# Patient Record
Sex: Male | Born: 1950 | Race: White | Hispanic: No | Marital: Married | Smoking: Former smoker
Health system: Southern US, Community
[De-identification: ages and names within clinical notes are randomized; demographics above are authoritative.]

## PROBLEM LIST (undated history)

## (undated) DIAGNOSIS — Z95820 Peripheral vascular angioplasty status with implants and grafts: Secondary | ICD-10-CM

## (undated) DIAGNOSIS — I739 Peripheral vascular disease, unspecified: Secondary | ICD-10-CM

## (undated) DIAGNOSIS — E785 Hyperlipidemia, unspecified: Secondary | ICD-10-CM

## (undated) DIAGNOSIS — I2510 Atherosclerotic heart disease of native coronary artery without angina pectoris: Secondary | ICD-10-CM

## (undated) DIAGNOSIS — I219 Acute myocardial infarction, unspecified: Secondary | ICD-10-CM

## (undated) DIAGNOSIS — I10 Essential (primary) hypertension: Secondary | ICD-10-CM

## (undated) HISTORY — DX: Atherosclerotic heart disease of native coronary artery without angina pectoris: I25.10

## (undated) HISTORY — DX: Peripheral vascular disease, unspecified: I73.9

## (undated) HISTORY — DX: Hyperlipidemia, unspecified: E78.5

---

## 2000-03-01 ENCOUNTER — Encounter: Payer: Self-pay | Admitting: Family Medicine

## 2000-03-01 ENCOUNTER — Encounter: Admission: RE | Admit: 2000-03-01 | Discharge: 2000-03-01 | Payer: Self-pay | Admitting: Family Medicine

## 2001-04-14 ENCOUNTER — Encounter: Payer: Self-pay | Admitting: Emergency Medicine

## 2001-04-14 ENCOUNTER — Emergency Department (HOSPITAL_COMMUNITY): Admission: EM | Admit: 2001-04-14 | Discharge: 2001-04-14 | Payer: Self-pay | Admitting: Emergency Medicine

## 2002-03-11 ENCOUNTER — Inpatient Hospital Stay (HOSPITAL_COMMUNITY): Admission: EM | Admit: 2002-03-11 | Discharge: 2002-03-14 | Payer: Self-pay | Admitting: Emergency Medicine

## 2002-03-11 ENCOUNTER — Encounter: Payer: Self-pay | Admitting: Cardiovascular Disease

## 2002-03-11 HISTORY — PX: CARDIAC CATHETERIZATION: SHX172

## 2002-09-19 ENCOUNTER — Emergency Department (HOSPITAL_COMMUNITY): Admission: EM | Admit: 2002-09-19 | Discharge: 2002-09-19 | Payer: Self-pay | Admitting: Emergency Medicine

## 2004-08-25 HISTORY — PX: OTHER SURGICAL HISTORY: SHX169

## 2006-10-24 ENCOUNTER — Emergency Department (HOSPITAL_COMMUNITY): Admission: EM | Admit: 2006-10-24 | Discharge: 2006-10-24 | Payer: Self-pay | Admitting: Emergency Medicine

## 2006-12-09 ENCOUNTER — Ambulatory Visit (HOSPITAL_COMMUNITY): Admission: RE | Admit: 2006-12-09 | Discharge: 2006-12-09 | Payer: Self-pay | Admitting: General Surgery

## 2006-12-09 ENCOUNTER — Encounter (INDEPENDENT_AMBULATORY_CARE_PROVIDER_SITE_OTHER): Payer: Self-pay | Admitting: Specialist

## 2007-06-13 ENCOUNTER — Emergency Department (HOSPITAL_COMMUNITY): Admission: EM | Admit: 2007-06-13 | Discharge: 2007-06-13 | Payer: Self-pay | Admitting: Emergency Medicine

## 2010-08-17 ENCOUNTER — Encounter: Payer: Self-pay | Admitting: Chiropractic Medicine

## 2010-12-09 NOTE — Op Note (Signed)
NAMESULLIVAN, JACUINDE              ACCOUNT NO.:  [id number]   MEDICAL RECORD NO.:  [id number]          PATIENT TYPE:  AMB   LOCATION:  DAY                          FACILITY:  [facility]   PHYSICIAN:  Timothy E. Earlene Plater, M.D. DATE OF BIRTH:  02-12-1951   DATE OF PROCEDURE:  12/09/2006  DATE OF DISCHARGE:                               OPERATIVE REPORT   PREOPERATIVE DIAGNOSIS:  Cholecystolithiasis.   POSTOPERATIVE DIAGNOSIS:  Cholecystolithiasis.   PROCEDURE:  Laparoscopic cholecystectomy and operative cholangiogram.   SURGEON:  Kendrick Ranch, M.D.   ASSISTANT:  Ovidio Kin, M.D.   ANESTHESIA:  General.   Ms. Grim is otherwise healthy 60 with symptomatic gallstones.  He  wishes to proceed with surgery after careful discussion.  He has had  stable cardiovascular disease.  The patient was seen, identified, and  the permit signed.   He is taken to the operating room, placed supine.  General endotracheal  anesthesia was administered.  The abdomen was clipped, prepped and  draped in the usual fashion.  Marcaine 0.25% with epinephrine was used  prior to each incision.  A vertical infraumbilical incision made.  The  fascia identified, opened the midline.  Peritoneum entered without  complications.  The Hasson catheter placed, tied in place with a #1  Vicryl across the fascia.  The abdomen was insufflated.  General  peritoneoscopy was unremarkable.  A second 10 mm trocar placed in the  midepigastrium, two 5 mm trocars in the right upper quadrant.  The  gallbladder was encased in omental fat and this was taken down bluntly  and carefully.  The gallbladder was grasped, placed on tension.  Its  anatomy could then be determined.  Dissection was carefully done around  the base of the gallbladder through this blanket of fatty tissue  revealing a otherwise normal-appearing cystic duct.  This was dissected  out with a clear window, clip was placed on the gallbladder side of  duct, the duct opened  and a percutaneously passed catheter was placed  into the duct and clipped.  A real-time cholangiogram was made showing  rapid emptying of dye into the duodenum and filling of the biliary tree  without complications.  The clip and catheter removed.  The stump of the  cystic duct triply clipped and divided.  Cystic artery identified,  dissected out, triply clipped and divided.  The gallbladder removed from  the gallbladder bed without complications.  The gallbladder placed in an  EndoCatch and removed through the infraumbilical incision which was tied  and closed under direct vision.  Copious irrigation was carried out, was  clear.  There was no evidence of bleeding or trouble.  All irrigant,  CO2, instruments and trocars removed under direct vision.  Each  wound checked cautery used when necessary and the skin wounds closed  with 3-0 Monocryl.  Steri-Strips applied.  Counts all correct.  The  patient tolerated it well and was removed to recovery room in good  condition.  He will be observed, discharged and followed as an  outpatient.      Timothy E. Earlene Plater, M.D.  Electronically Signed  TED/MEDQ  D:  12/09/2006  T:  12/09/2006  Job:  [id number]   cc:   Nanetta Batty, M.D.  Fax: [phone]   Dr. Theadora Rama

## 2010-12-12 NOTE — Cardiovascular Report (Signed)
NAME:  Jeffrey Norton, Jeffrey Norton NO.:  [id number]   MEDICAL RECORD NO.:  [id number]                   PATIENT TYPE:  INP   LOCATION:  1825                                 FACILITY:  [facility]   PHYSICIAN:  Runell Gess, M.D.             DATE OF BIRTH:  [birth year]   DATE OF PROCEDURE:  DATE OF DISCHARGE:                              CARDIAC CATHETERIZATION   PROCEDURE PERFORMED:  Cardiac catheterization/percutaneous coronary  intervention and stent procedure using AngioJet and temporary transvenous  pacing.   INDICATION:  The patient is a 60 year old married white male, father of four  children without prior cardiac history. He is on no medications. He had seen  Dr. Caprice Kluver approximately 10 years ago. He developed sudden onset of chest  pain one-hour prior to presentation in the [facility] Emergency Room when he  was shoveling mulch. Upon presentation, he had documentation of inferior ST  segment elevation. He was treated with aspirin, Plavix, IV heparin, and  nitroglycerin.  He remained hemodynamically stable but did drop his pressure  with the nitroglycerin. He was brought emergently to the cardiac  catheterization laboratory for angiography and intervention.   DESCRIPTION OF PROCEDURE:  The patient was brought to the second floor [facility] urgently  in the setting of an acute  myocardial infarction.  His right groin was prepped in the usual sterile  fashion.  Then 1% Xylocaine was used for local anesthesia.  A 7 French  sheath was inserted into the right femoral artery using standard Seldinger  technique. A 6 French sheath was inserted into the right femoral vein. A 5  French balloon-tipped temporary transvenous pacemaker was then placed in the  right ventricular outflow tract and capture was documented.  A 6 French  right and left  Judkins diagnostic catheter as well as a 6 French pigtail  catheter were used for  selective coronary angiography, left  ventriculography, and distal abdominal aortography.  Omnipaque dye was used  for the entirety of the case.  Retrograde, aortic, left ventricular, and  pullback pressures were recorded.   HEMODYNAMICS:  1. Aortic systolic pressure 110, diastolic pressure 67.  2. Left ventricular systolic pressure 119, diastolic pressure 19.   SELECTIVE CORONARY ANGIOGRAPHY:  1. Left main:  Normal.  2. Left anterior descending:  The LAD had a fairly focal 60% stenosis in the     midportion after the first diagonal branch.  3. Left circumflex:  This vessel gave off a high marginal branch that was     distally trifurcating in the ramus distribution. There was a 50% proximal     stenosis. It was nondominant.  4. Right coronary artery:  This is the infarcted related vessel and was     subtotally occluded with TIMI-1 to 1/2 flow. There was a long segmental     area of subtotal occlusion in the midportion.   LEFT  VENTRICULOGRAPHY:  RAO left ventriculogram was performed using 20 cc of  Omnipaque dye at 10 cc/sec.  There was moderate inferobasal and mild  anteroapical hypokinesia with compensatory anterior wall hyperkinesia and an  EF in the 50-55% range.   DISTAL ABDOMINAL AORTOGRAPHY:  Distal abdominal aortogram was performed  using  20 cc of Omnipaque dye at 20 cc/sec. x2.  There was a 40% proximal right  renal artery stenosis and a 40% proximal right common iliac artery stenosis.   IMPRESSION:  Acute posterior myocardial infarction with occluded dominant  rate and angiographically documented thrombosis.   PLAN:  We will proceed with PCI and stenting using adjunctive glycoprotein  IIb/IIIa inhibition and AngioJet.   PROCEDURE:  Using a 7 [place] JR4 with side holes guide catheter along with  an 0.014, 190 sport wire and a 2.75 x 15 Maverick balloon, pre-dilatation  was performed restoring antegrade flow with a reperfusion time of two hours.  There was obvious  thrombosis. The patient received a total of 6500 units of  heparin with an ending ACT of 280.  He received aspirin in the ER as well as  Plavix 300 p.o. and Integrilin in double bolus and infusion. The wire was  extended using a dock wire and AngioJet was performed with three passes. The  pacer kicked in.  The patient became hypotensive and bradycardic during the  AngioJet passes. This provided significant angiographic improvement with  thrombosis resolution. Following this, a 3.0 x 23 Cypher drug-eluting stent  was then deployed at 15 atmospheres.  There was some distal spasm relieved  with 200 mcg of intracoronary nitroglycerin. A 3.25 x 20 Quantum Maverick  balloon was then used for post-dilatation at 14 atmospheres resulting in  reduction in a subtotal occlusion to 0% residual with TIMI-3 flow. The  patient tolerated the procedure well.   OVERALL IMPRESSION:  Successful direct percutaneous coronary intervention  and stenting using glycoprotein IIb/IIIa inhibition and AngioJet with a  relief of perfusion time of two hours.   The guide wire and catheter were removed.  The sheaths were sewn securely in  place. The temporary transvenous pacemaker was removed.  Plans will be to  start beta blockers tomorrow. The sheaths will be removed once the ACT falls  below 150. Integrilin will be continued for 24 hours.  The patient will  ultimately be treated with ACE inhibitor, if blood pressure permitting, and  a statin.  He left the lab in stable condition with a stable blood pressure  and sinus rhythm.                                                       Runell Gess, M.D.    JJB/MEDQ  D:  03/11/2002  T:  03/14/2002  Job:  [id number]   cc:   Cardiac Catheterization Lab   Carolyne Fiscal, M.D.

## 2010-12-12 NOTE — H&P (Signed)
NAME:  Jeffrey Norton, Jeffrey Norton.:  [id number]   MEDICAL RECORD NO.:  [id number]                   PATIENT TYPE:   LOCATION:                                       FACILITY:   PHYSICIAN:  Abelino Derrick, P.A.                DATE OF BIRTH:   DATE OF ADMISSION:  03/11/2002  DATE OF DISCHARGE:                                HISTORY & PHYSICAL   CHIEF COMPLAINT:  Chest pain.   HISTORY OF PRESENT ILLNESS:  The patient is a 60 year old male with no prior  history of coronary artery disease or MI, who presents to the emergency room  with substernal chest tightness and an EKG consistent with an acute DMI.  He  had a remote exercise study by Dr. Clarene Duke 10 years ago.  The patient says  that he had symptoms at 11:30 a.m. while shoveling mulch. He was seen in the  emergency room at 12:30 p.m.  He was taken emergently to the catheterization  lab by Dr. [name].   PAST MEDICAL HISTORY:  Remarkable for borderline hypertension, he is on no  medications.  He has no history of diabetes and is unsure of his cholesterol  status.  He has had a remote tonsillectomy, but no other major surgeries or  medical problems.  He has had some diverticulitis and remote peptic ulcer  disease.  He currently takes no medications.   ALLERGIES:  No known drug allergies or shellfish allergy.   SOCIAL HISTORY:  He is an ex-smoker, he quit two years ago.  He is married  and has two children with no grandchildren.  He works in [employer] for [employer]  [employer].   FAMILY HISTORY:  Remarkable for coronary artery disease.  His father had an  MI, but died of cancer in his 66s.  His mother died also in her 58s of MI.  He has one older brother with no history of coronary artery disease.   REVIEW OF SYSTEMS:  Essentially unremarkable except as noted above, he has  not had any bleeding, kidney disease, or thyroid problems.   PHYSICAL EXAMINATION:  VITAL SIGNS:  Blood pressure 102/60, pulse 78,  respirations 16.  GENERAL:  He is a well-developed and well-nourished male in mild to moderate  discomfort.  He was examined in the emergency room.  HEENT:  Normocephalic.  Extraocular movements intact.  Sclerae are  nonicteric. Lids and conjunctivae are within normal limits.  NECK:  Without bruit.  No JVD.  CHEST:  Clear to auscultation and percussion.  HEART:  Regular rate and rhythm without murmurs, rubs, or gallops.  Normal  S1 and S2.  ABDOMEN:  Nontender with no hepatosplenomegaly appreciated.  It is not  distended.  EXTREMITIES: No edema.  Pulses were 2+/4 bilaterally.  NEUROLOGY:  Grossly intact.  He is awake, alert, oriented, and cooperative.  He moves all extremities without obvious deficit.  His EKG in the emergency  room revealed sinus rhythm with inferior ST elevation.   IMPRESSION:  1. Acute diaphragmatic myocardial infarction.  2. Past history of smoking.  3. History of borderline hypertension.  4. Family history of coronary artery disease.   PLAN:  The patient was taken emergently to the catheterization lab by Dr.  [name].  He is treated with aspirin, Plavix, heparin, and nitroglycerin.                                               Abelino Derrick, P.ALenard Lance  D:  03/13/2002  T:  03/15/2002  Job:  [id number]

## 2010-12-12 NOTE — Discharge Summary (Signed)
NAME:  Jeffrey, Norton                        ACCOUNT NO.:  [id number]   MEDICAL RECORD NO.:  [id number]                   PATIENT TYPE:  INP   LOCATION:  2922                                 FACILITY:  [facility]   PHYSICIAN:  Beverly A. Delanna Ahmadi, N.P.               DATE OF BIRTH:  [birth year]   DATE OF ADMISSION:  03/11/2002  DATE OF DISCHARGE:  03/14/2002                                 DISCHARGE SUMMARY   HISTORY OF PRESENT ILLNESS:  The patient is a 60 year old male patient  without a history of coronary artery disease. He came into the ER with  complaints of chest tightness starting while he was shoveling some mulch.  His EKG in the ER was consistent with acute DMI.  He was taken urgently to  the catheterization lab.  His other prior medical history includes  borderline hypertension. He quit smoking two years ago. He is married with  two children.  His initial blood pressure was 102/60, pulse was 80,  respirations were 16.   HOSPITAL COURSE:  He underwent cardiac catheterization by Dr. [name] on  March 11, 2002, emergently. He had an EF of 50 to 55%.  He had an RCA that  was 99% with thrombus and he had a 60% LAD mi and 50% circumflex.  He also  had 40% right renal artery stenosis and 40% iliac stenosis.  He underwent  AngioJet, PCI, stenting with a Cypher stent and Integrilin infusion.  He  became hypertensive, bradycardic, and required IV Atropine and dopamine  during the procedure.  Postprocedure, he did fairly well.  He was kept on  Integrilin for 24 hours.  Aspirin, Plavix, and beta blocker was started and  Altace ACE inhibitor was also started.  He did continue to have some  problems with intermittent sinus bradycardia and also low blood pressure.  His right groin was stable without any problems.  He was seen by cardiac  rehab and walked in the halls without any problems.  He did have one episode  of some triplet PVCs on March 12, 2002, and then none after that.  At the  time  of discharge, I reviewed his medications that he had received.  He  initially was put on Lopressor 25 mg b.i.d.  Apparently, he cannot tell that  he received any of this.  He was then changed to Toprol 50 mg a day.  He  received one dose and his heart rate went down to 49, thus, the morning of  his discharge, the nurse had held his Toprol. Thus we will change him to  Toprol XL 25 mg a day, also his Altace 2.5 mg was apparently started at once  a day and then increased to twice a day, however, he never received it twice  a day because of his blood pressure.   LABORATORY DATA:  Total cholesterol was 152, HDL 27, LDL 96, total  cholesterol  HDL ratio was 5.6, triglycerides 143.  These were performed on  March 12, 2002.  TSH was normal.  On March 13, 2002, hemoglobin 13.6,  hematocrit 39.9, WBC 11, platelets 212.  Sodium 144, potassium 4.5, BUN 11,  creatinine 1.1, and glucose 93.  CK-MB #1 was 1800/113; #2 was 1208/80.6; #3  was 787/31.8.  Troponin #1 6.3, #2 6.7, and #3 4.0.  Chest x-ray is not in  the computer at the time of this dictation.   DISCHARGE MEDICATIONS:  1. Aspirin 81 mg once a day.  2. Plavix 75 mg once a day x3 to 6 months.  3. Protonix 40 mg once per day.  4. Altace 2.5 mg once per day.  5. Zocor 10 mg once per day.  6. Nitroglycerin 0.4 mg if needed.  7. Toprol XL 25 mg once a day.   ACTIVITY:  He is to do no strenuous activity, no work until seen by Dr.  [name].  He should do no driving for a week.  He should be on a low  cholesterol diet.  If he has any problems with his groin, he will give our  office a call.  He will see Dr. [name] on September 4, at 3:30.  He will see  Abelino Derrick, P.A. on March 20, 2002, to see if he is stable to go on a  trip to [place].  Dr. [name] has stated that it was okay for him to go in  one weeks' time as long as he is stable when seen in the office.  His medications and discharge instructions were reviewed with him and his  wife.  Many questions were answered.  He will also be referred to the cardiac  rehab program.   DISCHARGE DIAGNOSES:  1. Acute inferior myocardial infarction, status post emergent     catheterization with AngioJet and stenting to his right coronary artery     with a Cypher stent, positive residual disease in his left anterior     descending 60% mid, and circumflex.  2. Dyslipidemia with low HDL.  He will need to be put on Niaspan as an     outpatient. Dietary measures to try to increase his HDL were discussed at     the time of discharge.  3. Borderline hypertension, now therapeutic on current medications.  4. Sinus bradycardia and hypotension after diaphragmatic myocardial     infarction.  5. History of peptic ulcer disease. The patient has not had a problem for     approximately five years.  He states when he does get discomfort, he     takes Carafate which he states is the only medication that works.  We     will keep him on Protonix as long as he is on the Plavix.                                               Beverly A. Delanna Ahmadi, N.P.    BAB/MEDQ  D:  03/14/2002  T:  03/14/2002  Job:  [id number]   cc:   Carolyne Fiscal, M.D.

## 2011-05-05 LAB — COMPREHENSIVE METABOLIC PANEL
ALT: 20
CO2: 24
Calcium: 9
Creatinine, Ser: 0.88
GFR calc non Af Amer: >60
Glucose, Bld: 96
Total Bilirubin: 1

## 2011-05-05 LAB — URINALYSIS, ROUTINE W REFLEX MICROSCOPIC
Nitrite: NEGATIVE
Protein, ur: NEGATIVE
Urobilinogen, UA: 0.2

## 2011-05-05 LAB — URINE MICROSCOPIC-ADD ON

## 2011-05-05 LAB — CBC
Hemoglobin: 16.6
MCHC: 35.4
MCV: 91.3
RBC: 5.14

## 2011-05-05 LAB — DIFFERENTIAL
Basophils Absolute: 0.1
Eosinophils Absolute: 0 — ABNORMAL LOW
Lymphs Abs: 0.8
Neutrophils Relative %: 89 — ABNORMAL HIGH

## 2011-05-05 LAB — CULTURE, BLOOD (ROUTINE X 2)

## 2011-05-05 LAB — INFLUENZA A+B VIRUS AG-DIRECT(RAPID)

## 2011-09-15 ENCOUNTER — Other Ambulatory Visit: Payer: Self-pay | Admitting: Chiropractic Medicine

## 2011-09-15 DIAGNOSIS — M545 Low back pain: Secondary | ICD-10-CM

## 2011-10-06 ENCOUNTER — Other Ambulatory Visit: Payer: Self-pay

## 2011-10-12 ENCOUNTER — Ambulatory Visit
Admission: RE | Admit: 2011-10-12 | Discharge: 2011-10-12 | Disposition: A | Discharge status: Home / Self Care | Payer: BC Managed Care – PPO | Source: Ambulatory Visit | Attending: Chiropractic Medicine | Admitting: Chiropractic Medicine

## 2011-10-12 DIAGNOSIS — M545 Low back pain: Secondary | ICD-10-CM

## 2011-10-22 ENCOUNTER — Other Ambulatory Visit: Payer: Self-pay | Admitting: Dermatology

## 2012-05-09 ENCOUNTER — Other Ambulatory Visit: Payer: Self-pay | Admitting: Physical Medicine and Rehabilitation

## 2012-05-09 DIAGNOSIS — M545 Low back pain: Secondary | ICD-10-CM

## 2012-06-03 ENCOUNTER — Ambulatory Visit
Admission: RE | Admit: 2012-06-03 | Discharge: 2012-06-03 | Disposition: A | Discharge status: Home / Self Care | Payer: Medicare HMO | Source: Ambulatory Visit | Attending: Physical Medicine and Rehabilitation | Admitting: Physical Medicine and Rehabilitation

## 2012-06-03 DIAGNOSIS — M545 Low back pain: Secondary | ICD-10-CM

## 2012-06-30 ENCOUNTER — Other Ambulatory Visit (HOSPITAL_COMMUNITY): Payer: Self-pay | Admitting: Cardiovascular Disease

## 2012-06-30 DIAGNOSIS — I2510 Atherosclerotic heart disease of native coronary artery without angina pectoris: Secondary | ICD-10-CM

## 2012-06-30 DIAGNOSIS — I739 Peripheral vascular disease, unspecified: Secondary | ICD-10-CM

## 2012-07-04 ENCOUNTER — Other Ambulatory Visit: Payer: Self-pay | Admitting: Cardiovascular Disease

## 2012-07-18 ENCOUNTER — Encounter (HOSPITAL_COMMUNITY): Payer: Self-pay | Admitting: Respiratory Therapy

## 2012-07-19 ENCOUNTER — Ambulatory Visit (HOSPITAL_COMMUNITY)
Admission: RE | Admit: 2012-07-19 | Discharge: 2012-07-19 | Disposition: A | Discharge status: Home / Self Care | Payer: Medicare HMO | Source: Ambulatory Visit | Attending: Cardiovascular Disease | Admitting: Cardiovascular Disease

## 2012-07-19 ENCOUNTER — Encounter (HOSPITAL_COMMUNITY): Payer: Medicare HMO

## 2012-07-19 DIAGNOSIS — R0609 Other forms of dyspnea: Secondary | ICD-10-CM | POA: Insufficient documentation

## 2012-07-19 DIAGNOSIS — I10 Essential (primary) hypertension: Secondary | ICD-10-CM | POA: Insufficient documentation

## 2012-07-19 DIAGNOSIS — Z8249 Family history of ischemic heart disease and other diseases of the circulatory system: Secondary | ICD-10-CM | POA: Insufficient documentation

## 2012-07-19 DIAGNOSIS — I2510 Atherosclerotic heart disease of native coronary artery without angina pectoris: Secondary | ICD-10-CM | POA: Insufficient documentation

## 2012-07-19 DIAGNOSIS — E785 Hyperlipidemia, unspecified: Secondary | ICD-10-CM | POA: Insufficient documentation

## 2012-07-19 DIAGNOSIS — R0989 Other specified symptoms and signs involving the circulatory and respiratory systems: Secondary | ICD-10-CM | POA: Insufficient documentation

## 2012-07-19 DIAGNOSIS — I252 Old myocardial infarction: Secondary | ICD-10-CM | POA: Insufficient documentation

## 2012-07-19 DIAGNOSIS — I739 Peripheral vascular disease, unspecified: Secondary | ICD-10-CM | POA: Insufficient documentation

## 2012-07-19 DIAGNOSIS — Z87891 Personal history of nicotine dependence: Secondary | ICD-10-CM | POA: Insufficient documentation

## 2012-07-19 HISTORY — PX: OTHER SURGICAL HISTORY: SHX169

## 2012-07-19 MED ORDER — AMINOPHYLLINE 25 MG/ML IV SOLN
75.0000 mg | Freq: Once | INTRAVENOUS | Status: AC
  Administered 2012-07-19: 75 mg via INTRAVENOUS

## 2012-07-19 MED ORDER — TECHNETIUM TC 99M SESTAMIBI GENERIC - CARDIOLITE
11.0000 | Freq: Once | INTRAVENOUS | Status: AC | PRN
  Administered 2012-07-19: 11 via INTRAVENOUS

## 2012-07-19 MED ORDER — TECHNETIUM TC 99M SESTAMIBI GENERIC - CARDIOLITE
32.0000 | Freq: Once | INTRAVENOUS | Status: AC | PRN
  Administered 2012-07-19: 32 via INTRAVENOUS

## 2012-07-19 MED ORDER — REGADENOSON 0.4 MG/5ML IV SOLN
0.4000 mg | Freq: Once | INTRAVENOUS | Status: AC
  Administered 2012-07-19: 0.4 mg via INTRAVENOUS

## 2012-07-19 NOTE — Procedures (Addendum)
[facility] [address] [place] [address] [phone]  Cardiology Nuclear Med Study  Jeffrey Norton is a 61 y.o. male     MRN : [id number]     DOB: 10/05/1950  Procedure Date: 07/19/2012  Nuclear Med Background Indication for Stress Test:  Stent Patency History:  CAD, PVD, MI 02/2002 Cardiac Risk Factors: Family History - CAD, History of Smoking, Hypertension and Lipids  Symptoms:  DOE   Nuclear Pre-Procedure Caffeine/Decaff Intake:  7:00pm NPO After: 5:00am   IV Site: R Antecubital  IV 0.9% NS with Angio Cath:  22g  Chest Size (in):  46 IV Started by: Koren Shiver, CNMT  Height: 5\' 9"  (1.753 m)  Cup Size: n/a  BMI:  Body mass index is 29.09 kg/(m^2). Weight:  197 lb (89.359 kg)   Tech Comments:  n/a    Nuclear Med Study 1 or 2 day study: 1 day  Stress Test Type:  Lexiscan  Order Authorizing Provider:  Nanetta Batty, MD   Resting Radionuclide: Technetium 38m Sestamibi  Resting Radionuclide Dose: 11.0 mCi   Stress Radionuclide:  Technetium 38m Sestamibi  Stress Radionuclide Dose: 32.0 mCi           Stress Protocol Rest HR: 65 Stress HR: 106  Rest BP: 211/86 Stress BP: 172/91  Exercise Time (min): n/a METS: n/a   Predicted Max HR: 159 bpm % Max HR: 66.67 bpm Rate Pressure Product: 46962   Dose of Adenosine (mg):  n/a Dose of Lexiscan: 0.4 mg  Dose of Atropine (mg): n/a Dose of Dobutamine: n/a mcg/kg/min (at max HR)  Stress Test Technologist: Esperanza Sheets, CCT Nuclear Technologist: Gonzella Lex, CNMT   Rest Procedure:  Myocardial perfusion imaging was performed at rest 45 minutes following the intravenous administration of Technetium 62m Sestamibi. Stress Procedure:  The patient received IV Lexiscan 0.4 mg over 15-seconds.  Technetium 20m Sestamibi injected at 30-seconds.  There were no significant changes with Lexiscan.  Quantitative spect images were obtained after a 45 minute delay.  Transient  Ischemic Dilatation (Normal <1.22):  1.04 Lung/Heart Ratio (Normal <0.45):  0.41 QGS EDV:  102 ml QGS ESV:  46 ml LV Ejection Fraction: 55%  Signed by      Rest ECG: NSR with non-specific ST-T wave changes  Stress ECG: No significant change from baseline ECG  QPS Raw Data Images:  Normal; no motion artifact; normal heart/lung ratio. Stress Images:  Moderate perfusion defect in the inferior wall and inferior septum Rest Images:  Comparison with the stress images reveals no significant change. Subtraction (SDS):  These findings are consistent with a small previous infarction in the territory of the right coronary artery. No evidence of reversible ischemia.  Impression Exercise Capacity:  Lexiscan with no exercise. BP Response:  Normal blood pressure response. Clinical Symptoms:  No significant symptoms noted. ECG Impression:  No significant ECG changes with Lexiscan. Comparison with Prior Nuclear Study: Direct image comparison shows no change.  Overall Impression:  Low risk stress nuclear study.  LV Wall Motion:  Mild inferobasal hypokinesis. Normal overall left ventricular systolic function.   Thurmon Fair, MD  07/19/2012 12:37 PM

## 2012-08-08 ENCOUNTER — Ambulatory Visit (HOSPITAL_COMMUNITY)
Admission: RE | Admit: 2012-08-08 | Discharge: 2012-08-08 | Disposition: A | Discharge status: Home / Self Care | Payer: Medicare HMO | Source: Ambulatory Visit | Attending: Cardiovascular Disease | Admitting: Cardiovascular Disease

## 2012-08-08 DIAGNOSIS — I739 Peripheral vascular disease, unspecified: Secondary | ICD-10-CM | POA: Insufficient documentation

## 2012-08-08 NOTE — Progress Notes (Signed)
BLE Arterial duplex doppler completed. Jeffrey Norton

## 2012-08-12 ENCOUNTER — Ambulatory Visit
Admission: RE | Admit: 2012-08-12 | Discharge: 2012-08-12 | Disposition: A | Discharge status: Home / Self Care | Payer: 59 | Source: Ambulatory Visit | Attending: Cardiovascular Disease | Admitting: Cardiovascular Disease

## 2012-08-12 ENCOUNTER — Other Ambulatory Visit: Payer: Self-pay | Admitting: Cardiovascular Disease

## 2012-08-12 DIAGNOSIS — Z01811 Encounter for preprocedural respiratory examination: Secondary | ICD-10-CM

## 2012-08-15 ENCOUNTER — Ambulatory Visit (HOSPITAL_COMMUNITY)
Admission: RE | Admit: 2012-08-15 | Discharge: 2012-08-16 | Disposition: A | Discharge status: Home / Self Care | Payer: Managed Care, Other (non HMO) | Source: Ambulatory Visit | Attending: Cardiovascular Disease | Admitting: Cardiovascular Disease

## 2012-08-15 ENCOUNTER — Other Ambulatory Visit (HOSPITAL_COMMUNITY): Payer: Self-pay | Admitting: Cardiology

## 2012-08-15 ENCOUNTER — Encounter (HOSPITAL_COMMUNITY): Payer: Self-pay | Admitting: Cardiology

## 2012-08-15 ENCOUNTER — Encounter (HOSPITAL_COMMUNITY)
Admission: RE | Disposition: A | Discharge status: Home / Self Care | Payer: Self-pay | Source: Ambulatory Visit | Attending: Cardiovascular Disease

## 2012-08-15 DIAGNOSIS — I771 Stricture of artery: Secondary | ICD-10-CM

## 2012-08-15 DIAGNOSIS — I708 Atherosclerosis of other arteries: Secondary | ICD-10-CM | POA: Insufficient documentation

## 2012-08-15 DIAGNOSIS — I2510 Atherosclerotic heart disease of native coronary artery without angina pectoris: Secondary | ICD-10-CM | POA: Diagnosis present

## 2012-08-15 DIAGNOSIS — I252 Old myocardial infarction: Secondary | ICD-10-CM | POA: Insufficient documentation

## 2012-08-15 DIAGNOSIS — I739 Peripheral vascular disease, unspecified: Secondary | ICD-10-CM | POA: Diagnosis present

## 2012-08-15 DIAGNOSIS — I70219 Atherosclerosis of native arteries of extremities with intermittent claudication, unspecified extremity: Secondary | ICD-10-CM | POA: Insufficient documentation

## 2012-08-15 DIAGNOSIS — Z95820 Peripheral vascular angioplasty status with implants and grafts: Secondary | ICD-10-CM

## 2012-08-15 DIAGNOSIS — I10 Essential (primary) hypertension: Secondary | ICD-10-CM | POA: Diagnosis present

## 2012-08-15 DIAGNOSIS — I701 Atherosclerosis of renal artery: Secondary | ICD-10-CM | POA: Diagnosis present

## 2012-08-15 HISTORY — DX: Essential (primary) hypertension: I10

## 2012-08-15 HISTORY — DX: Peripheral vascular angioplasty status with implants and grafts: Z95.820

## 2012-08-15 HISTORY — PX: OTHER SURGICAL HISTORY: SHX169

## 2012-08-15 HISTORY — DX: Acute myocardial infarction, unspecified: I21.9

## 2012-08-15 LAB — POCT ACTIVATED CLOTTING TIME
Activated Clotting Time: 268 seconds
Activated Clotting Time: 160 seconds

## 2012-08-15 SURGERY — ABDOMINAL ANGIOGRAM
Laterality: Bilateral

## 2012-08-15 MED ORDER — ASPIRIN 81 MG PO TABS: 81.0000 mg | ORAL_TABLET | Freq: Every day | ORAL | Status: DC

## 2012-08-15 MED ORDER — SODIUM CHLORIDE 0.9 % IV SOLN
INTRAVENOUS | Status: DC
  Administered 2012-08-15: 11:00:00 via INTRAVENOUS

## 2012-08-15 MED ORDER — SODIUM CHLORIDE 0.9 % IJ SOLN: 3.0000 mL | INTRAMUSCULAR | Status: DC | PRN

## 2012-08-15 MED ORDER — FENTANYL CITRATE 0.05 MG/ML IJ SOLN
INTRAMUSCULAR | Status: AC
  Filled 2012-08-15: qty 2

## 2012-08-15 MED ORDER — HEPARIN SODIUM (PORCINE) 1000 UNIT/ML IJ SOLN
INTRAMUSCULAR | Status: AC
  Filled 2012-08-15: qty 1

## 2012-08-15 MED ORDER — MIDAZOLAM HCL 2 MG/2ML IJ SOLN
INTRAMUSCULAR | Status: AC
  Filled 2012-08-15: qty 2

## 2012-08-15 MED ORDER — ALPRAZOLAM 0.25 MG PO TABS: 0.2500 mg | ORAL_TABLET | Freq: Three times a day (TID) | ORAL | Status: DC | PRN

## 2012-08-15 MED ORDER — LIDOCAINE HCL (PF) 1 % IJ SOLN
INTRAMUSCULAR | Status: AC
  Filled 2012-08-15: qty 30

## 2012-08-15 MED ORDER — SODIUM CHLORIDE 0.9 % IV SOLN
INTRAVENOUS | Status: AC
  Administered 2012-08-15: 15:00:00 via INTRAVENOUS

## 2012-08-15 MED ORDER — ONDANSETRON HCL 4 MG/2ML IJ SOLN: 4.0000 mg | Freq: Four times a day (QID) | INTRAMUSCULAR | Status: DC | PRN

## 2012-08-15 MED ORDER — ACETAMINOPHEN 325 MG PO TABS: 650.0000 mg | ORAL_TABLET | ORAL | Status: DC | PRN

## 2012-08-15 MED ORDER — DIAZEPAM 5 MG PO TABS
5.0000 mg | ORAL_TABLET | ORAL | Status: AC
  Administered 2012-08-15: 5 mg via ORAL

## 2012-08-15 MED ORDER — HYDRALAZINE HCL 20 MG/ML IJ SOLN
10.0000 mg | Freq: Four times a day (QID) | INTRAMUSCULAR | Status: DC | PRN
  Administered 2012-08-15: 16:00:00 10 mg via INTRAVENOUS
  Filled 2012-08-15: qty 0.5

## 2012-08-15 MED ORDER — HEPARIN (PORCINE) IN NACL 2-0.9 UNIT/ML-% IJ SOLN
INTRAMUSCULAR | Status: AC
  Filled 2012-08-15: qty 1000

## 2012-08-15 MED ORDER — DIAZEPAM 5 MG PO TABS
ORAL_TABLET | ORAL | Status: AC
  Filled 2012-08-15: qty 1

## 2012-08-15 MED ORDER — TRAMADOL HCL 50 MG PO TABS: 50.0000 mg | ORAL_TABLET | Freq: Four times a day (QID) | ORAL | Status: DC | PRN

## 2012-08-15 MED ORDER — ASPIRIN EC 325 MG PO TBEC
325.0000 mg | DELAYED_RELEASE_TABLET | Freq: Every day | ORAL | Status: DC
  Administered 2012-08-16: 10:00:00 325 mg via ORAL
  Filled 2012-08-15: qty 1

## 2012-08-15 MED ORDER — CLOPIDOGREL BISULFATE 75 MG PO TABS
75.0000 mg | ORAL_TABLET | Freq: Every day | ORAL | Status: DC
  Administered 2012-08-16: 75 mg via ORAL
  Filled 2012-08-15: qty 1

## 2012-08-15 MED ORDER — ZOLPIDEM TARTRATE 5 MG PO TABS: 5.0000 mg | ORAL_TABLET | Freq: Every evening | ORAL | Status: DC | PRN

## 2012-08-15 MED ORDER — MORPHINE SULFATE 2 MG/ML IJ SOLN
2.0000 mg | INTRAMUSCULAR | Status: DC | PRN
  Filled 2012-08-15: qty 1

## 2012-08-15 NOTE — CV Procedure (Signed)
Jeffrey Norton is a 62 y.o. male    [id number] LOCATION:  FACILITY: [facility]  PHYSICIAN: Nanetta Batty, M.D. [birth year]   DATE OF PROCEDURE:  08/15/2012  DATE OF DISCHARGE:  [facility]  PV Intervention    History obtained from chart review. Jeffrey Norton is a 62 year old married Caucasian male father of 2 who I just saw the office 06/29/2012. He was referred to me by Dr. Sheran Luz , a Physiatrist , for peripheral evaluation because of claudication and abnormal.Dopplers. His factors include remote tobacco abuse having quit 14 years ago, hypertension and hypokalemia. 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 I documented a 40% right renal artery stenosis at that time and a 40% right common iliac artery stenosis so he denied claudication. 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 He had a Myoview stress test was a nonischemic Dopplers the office that showed a right ABI 0.28 the left of 0.41 with monophasic waveforms. He presents now for abdominal aortography with bifemoral runoff potential endovascular therapy for lifestyle limiting claudication.   PROCEDURE DESCRIPTION:    The patient was brought to the second floor  [place] [facility] cath lab in the postabsorptive state. He was  premedicated with Valium 5 mg by mouth, IV Versed and fentanyl.Marland Kitchen His left groinwas prepped and shaved in usual sterile fashion. Xylocaine 1% was used for local anesthesia. A 5 French sheath was inserted into the left common femoral artery using standard Seldinger technique. The patient received  5000 units  of upper and  intravenously.  A 5 French pigtail catheter was used for abdominal aortography with bifemoral runoff using bolus chase digital subtraction step table technique. Visipaque dye was used  for the entirety of the case. Retrograde aortic, left ventricular and pullback pressures were recorded.    HEMODYNAMICS:    AO SYSTOLIC/AO DIASTOLIC: 179/71   ANGIOGRAPHIC RESULTS:   1: Abdominal aortogram-the renal arteries are widely patent. The infra-renal abdominal aorta is free of significant atherosclerotic changes.  2: Right lower extremity-the iliac artery (common, external ), and common femoral artery appeared occluded. There was faint reconstitution of the SFA which was patent down to the knee. The infrapopliteal vessels are not fully appreciated because of poor flow  3: Left lower extremity-there was a 95% stenosis in the proximal portion of the left external iliac artery. There was a 75% stenosis in the proximal left common femoral artery with a 100 mm gradient. The left SFA was occluded at its origin reconstituted in the adductor canal. There appeared  to be three-vessel runoff with a small peroneal.  IMPRESSION:Mr. Juarbe has an occluded right common and external iliac artery and high-grade left external iliac and common femoral stenoses. We will proceed with PTA and stenting of his left extrailiac and come in femoral artery and potential fem-fem crossover graft in is suitable landing zone is ascertained on the right side.  Procedure description: The 5 French sheath was exchanged over an 035 VersiCore wire for a 6 French Bright tip sheath. A total of 194 cc of contrast was administered to the patient. The ACT was measured at 268. The left external iliac artery was predilated with a 4 x 2 balloon. The scalp angiography was performed and resulted in a subintimal dissection of the left external iliac artery. Following this stenting  was performed with a 10 mm x 16 mm long Abbott Nitinol Absolute  Pro self-expanding stent. Overlap stenting was performed with a 8 mm x 80 mm long Abbott nitinol Absolute Pro self-expanding stent  extenting  down to the proximal left common femoral artery.  The entire second stent was dilated with a 6 mm x 60 mm long balloon to nominal pressures. There was no pressure gradient at the end of the case and the dissection plane was sealed. The patient tolerated the procedure well without hemodynamic or electrocardiographic sequelae.  Final impression: Successful PTA and stenting of the left external iliac and common femoral arteries per left relevant claudication. He right hip she was exchanged over a wire for a short 6 [place] sheath. The patient left the Cath Lab in stable condition. He was given 3 mg of by mouth Plavix. The sheath will be removed once he is to followup at 170 and pressure will be held. The patient will be hydrated overnight, discharged home in the morning on aspirin and Plavix. He'll get followup Doppler's office and will see me back.  Runell Gess MD, [facility] 08/15/2012 2:23 PM

## 2012-08-15 NOTE — Progress Notes (Signed)
Site area: left groin  Site Prior to Removal:  Level 0  Pressure Applied For 20  MINUTES    Minutes Beginning at 1700  Manual:   yes  Patient Status During Pull:  STABLE  Post Pull Groin Site:  Level 0  Post Pull Instructions Given:  yes  Post Pull Pulses Present:  yes  Dressing Applied:  yes  Comments:

## 2012-08-15 NOTE — H&P (Signed)
Jeffrey Norton is an 62 y.o. male.   Chief Complaint:  Claudication HPI:   The patient is a 62 yo male with a history of remote tobacco abuse, HTN, HLD, CAD with inferior wall MI in 2003. LAst NST was 2008 and nonischemic.  2D echo at that time revealed normal systolic function.  He also has a history of renal artery stenosis,  With 40% on the right and 40% iliac artery stenosis.  Recent ABIs were right-0.28 and left - 0.41.  He presents today for lower extremity angiogram with possible intervention.  He reports claudication particularly in the right leg.  He had a cold recent but it is resolving.  He denies N,V, fever, CP, SOB, ABD pain, LEE, orthopnea, dysuria, hematuria.  No past medical history on file.  No past surgical history on file.  No family history on file. Social History:  does not have a smoking history on file. He does not have any smokeless tobacco history on file. His alcohol and drug histories not on file.  Allergies: No Known Allergies  Medications Prior to Admission  Medication Sig Dispense Refill  . aspirin 81 MG tablet Take 81 mg by mouth daily.      . B Complex-C (B-COMPLEX WITH VITAMIN C) tablet Take 1 tablet by mouth daily.      . cholecalciferol (VITAMIN D) 1000 UNITS tablet Take 1,000 Units by mouth daily.      . penicillin v potassium (VEETID) 500 MG tablet Take 500 mg by mouth 4 (four) times daily. Take for 10 days.      Marland Kitchen VITAMIN E PO Take 1 capsule by mouth daily.        No results found for this or any previous visit (from the past 48 hour(s)). No results found.  Review of Systems  Constitutional: Negative for fever and diaphoresis.  HENT: Negative for congestion and sore throat.   Respiratory: Negative for cough and shortness of breath.   Cardiovascular: Positive for claudication. Negative for chest pain, palpitations, orthopnea, leg swelling and PND.  Gastrointestinal: Negative for nausea, vomiting, abdominal pain and blood in stool.  Genitourinary:  Negative for dysuria and hematuria.  Neurological: Negative for dizziness.    There were no vitals taken for this visit. Physical Exam  Constitutional: He is oriented to person, place, and time. He appears well-developed and well-nourished. No distress.  HENT:  Head: Normocephalic and atraumatic.  Eyes: EOM are normal. Pupils are equal, round, and reactive to light. No scleral icterus.  Neck: Normal range of motion. Neck supple.  Cardiovascular: Normal rate, regular rhythm, S1 normal and S2 normal.   No murmur heard. Pulses:      Radial pulses are 2+ on the right side, and 2+ on the left side.       Popliteal pulses are 1+ on the right side, and 0 on the left side.       Dorsalis pedis pulses are 1+ on the right side, and 1+ on the left side.       Posterior tibial pulses are 1+ on the right side, and 1+ on the left side.       No carotid bruits  Respiratory: Effort normal and breath sounds normal. He has no wheezes. He has no rales.  GI: Soft. Bowel sounds are normal. He exhibits no distension. There is no tenderness.  Musculoskeletal: He exhibits no edema.  Lymphadenopathy:    He has no cervical adenopathy.  Neurological: He is alert and oriented to  person, place, and time. He exhibits normal muscle tone.  Skin: Skin is warm and dry.  Psychiatric: He has a normal mood and affect.     Assessment/Plan Patient Active Hospital Problem List: Claudication of lower extremity (08/15/2012) HTN (hypertension) (08/15/2012) CAD (coronary artery disease): Inferior wall MI 08/2001. (08/15/2012) Renal artery stenosis:  40% right renal artery (08/15/2012) PVD (peripheral vascular disease): known 40% right common iliac (08/15/2012)  Plan: Lower extremity angiogram with possible intervention for life-style limiting claudication.   ,  08/15/2012, 11:23 AM

## 2012-08-16 ENCOUNTER — Other Ambulatory Visit (HOSPITAL_COMMUNITY): Payer: Self-pay | Admitting: Cardiology

## 2012-08-16 ENCOUNTER — Encounter (HOSPITAL_COMMUNITY): Payer: Self-pay | Admitting: Cardiovascular Disease

## 2012-08-16 DIAGNOSIS — I739 Peripheral vascular disease, unspecified: Secondary | ICD-10-CM

## 2012-08-16 LAB — BASIC METABOLIC PANEL
Chloride: 104 mEq/L (ref 96–112)
Creatinine, Ser: 0.88 mg/dL (ref 0.50–1.35)
GFR calc Af Amer: >90 mL/min (ref >90)

## 2012-08-16 LAB — CBC
HCT: 49.7 % (ref 39.0–52.0)
Platelets: 189 10*3/uL (ref 150–400)
RDW: 14 % (ref 11.5–15.5)
WBC: 12.8 10*3/uL — ABNORMAL HIGH (ref 4.0–10.5)

## 2012-08-16 MED ORDER — CLOPIDOGREL BISULFATE 75 MG PO TABS: 75.0000 mg | ORAL_TABLET | Freq: Every day | ORAL | Status: DC

## 2012-08-16 MED ORDER — SIMVASTATIN 20 MG PO TABS: 20.0000 mg | ORAL_TABLET | Freq: Every evening | ORAL | Status: DC

## 2012-08-16 MED ORDER — ACETAMINOPHEN 325 MG PO TABS: 650.0000 mg | ORAL_TABLET | ORAL | Status: DC | PRN

## 2012-08-16 NOTE — Progress Notes (Signed)
Subjective:  No complaints  Objective:  Vital Signs in the last 24 hours: Temp:  [96.7 F (35.9 C)-98.1 F (36.7 C)] 98 F (36.7 C) (01/21 1610) Pulse Rate:  [41-72] 72  (01/20 2101) Resp:  [4-20] 14  (01/21 0540) BP: (130-155)/(55-81) 135/55 mmHg (01/21 0540) SpO2:  [91 %-94 %] 92 % (01/21 0822) Weight:  [90.5 kg (199 lb 8.3 oz)] 90.5 kg (199 lb 8.3 oz) (01/21 0030)  Intake/Output from previous day:  Intake/Output Summary (Last 24 hours) at 08/16/12 9604 Last data filed at 08/15/12 2300  Gross per 24 hour  Intake    840 ml  Output      0 ml  Net    840 ml    Physical Exam: General appearance: alert, cooperative and no distress Lungs: rhonchi Rt base Heart: regular rate and rhythm Lt groin withour hematoma   Rate: 72  Rhythm: normal sinus rhythm  Lab Results:  Basename 08/16/12 0435  WBC 12.8*  HGB 16.9  PLT 189    Basename 08/16/12 0435  NA 139  K 4.3  CL 104  CO2 26  GLUCOSE 83  BUN 13  CREATININE 0.88   No results found for this basename: TROPONINI:2,CK,MB:2 in the last 72 hours Hepatic Function Panel No results found for this basename: PROT,ALBUMIN,AST,ALT,ALKPHOS,BILITOT,BILIDIR,IBILI in the last 72 hours No results found for this basename: CHOL in the last 72 hours No results found for this basename: INR in the last 72 hours  Imaging: Imaging results have been reviewed  Cardiac Studies:  Assessment/Plan:   Principal Problem:  *Claudication of lower extremity Active Problems:  PVD, LCIA, LSFA PTA 08/15/12, residual Rt disease  CAD (coronary artery disease): Inferior wall MI 08/2001, RCA PTA  HTN (hypertension)  Renal artery stenosis:  40% right renal artery  Plan- Discharge, add statin, follow up dopplers and OV with Dr [name].    Corine Shelter PA-C 08/16/2012, 8:23 AM    Agree with note written by Corine Shelter PAC  S/P PTA/Stent LCIA , LEIA and LCFA. Groin looks good. Exam benign. Labs OK. Plan D/C home today on ASA/Plavix. LEA and CTA  of pelvis focusing on RCFA for potential fem-fem x-over graft.   Runell Gess 08/16/2012 8:34 AM

## 2012-08-16 NOTE — Discharge Summary (Signed)
Patient ID: Jeffrey Norton,  MRN: [id number], DOB/AGE: 62-Jul-1952 62 y.o.  Admit date: 08/15/2012 Discharge date: 08/16/2012  Primary Care Provider:  Primary Cardiologist:  Dr [name]  Discharge Diagnoses  Principal Problem:  *Claudication of lower extremity  Active Problems:  PVD, LCIA, LSFA PTA 08/15/12, residual Rt disease  CAD (coronary artery disease): Inferior wall MI 08/2001, RCA PTA  HTN (hypertension)  Renal artery stenosis:  40% right renal artery    Procedures: PV angiogram and LCIA/LSFA stenting 08/15/12   Hospital Course:  62 y/o male with a history of CAD, S/P DMI Rx'd with RCA stenting in 2003. Recent OP Myoview was non ischemic.  He has had NL LVF in the past. He has been lost to follow since. He recently was seen by Dr [name] as an OP for claudication. He apparently had been having chronic back and hip pain. 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 ABIs as an OP were severely abnormal with an ABI of 0.28 on the RT and 0.41 on the LT. He was admitted 08/15/12 for PVA. This revealed an occluded right common and external iliac artery and high-grade left external iliac and common femoral artery. He underwent successful PTA and stenting of the left external iliac and common femoral arteries. He will need follow up CTA of the pelvis with focus on RCFA to see if there is a landing point for a fem-fem bypass graft. We did get him to agree to try a statin at discharge, he had been on this in the past. He will follow up with Dr [name] after the above tests are completed.    Discharge Vitals:  Blood pressure 135/55, pulse 72, temperature 98 F (36.7 C), temperature source Oral, resp. rate 14, height 5\' 9"  (1.753 m), weight 90.5 kg (199 lb 8.3 oz), SpO2 92.00%.    Labs: Results for orders placed during the hospital encounter of 08/15/12 (from the past 48 hour(s))  POCT ACTIVATED CLOTTING TIME     Status: Normal    Collection Time   08/15/12  1:36 PM      Component Value Range Comment   Activated Clotting Time 268     POCT ACTIVATED CLOTTING TIME     Status: Normal   Collection Time   08/15/12  3:36 PM      Component Value Range Comment   Activated Clotting Time 160     BASIC METABOLIC PANEL     Status: Normal   Collection Time   08/16/12  4:35 AM      Component Value Range Comment   Sodium 139  135 - 145 mEq/L    Potassium 4.3  3.5 - 5.1 mEq/L    Chloride 104  96 - 112 mEq/L    CO2 26  19 - 32 mEq/L    Glucose, Bld 83  70 - 99 mg/dL    BUN 13  6 - 23 mg/dL    Creatinine, Ser 4.69  0.50 - 1.35 mg/dL    Calcium 8.8  8.4 - 62.9 mg/dL    GFR calc non Af Amer >90  >90 mL/min    GFR calc Af Amer >90  >90 mL/min   CBC     Status: Abnormal   Collection Time   08/16/12  4:35 AM      Component Value Range Comment   WBC 12.8 (*) 4.0 - 10.5 K/uL    RBC  5.16  4.22 - 5.81 MIL/uL    Hemoglobin 16.9  13.0 - 17.0 g/dL    HCT 78.2  95.6 - 21.3 %    MCV 96.3  78.0 - 100.0 fL    MCH 32.8  26.0 - 34.0 pg    MCHC 34.0  30.0 - 36.0 g/dL    RDW 08.6  57.8 - 46.9 %    Platelets 189  150 - 400 K/uL     Disposition: OP CTA pelvis, OP LEA dopplers   Discharge Medications:    Medication List     As of 08/16/2012  9:30 AM    TAKE these medications         acetaminophen 325 MG tablet   Commonly known as: TYLENOL   Take 2 tablets (650 mg total) by mouth every 4 (four) hours as needed.      aspirin 81 MG tablet   Take 81 mg by mouth daily.      B-complex with vitamin C tablet   Take 1 tablet by mouth daily.      cholecalciferol 1000 UNITS tablet   Commonly known as: VITAMIN D   Take 1,000 Units by mouth daily.      clopidogrel 75 MG tablet   Commonly known as: PLAVIX   Take 1 tablet (75 mg total) by mouth daily with breakfast.      penicillin v potassium 500 MG tablet   Commonly known as: VEETID   Take 500 mg by mouth 4 (four) times daily. Take for 10 days.      simvastatin 20 MG tablet    Commonly known as: ZOCOR   Take 1 tablet (20 mg total) by mouth every evening.      VITAMIN E PO   Take 1 capsule by mouth daily.         Duration of Discharge Encounter: Greater than 30 minutes including physician time.  Jolene Provost PA-C 08/16/2012 9:30 AM

## 2012-08-16 NOTE — Progress Notes (Signed)
Utilization Review Completed  J. , RN, BSN, NCM [phone]  

## 2012-08-17 ENCOUNTER — Other Ambulatory Visit (HOSPITAL_COMMUNITY): Payer: Self-pay | Admitting: Cardiovascular Disease

## 2012-08-17 DIAGNOSIS — I739 Peripheral vascular disease, unspecified: Secondary | ICD-10-CM

## 2012-08-17 NOTE — Progress Notes (Signed)
Discharge concurrent Utilization Review Completed  J. , RN, BSN, NCM [phone]  

## 2012-08-22 ENCOUNTER — Ambulatory Visit (HOSPITAL_COMMUNITY): Payer: 59

## 2012-08-24 ENCOUNTER — Other Ambulatory Visit (HOSPITAL_COMMUNITY): Payer: Self-pay | Admitting: Cardiovascular Disease

## 2012-08-24 DIAGNOSIS — I739 Peripheral vascular disease, unspecified: Secondary | ICD-10-CM

## 2012-08-29 ENCOUNTER — Ambulatory Visit (HOSPITAL_COMMUNITY)
Admission: RE | Admit: 2012-08-29 | Discharge: 2012-08-29 | Disposition: A | Discharge status: Home / Self Care | Payer: 59 | Source: Ambulatory Visit | Attending: Cardiovascular Disease | Admitting: Cardiovascular Disease

## 2012-08-29 ENCOUNTER — Encounter (HOSPITAL_COMMUNITY): Payer: Self-pay

## 2012-08-29 ENCOUNTER — Ambulatory Visit (HOSPITAL_COMMUNITY)
Admission: RE | Admit: 2012-08-29 | Discharge: 2012-08-29 | Disposition: A | Discharge status: Home / Self Care | Payer: Managed Care, Other (non HMO) | Source: Ambulatory Visit | Attending: Cardiovascular Disease | Admitting: Cardiovascular Disease

## 2012-08-29 ENCOUNTER — Other Ambulatory Visit (HOSPITAL_COMMUNITY): Payer: Self-pay | Admitting: Cardiovascular Disease

## 2012-08-29 ENCOUNTER — Ambulatory Visit (HOSPITAL_COMMUNITY): Admission: RE | Admit: 2012-08-29 | Payer: Managed Care, Other (non HMO) | Source: Ambulatory Visit

## 2012-08-29 DIAGNOSIS — I745 Embolism and thrombosis of iliac artery: Secondary | ICD-10-CM | POA: Insufficient documentation

## 2012-08-29 DIAGNOSIS — I739 Peripheral vascular disease, unspecified: Secondary | ICD-10-CM

## 2012-08-29 DIAGNOSIS — I70299 Other atherosclerosis of native arteries of extremities, unspecified extremity: Secondary | ICD-10-CM | POA: Insufficient documentation

## 2012-08-29 HISTORY — PX: OTHER SURGICAL HISTORY: SHX169

## 2012-08-29 MED ORDER — IOHEXOL 350 MG/ML SOLN
150.0000 mL | Freq: Once | INTRAVENOUS | Status: AC | PRN
  Administered 2012-08-29: 150 mL via INTRAVENOUS

## 2012-08-29 NOTE — Progress Notes (Signed)
Left Lower extremity Duplex completed post stent.  Chauncy Lean

## 2012-11-21 ENCOUNTER — Encounter: Payer: Self-pay | Admitting: Cardiovascular Disease

## 2013-02-20 ENCOUNTER — Telehealth (HOSPITAL_COMMUNITY): Payer: Self-pay | Admitting: Cardiovascular Disease

## 2013-03-02 ENCOUNTER — Other Ambulatory Visit (HOSPITAL_COMMUNITY): Payer: Self-pay | Admitting: Cardiovascular Disease

## 2013-03-02 DIAGNOSIS — I270 Primary pulmonary hypertension: Secondary | ICD-10-CM

## 2013-03-24 ENCOUNTER — Ambulatory Visit (HOSPITAL_COMMUNITY)
Admission: RE | Admit: 2013-03-24 | Discharge: 2013-03-24 | Disposition: A | Discharge status: Home / Self Care | Payer: 59 | Source: Ambulatory Visit | Attending: Cardiovascular Disease | Admitting: Cardiovascular Disease

## 2013-03-24 DIAGNOSIS — I70219 Atherosclerosis of native arteries of extremities with intermittent claudication, unspecified extremity: Secondary | ICD-10-CM

## 2013-03-24 DIAGNOSIS — I270 Primary pulmonary hypertension: Secondary | ICD-10-CM

## 2013-03-24 DIAGNOSIS — I739 Peripheral vascular disease, unspecified: Secondary | ICD-10-CM

## 2013-03-24 NOTE — Progress Notes (Signed)
Arterial Duplex Lower Ext. Completed.  , BS, RDMS, RVT  

## 2013-04-03 ENCOUNTER — Encounter: Payer: Self-pay | Admitting: *Deleted

## 2013-04-03 ENCOUNTER — Telehealth: Payer: Self-pay | Admitting: *Deleted

## 2013-04-03 DIAGNOSIS — I739 Peripheral vascular disease, unspecified: Secondary | ICD-10-CM

## 2013-04-03 NOTE — Telephone Encounter (Signed)
Order placed for repeat lower ext dopplers in 6  months 

## 2013-04-03 NOTE — Telephone Encounter (Signed)
Message copied by Marella Bile on Mon Apr 03, 2013 10:45 PM ------      Message from: Runell Gess      Created: Fri Mar 31, 2013  2:45 PM       No change from prior study. Repeat in 6 months ------

## 2013-04-12 IMAGING — CT CT ANGIO AOBIFEM WO/W CM
1 of 12 series · 3 of 16 positions shown, 4 images · IV contrast (CONTRAST)
Comparison: Noninvasive examination from 06/03/2012

CLINICAL DATA: Right greater than left claudication with history of
left-sided iliac stent placement

CT ANGIOGRAPHY OF ABDOMINAL AORTA WITH ILIOFEMORAL RUNOFF
TECHNIQUE: Multidetector CT imaging of the abdomen, pelvis and
lower extremities was performed using the standard protocol during
bolus administration of intravenous contrast.  Multiplanar CT image
reconstructions including MIPs were obtained to evaluate the
vascular anatomy.
Contrast: 150mL OMNIPAQUE IOHEXOL 350 MG/ML SOLN

[Series 6: soft tissue · axial · 0.76mm/px · z∈[-1045,-347]mm · 3 of 699 slices shown, 4 images]
[im 175/699  soft-tissue]
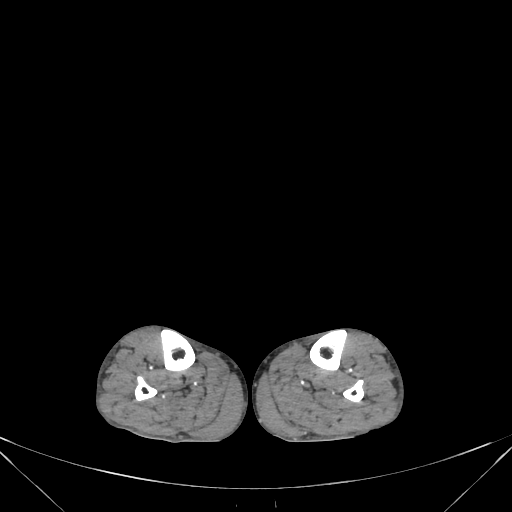
[im 175/699  bone]
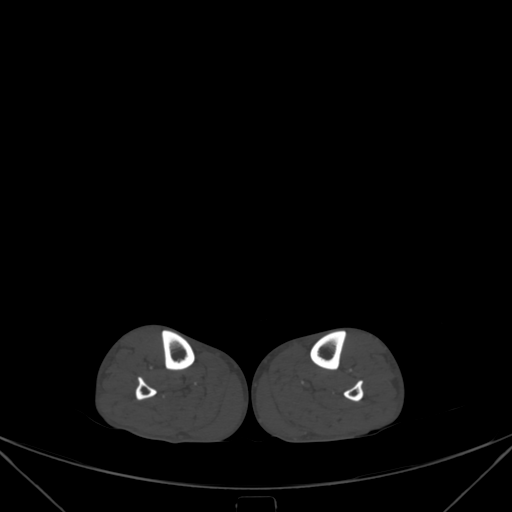
[im 350/699  soft-tissue]
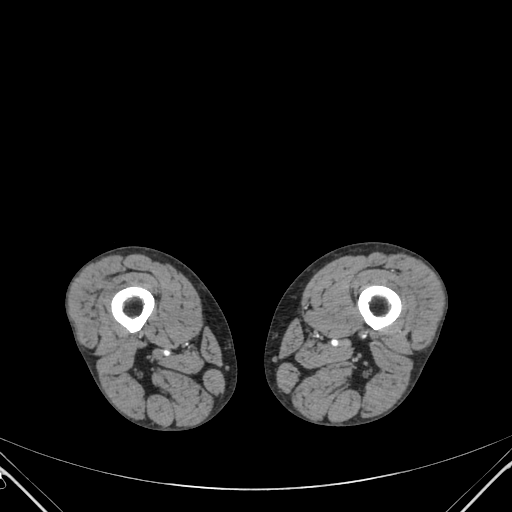
[im 524/699  soft-tissue]
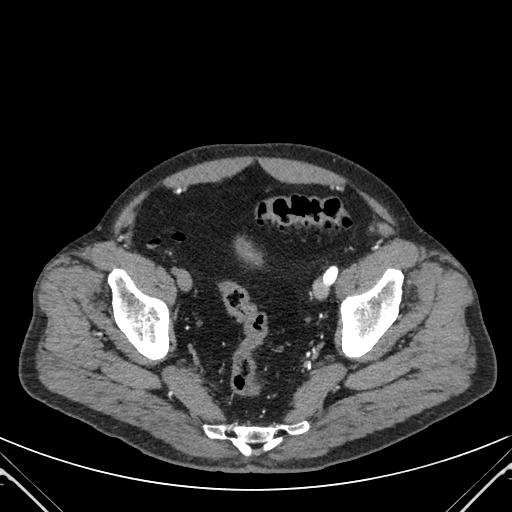

[3 of 16 positions shown; findings below may reference images not displayed]

FINDINGS: Aorta:  The abdominal aorta is well visualized and demonstrates
diffuse calcified and noncalcified atherosclerotic plaque.  No
aneurysmal dilatation is seen.  The celiac axis, superior
mesenteric artery and inferior mesenteric artery are patent. Dual
renal arteries are identified bilaterally.

Right Lower Extremity:  On the right, there is occlusion of the
right common iliac artery at its origin which extends to the level
of the common femoral artery on the right. The common femoral
artery demonstrates some calcific plaque without focal
hemodynamically significant stenosis.  The common femoral
bifurcation is patent.  The superficial femoral artery on the right
as well as the popliteal artery and popliteal trifurcation are
widely patent.  Three-vessel runoff to the right foot is noted.

Left Lower Extremity:  On the left, the left common iliac artery
demonstrates evidence of a stent which extends into the external
iliac artery.  Mild intrastent stenosis is seen although no focal
significant stenosis is noted.  The common femoral artery
demonstrates calcific plaque without focal stenosis.  A high-grade
stenosis of the left superficial femoral artery is noted just
beyond its origin.  The more distal left superficial femoral artery
is within normal limits.  Popliteal artery and popliteal
trifurcation are also widely patent with evidence of three-vessel
runoff to the level of the left foot.

 Review of the MIP images confirms the above findings.

The lung bases are free of acute infiltrate or sizable effusion.
The liver is diffusely fatty infiltrated.  Gallbladder appears to
have been surgically removed.  The spleen, left adrenal gland and
pancreas are normal on the CT appearance.  There is a 1 cm
hypodensity within the crux of the right adrenal gland this likely
representing a small adrenal adenoma.  The kidneys demonstrate
normal enhancement bilaterally.  Mild diverticular change of the
colon is seen. The appendix is within normal limits.  Bladder is
partially distended.  No pelvic mass lesion is noted.
IMPRESSION: Diffuse aortic atherosclerotic change without aneurysmal
dilatation.

Occlusion of the right common iliac artery which extends inferiorly
to the level of the proximal common femoral artery.  Reconstitution
via of the right inferior epigastric and deep circumflex iliac
arteries is noted.

Normal three-vessel runoff is noted on the right.

Patent left iliac stent as described.  A high-grade stenosis of the
left superficial femoral artery is noted proximally with normal
flow distally in the left lower extremity.

Likely right adrenal adenoma.

## 2013-04-17 ENCOUNTER — Ambulatory Visit: Payer: 59 | Admitting: Cardiology

## 2013-04-24 ENCOUNTER — Telehealth: Payer: Self-pay | Admitting: Cardiovascular Disease

## 2013-04-24 DIAGNOSIS — E782 Mixed hyperlipidemia: Secondary | ICD-10-CM

## 2013-04-24 DIAGNOSIS — Z79899 Other long term (current) drug therapy: Secondary | ICD-10-CM

## 2013-04-24 LAB — COMPREHENSIVE METABOLIC PANEL
Albumin: 4.2 g/dL (ref 3.5–5.2)
Alkaline Phosphatase: 87 U/L (ref 39–117)
BUN: 15 mg/dL (ref 6–23)
Creat: 1 mg/dL (ref 0.50–1.35)
Glucose, Bld: 90 mg/dL (ref 70–99)
Potassium: 4.8 mEq/L (ref 3.5–5.3)
Total Bilirubin: 0.6 mg/dL (ref 0.3–1.2)

## 2013-04-24 LAB — LIPID PANEL
HDL: 32 mg/dL — ABNORMAL LOW (ref >39)
LDL Cholesterol: 118 mg/dL — ABNORMAL HIGH (ref 0–99)
Total CHOL/HDL Ratio: 5.5 Ratio
Triglycerides: 133 mg/dL (ref <150)

## 2013-04-24 NOTE — Telephone Encounter (Signed)
Pt in lab w/o orders.  No orders available and pt not recently seen.  RN reviewed pt's las OV note and pt to see PA in 6 mo from that visit (tomorrow).  CMP and LP ordered.

## 2013-04-25 ENCOUNTER — Ambulatory Visit (INDEPENDENT_AMBULATORY_CARE_PROVIDER_SITE_OTHER): Payer: 59 | Admitting: Cardiology

## 2013-04-25 ENCOUNTER — Encounter: Payer: Self-pay | Admitting: Cardiology

## 2013-04-25 VITALS — BP 168/82 | HR 56 | Ht 69.0 in | Wt 193.1 lb

## 2013-04-25 DIAGNOSIS — I739 Peripheral vascular disease, unspecified: Secondary | ICD-10-CM

## 2013-04-25 DIAGNOSIS — I2510 Atherosclerotic heart disease of native coronary artery without angina pectoris: Secondary | ICD-10-CM

## 2013-04-25 DIAGNOSIS — E785 Hyperlipidemia, unspecified: Secondary | ICD-10-CM

## 2013-04-25 DIAGNOSIS — I10 Essential (primary) hypertension: Secondary | ICD-10-CM

## 2013-04-25 NOTE — Assessment & Plan Note (Signed)
ABIs slightly improved Aug 2014- 0.71 on Lt, 0.37 on Rt

## 2013-04-25 NOTE — Assessment & Plan Note (Signed)
LDL 118 

## 2013-04-25 NOTE — Progress Notes (Signed)
04/25/2013 Milagros Evener   09-Apr-1951  [id number]  Primary Physicia [name], MD Primary Cardiologist: Dr [name]  HPI:  62 y/o followed by Dr [name] with CAD and PVD. He had an RCA intervention in 2003. In Dec 2005 he had ISR and had a DES placed at [facility], he had been off Plavix when he was admitted. He has done well since. His last Myoview was 12/13 and was low risk. He has been reluctant to take medications although after his ISR in 2005 he does see the need for lifelong Plavix. He is here for a 6 month check. Recent lipid panel showed an LDL of 118. I had a 15 minute discussion with he and his wife about the potential benefit from statin therapy but at this time he declines. He may consider red yeast rice. We'll check lipids again in 6 months. He denies any chest pain and says he has no significant claudication.   Current Outpatient Prescriptions  Medication Sig Dispense Refill  . acetaminophen (TYLENOL) 325 MG tablet Take 2 tablets (650 mg total) by mouth every 4 (four) hours as needed.      Marland Kitchen aspirin 81 MG tablet Take 81 mg by mouth daily.      . B Complex-C (B-COMPLEX WITH VITAMIN C) tablet Take 1 tablet by mouth daily.      . cholecalciferol (VITAMIN D) 1000 UNITS tablet Take 1,000 Units by mouth daily.      . clopidogrel (PLAVIX) 75 MG tablet Take 1 tablet (75 mg total) by mouth daily with breakfast.  30 tablet  11  . VITAMIN E PO Take 1 capsule by mouth daily.       No current facility-administered medications for this visit.    No Known Allergies  History   Social History  . Marital Status: Married    Spouse Name: N/A    Number of Children: N/A  . Years of Education: N/A   Occupational History  . Not on file.   Social History Main Topics  . Smoking status: Former Smoker    Types: Cigarettes    Quit date: 08/16/1998  . Smokeless tobacco: Never Used  . Alcohol Use: No  . Drug Use: No  . Sexual Activity: Not on file   Other Topics Concern  . Not on file    Social History Narrative  . No narrative on file     Review of Systems: General: negative for chills, fever, night sweats or weight changes.  Cardiovascular: negative for chest pain, dyspnea on exertion, edema, orthopnea, palpitations, paroxysmal nocturnal dyspnea or shortness of breath Dermatological: negative for rash Respiratory: negative for cough or wheezing Urologic: negative for hematuria Abdominal: negative for nausea, vomiting, diarrhea, bright red blood per rectum, melena, or hematemesis Neurologic: negative for visual changes, syncope, or dizziness All other systems reviewed and are otherwise negative except as noted above.    Blood pressure 168/82, pulse 56, height 5\' 9"  (1.753 m), weight 193 lb 1.6 oz (87.59 kg).  General appearance: alert, cooperative and no distress Neck: no carotid bruit and no JVD Lungs: clear to auscultation bilaterally Heart: regular rate and rhythm Extremities: no edema, decreased distal pulses  EKG NSR/SB  ASSESSMENT AND PLAN:   CAD Inferior wall MI 08/2001, RCA PTA/DES with ISR - DES 12/05 at [facility] low risk 12/13 No angina  PVD, LCIA, LSFA PTA 08/15/12, residual Rt disease but minimal symptoms ABIs slightly improved Aug 2014- 0.71 on Lt, 0.37 on Rt  HTN (hypertension) .  Dyslipidemia LDL 118    PLAN  Lipids in 6 months, Dr [name] in 6 months, dopplers as scheduled.  , KPA-C 04/25/2013 9:25 AM

## 2013-04-25 NOTE — Assessment & Plan Note (Signed)
No angina 

## 2013-04-25 NOTE — Patient Instructions (Addendum)
Your physician recommends that you return for lab work in: 6 months. You will need to be fasting for this blood work.  Your physician wants you to follow-up in: 6 months with Dr. [name]. You will receive a reminder letter in the mail two months in advance. If you don't receive a letter, please call our office to schedule the follow-up appointment.

## 2013-09-14 ENCOUNTER — Ambulatory Visit (HOSPITAL_COMMUNITY)
Admission: RE | Admit: 2013-09-14 | Discharge: 2013-09-14 | Disposition: A | Discharge status: Home / Self Care | Payer: 59 | Source: Ambulatory Visit | Attending: Cardiovascular Disease | Admitting: Cardiovascular Disease

## 2013-09-14 DIAGNOSIS — I739 Peripheral vascular disease, unspecified: Secondary | ICD-10-CM

## 2013-09-14 DIAGNOSIS — I70229 Atherosclerosis of native arteries of extremities with rest pain, unspecified extremity: Secondary | ICD-10-CM | POA: Insufficient documentation

## 2013-09-14 NOTE — Progress Notes (Signed)
Lower Extremity Arterial Duplex Completed. °Brianna L Mazza,RVT °

## 2014-01-15 ENCOUNTER — Other Ambulatory Visit: Payer: Self-pay | Admitting: *Deleted

## 2014-01-15 MED ORDER — CLOPIDOGREL BISULFATE 75 MG PO TABS: 75.0000 mg | ORAL_TABLET | Freq: Every day | ORAL | Status: DC

## 2014-01-15 NOTE — Telephone Encounter (Signed)
Rx refill sent to patient pharmacy   

## 2014-02-23 ENCOUNTER — Telehealth (HOSPITAL_COMMUNITY): Payer: Self-pay | Admitting: *Deleted

## 2014-04-18 ENCOUNTER — Telehealth: Payer: Self-pay | Admitting: *Deleted

## 2014-04-18 NOTE — Telephone Encounter (Signed)
Dr [name] reviewed the chart and authorized patient to hold ASA and Plavix. Form filled out and faxed back.

## 2016-04-08 ENCOUNTER — Other Ambulatory Visit: Payer: 59

## 2016-04-08 ENCOUNTER — Other Ambulatory Visit: Payer: Self-pay | Admitting: Nurse Practitioner

## 2016-04-08 DIAGNOSIS — N50811 Right testicular pain: Secondary | ICD-10-CM

## 2016-04-09 ENCOUNTER — Ambulatory Visit
Admission: RE | Admit: 2016-04-09 | Discharge: 2016-04-09 | Disposition: A | Discharge status: Home / Self Care | Payer: Managed Care, Other (non HMO) | Source: Ambulatory Visit | Attending: Nurse Practitioner | Admitting: Nurse Practitioner

## 2016-04-09 DIAGNOSIS — N50811 Right testicular pain: Secondary | ICD-10-CM

## 2020-09-03 ENCOUNTER — Ambulatory Visit (INDEPENDENT_AMBULATORY_CARE_PROVIDER_SITE_OTHER): Payer: BC Managed Care – PPO | Admitting: Dermatology

## 2020-09-03 ENCOUNTER — Encounter: Payer: Self-pay | Admitting: Dermatology

## 2020-09-03 ENCOUNTER — Other Ambulatory Visit: Payer: Self-pay

## 2020-09-03 DIAGNOSIS — L720 Epidermal cyst: Secondary | ICD-10-CM

## 2020-09-03 DIAGNOSIS — L918 Other hypertrophic disorders of the skin: Secondary | ICD-10-CM

## 2020-09-03 DIAGNOSIS — L821 Other seborrheic keratosis: Secondary | ICD-10-CM

## 2020-09-13 ENCOUNTER — Encounter: Payer: Self-pay | Admitting: Dermatology

## 2020-09-13 NOTE — Progress Notes (Signed)
   Follow-Up Visit   Subjective  Jeffrey Norton is a 70 y.o. male who presents for the following: Cyst (Patient here today for cyst on right sideburn x 5 years.  Per patient no drainage, no pain).  General skin check Location: Cyst on right temple area Duration:  Quality:  Associated Signs/Symptoms: Modifying Factors:  Severity:  Timing: Context:   Objective  Well appearing patient in no apparent distress; mood and affect are within normal limits. Objective  Right Zygomatic Area: 2.5 cm deep dermal to subcutaneous solitary nonfluctuant nodule  Objective  Mid Back: 5 mm brown flattopped keratotic papule  Objective  Anterior Mid Neck: 2 mm pedunculated pink papule    All skin waist up examined.   Assessment & Plan    Epidermal cyst Right Zygomatic Area  Patient may choose to schedule excision.  Reviewed risks of scar including keloid, recurrence, and noncoverage by insurance.  Seborrheic keratosis Mid Back  Leave if stable.  Skin tag Anterior Mid Neck  Patient chooses to leave.      I, Janalyn Harder, MD, have reviewed all documentation for this visit.  The documentation on 09/13/20 for the exam, diagnosis, procedures, and orders are all accurate and complete.

## 2020-11-07 ENCOUNTER — Encounter: Payer: BC Managed Care – PPO | Admitting: Dermatology

## 2020-11-11 DIAGNOSIS — Z131 Encounter for screening for diabetes mellitus: Secondary | ICD-10-CM | POA: Diagnosis not present

## 2020-11-11 DIAGNOSIS — Z13220 Encounter for screening for lipoid disorders: Secondary | ICD-10-CM | POA: Diagnosis not present

## 2020-11-11 DIAGNOSIS — Z0000 Encounter for general adult medical examination without abnormal findings: Secondary | ICD-10-CM | POA: Diagnosis not present

## 2020-12-26 ENCOUNTER — Encounter: Payer: BC Managed Care – PPO | Admitting: Dermatology

## 2021-01-23 ENCOUNTER — Encounter: Payer: BC Managed Care – PPO | Admitting: Dermatology

## 2021-01-28 DIAGNOSIS — H2513 Age-related nuclear cataract, bilateral: Secondary | ICD-10-CM | POA: Diagnosis not present

## 2021-01-28 DIAGNOSIS — H4312 Vitreous hemorrhage, left eye: Secondary | ICD-10-CM | POA: Diagnosis not present

## 2021-01-28 DIAGNOSIS — H43812 Vitreous degeneration, left eye: Secondary | ICD-10-CM | POA: Diagnosis not present

## 2021-01-28 DIAGNOSIS — H43392 Other vitreous opacities, left eye: Secondary | ICD-10-CM | POA: Diagnosis not present

## 2021-01-28 DIAGNOSIS — H3562 Retinal hemorrhage, left eye: Secondary | ICD-10-CM | POA: Diagnosis not present

## 2021-01-30 DIAGNOSIS — H35052 Retinal neovascularization, unspecified, left eye: Secondary | ICD-10-CM | POA: Diagnosis not present

## 2021-01-30 DIAGNOSIS — H33312 Horseshoe tear of retina without detachment, left eye: Secondary | ICD-10-CM | POA: Diagnosis not present

## 2021-01-30 DIAGNOSIS — H4312 Vitreous hemorrhage, left eye: Secondary | ICD-10-CM | POA: Diagnosis not present

## 2021-01-31 DIAGNOSIS — H4312 Vitreous hemorrhage, left eye: Secondary | ICD-10-CM | POA: Diagnosis not present

## 2021-02-07 DIAGNOSIS — H4312 Vitreous hemorrhage, left eye: Secondary | ICD-10-CM | POA: Diagnosis not present

## 2021-02-07 DIAGNOSIS — H43391 Other vitreous opacities, right eye: Secondary | ICD-10-CM | POA: Diagnosis not present

## 2021-02-28 DIAGNOSIS — H31092 Other chorioretinal scars, left eye: Secondary | ICD-10-CM | POA: Diagnosis not present

## 2021-07-03 ENCOUNTER — Other Ambulatory Visit: Payer: Self-pay

## 2021-07-03 ENCOUNTER — Encounter: Payer: Self-pay | Admitting: Dermatology

## 2021-07-03 ENCOUNTER — Ambulatory Visit (INDEPENDENT_AMBULATORY_CARE_PROVIDER_SITE_OTHER): Payer: BC Managed Care – PPO | Admitting: Dermatology

## 2021-07-03 DIAGNOSIS — L720 Epidermal cyst: Secondary | ICD-10-CM | POA: Diagnosis not present

## 2021-07-03 DIAGNOSIS — L729 Follicular cyst of the skin and subcutaneous tissue, unspecified: Secondary | ICD-10-CM | POA: Diagnosis not present

## 2021-07-03 NOTE — Patient Instructions (Signed)

## 2021-07-09 ENCOUNTER — Ambulatory Visit (INDEPENDENT_AMBULATORY_CARE_PROVIDER_SITE_OTHER): Payer: BC Managed Care – PPO | Admitting: *Deleted

## 2021-07-09 ENCOUNTER — Other Ambulatory Visit: Payer: Self-pay

## 2021-07-09 DIAGNOSIS — Z4802 Encounter for removal of sutures: Secondary | ICD-10-CM

## 2021-07-09 NOTE — Progress Notes (Signed)
Here for NTS suture removal. No signs or symptoms of infection. Path to patient. 

## 2021-07-10 ENCOUNTER — Ambulatory Visit: Payer: BC Managed Care – PPO

## 2021-07-22 ENCOUNTER — Encounter: Payer: Self-pay | Admitting: Dermatology

## 2021-07-22 NOTE — Progress Notes (Signed)
° °  Follow-Up Visit   Subjective  Jeffrey Norton is a 70 y.o. male who presents for the following: Procedure (2 cm cyst on back).  Large cyst which has gotten larger in front of right ear Location:  Duration:  Quality:  Associated Signs/Symptoms: Modifying Factors:  Severity:  Timing: Context:   Objective  Well appearing patient in no apparent distress; mood and affect are within normal limits. Right Zygomatic Area 3+ centimeter noninflamed cyst right back cheek, preauricular.  Patient requests removal; and he understands risks of unsightly scar or recurrence of the cyst.    A focused examination was performed including head and neck. Relevant physical exam findings are noted in the Assessment and Plan.   Assessment & Plan    Cyst of skin Right Zygomatic Area  This was technically somewhat challenging, fair amount of fibrosis with adhesion of the cyst wall particularly inferiorly.  Cyst dissected out in pieces with visibly clear margins.  Layered closure.  Skin excision - Right Zygomatic Area  Lesion length (cm):  3.5 Lesion width (cm):  1 Margin per side (cm):  0 Total excision diameter (cm):  3.5 Informed consent: discussed and consent obtained   Timeout: patient name, date of birth, surgical site, and procedure verified   Instrument used: #15 blade   Hemostasis achieved with: suture and electrodesiccation   Outcome: patient tolerated procedure well with no complications   Post-procedure details: sterile dressing applied and wound care instructions given   Dressing type: petrolatum and pressure dressing   Additional details:  4-0 vic x 3 4-0 eth x 4  Skin repair - Right Zygomatic Area Complexity:  Intermediate Final length (cm):  3.5 Informed consent: discussed and consent obtained   Timeout: patient name, date of birth, surgical site, and procedure verified   Reason for type of repair: reduce tension to allow closure, reduce the risk of dehiscence,  infection, and necrosis, reduce subcutaneous dead space and avoid a hematoma and allow closure of the large defect   Undermining: edges could be approximated without difficulty and edges undermined   Subcutaneous layers (deep stitches):  Suture size:  5-0 Suture type: Vicryl (polyglactin 910)   Fine/surface layer approximation (top stitches):  Suture size:  5-0 Suture type: nylon    Specimen 1 - Surgical pathology Differential Diagnosis: cyst  Check Margins: No      I, Janalyn Harder, MD, have reviewed all documentation for this visit.  The documentation on 07/22/21 for the exam, diagnosis, procedures, and orders are all accurate and complete.

## 2021-07-29 ENCOUNTER — Telehealth: Payer: Self-pay | Admitting: Dermatology

## 2021-07-29 NOTE — Telephone Encounter (Signed)
Patient is calling to say that the site where Janalyn Harder, M.D. removed a cyst is draining and causing him some pain and wants to know what he should do.

## 2021-07-30 ENCOUNTER — Ambulatory Visit (INDEPENDENT_AMBULATORY_CARE_PROVIDER_SITE_OTHER): Payer: BC Managed Care – PPO | Admitting: Physician Assistant

## 2021-07-30 ENCOUNTER — Other Ambulatory Visit: Payer: Self-pay

## 2021-07-30 DIAGNOSIS — L089 Local infection of the skin and subcutaneous tissue, unspecified: Secondary | ICD-10-CM | POA: Diagnosis not present

## 2021-07-30 DIAGNOSIS — L080 Pyoderma: Secondary | ICD-10-CM | POA: Diagnosis not present

## 2021-07-30 DIAGNOSIS — L729 Follicular cyst of the skin and subcutaneous tissue, unspecified: Secondary | ICD-10-CM | POA: Diagnosis not present

## 2021-07-30 MED ORDER — DOXYCYCLINE HYCLATE 100 MG PO TABS: 100.0000 mg | ORAL_TABLET | Freq: Two times a day (BID) | ORAL | 0 refills | Status: DC

## 2021-07-30 NOTE — Telephone Encounter (Signed)
Patient called and worked in for a office visit today

## 2021-08-04 ENCOUNTER — Encounter: Payer: Self-pay | Admitting: Physician Assistant

## 2021-08-04 NOTE — Progress Notes (Signed)
° °  Follow-Up Visit   Subjective  ATHONY Norton is a 71 y.o. male who presents for the following: Wound Check (Patient here today for wound check on cyst that was removed by Dr. Jorja Loa x 3 weeks ago. Per patient the wound is draining and very tender. ).   The following portions of the chart were reviewed this encounter and updated as appropriate:  Tobacco   Allergies   Meds   Problems   Med Hx   Surg Hx   Fam Hx       Objective  Well appearing patient in no apparent distress; mood and affect are within normal limits.  A focused examination was performed including fart. Relevant physical exam findings are noted in the Assessment and Plan.  Right Zygomatic Area Yellow exudate drained from the linear wound.    Assessment & Plan  Infection of skin and subcutaneous tissue  Related Procedures Anaerobic and Aerobic Culture  Related Medications doxycycline (VIBRA-TABS) 100 MG tablet Take 1 tablet (100 mg total) by mouth 2 (two) times daily.  Pyoderma (skin infection) Right Zygomatic Area  doxycycline (VIBRA-TABS) 100 MG tablet - Right Zygomatic Area Take 1 tablet (100 mg total) by mouth 2 (two) times daily.  Anaerobic and Aerobic Culture - Right Zygomatic Area    I, ,, PA-C, have reviewed all documentation's for this visit.  The documentation on 08/04/21 for the exam, diagnosis, procedures and orders are all accurate and complete.

## 2021-08-05 ENCOUNTER — Telehealth: Payer: Self-pay | Admitting: *Deleted

## 2021-08-05 LAB — ANAEROBIC AND AEROBIC CULTURE
MICRO NUMBER:: 12826323
MICRO NUMBER:: 12826324
SPECIMEN QUALITY:: ADEQUATE
SPECIMEN QUALITY:: ADEQUATE

## 2021-08-05 NOTE — Telephone Encounter (Signed)
Bacteria results to patient- patient states lesion is healing okay now. Told to call us if needed.

## 2021-08-06 ENCOUNTER — Ambulatory Visit: Payer: BC Managed Care – PPO | Admitting: Physician Assistant

## 2022-03-26 DIAGNOSIS — Z0000 Encounter for general adult medical examination without abnormal findings: Secondary | ICD-10-CM | POA: Diagnosis not present

## 2022-03-26 DIAGNOSIS — R7303 Prediabetes: Secondary | ICD-10-CM | POA: Diagnosis not present

## 2022-03-26 DIAGNOSIS — R5383 Other fatigue: Secondary | ICD-10-CM | POA: Diagnosis not present

## 2022-03-26 DIAGNOSIS — E559 Vitamin D deficiency, unspecified: Secondary | ICD-10-CM | POA: Diagnosis not present

## 2022-07-07 ENCOUNTER — Inpatient Hospital Stay (HOSPITAL_COMMUNITY)
Admission: EM | Disposition: A | Discharge status: Home / Self Care | Payer: Self-pay | Source: Home / Self Care | Attending: Student

## 2022-07-07 ENCOUNTER — Emergency Department (HOSPITAL_COMMUNITY): Payer: BC Managed Care – PPO

## 2022-07-07 ENCOUNTER — Observation Stay (HOSPITAL_COMMUNITY)
Admission: EM | Admit: 2022-07-07 | Discharge: 2022-07-08 | Disposition: A | Discharge status: Home / Self Care | Payer: BC Managed Care – PPO | Attending: Cardiology | Admitting: Cardiology

## 2022-07-07 ENCOUNTER — Inpatient Hospital Stay (HOSPITAL_COMMUNITY): Payer: BC Managed Care – PPO

## 2022-07-07 ENCOUNTER — Encounter (HOSPITAL_COMMUNITY): Payer: Self-pay

## 2022-07-07 ENCOUNTER — Other Ambulatory Visit: Payer: Self-pay

## 2022-07-07 DIAGNOSIS — Z955 Presence of coronary angioplasty implant and graft: Secondary | ICD-10-CM | POA: Insufficient documentation

## 2022-07-07 DIAGNOSIS — I4892 Unspecified atrial flutter: Principal | ICD-10-CM | POA: Insufficient documentation

## 2022-07-07 DIAGNOSIS — J810 Acute pulmonary edema: Secondary | ICD-10-CM | POA: Diagnosis not present

## 2022-07-07 DIAGNOSIS — I214 Non-ST elevation (NSTEMI) myocardial infarction: Secondary | ICD-10-CM

## 2022-07-07 DIAGNOSIS — R079 Chest pain, unspecified: Principal | ICD-10-CM

## 2022-07-07 DIAGNOSIS — I2510 Atherosclerotic heart disease of native coronary artery without angina pectoris: Secondary | ICD-10-CM | POA: Diagnosis not present

## 2022-07-07 DIAGNOSIS — Z7982 Long term (current) use of aspirin: Secondary | ICD-10-CM | POA: Diagnosis not present

## 2022-07-07 DIAGNOSIS — I5020 Unspecified systolic (congestive) heart failure: Secondary | ICD-10-CM | POA: Insufficient documentation

## 2022-07-07 DIAGNOSIS — Z79899 Other long term (current) drug therapy: Secondary | ICD-10-CM | POA: Insufficient documentation

## 2022-07-07 DIAGNOSIS — I110 Hypertensive heart disease with heart failure: Secondary | ICD-10-CM | POA: Insufficient documentation

## 2022-07-07 DIAGNOSIS — E785 Hyperlipidemia, unspecified: Secondary | ICD-10-CM | POA: Diagnosis present

## 2022-07-07 DIAGNOSIS — Z87891 Personal history of nicotine dependence: Secondary | ICD-10-CM | POA: Insufficient documentation

## 2022-07-07 DIAGNOSIS — I739 Peripheral vascular disease, unspecified: Secondary | ICD-10-CM | POA: Diagnosis not present

## 2022-07-07 DIAGNOSIS — R0789 Other chest pain: Secondary | ICD-10-CM | POA: Diagnosis not present

## 2022-07-07 DIAGNOSIS — J90 Pleural effusion, not elsewhere classified: Secondary | ICD-10-CM | POA: Diagnosis not present

## 2022-07-07 DIAGNOSIS — I4891 Unspecified atrial fibrillation: Secondary | ICD-10-CM | POA: Insufficient documentation

## 2022-07-07 DIAGNOSIS — I10 Essential (primary) hypertension: Secondary | ICD-10-CM | POA: Diagnosis not present

## 2022-07-07 HISTORY — PX: LEFT HEART CATH AND CORONARY ANGIOGRAPHY: CATH118249

## 2022-07-07 LAB — POCT I-STAT, CHEM 8
BUN: 16 mg/dL (ref 8–23)
Calcium, Ion: 1.1 mmol/L — ABNORMAL LOW (ref 1.15–1.40)
Chloride: 105 mmol/L (ref 98–111)
Creatinine, Ser: 1 mg/dL (ref 0.61–1.24)
Glucose, Bld: 142 mg/dL — ABNORMAL HIGH (ref 70–99)
HCT: 54 % — ABNORMAL HIGH (ref 39.0–52.0)
Hemoglobin: 18.4 g/dL — ABNORMAL HIGH (ref 13.0–17.0)
Potassium: 3.9 mmol/L (ref 3.5–5.1)
Sodium: 142 mmol/L (ref 135–145)
TCO2: 22 mmol/L (ref 22–32)

## 2022-07-07 LAB — BASIC METABOLIC PANEL
Anion gap: 11 (ref 5–15)
BUN: 15 mg/dL (ref 8–23)
CO2: 26 mmol/L (ref 22–32)
Calcium: 9 mg/dL (ref 8.9–10.3)
Chloride: 102 mmol/L (ref 98–111)
Creatinine, Ser: 1.3 mg/dL — ABNORMAL HIGH (ref 0.61–1.24)
GFR, Estimated: 59 mL/min — ABNORMAL LOW (ref >60)
Glucose, Bld: 130 mg/dL — ABNORMAL HIGH (ref 70–99)
Potassium: 4.4 mmol/L (ref 3.5–5.1)
Sodium: 139 mmol/L (ref 135–145)

## 2022-07-07 LAB — HEPATIC FUNCTION PANEL
ALT: 14 U/L (ref 0–44)
AST: 18 U/L (ref 15–41)
Albumin: 2.8 g/dL — ABNORMAL LOW (ref 3.5–5.0)
Alkaline Phosphatase: 85 U/L (ref 38–126)
Bilirubin, Direct: 0.1 mg/dL (ref 0.0–0.2)
Indirect Bilirubin: 0.5 mg/dL (ref 0.3–0.9)
Total Bilirubin: 0.6 mg/dL (ref 0.3–1.2)
Total Protein: 7.4 g/dL (ref 6.5–8.1)

## 2022-07-07 LAB — CBC
HCT: 58.6 % — ABNORMAL HIGH (ref 39.0–52.0)
Hemoglobin: 19.5 g/dL — ABNORMAL HIGH (ref 13.0–17.0)
MCH: 31.3 pg (ref 26.0–34.0)
MCHC: 33.3 g/dL (ref 30.0–36.0)
MCV: 93.9 fL (ref 80.0–100.0)
Platelets: 224 10*3/uL (ref 150–400)
RBC: 6.24 MIL/uL — ABNORMAL HIGH (ref 4.22–5.81)
RDW: 14.3 % (ref 11.5–15.5)
WBC: 9.4 10*3/uL (ref 4.0–10.5)
nRBC: 0 % (ref 0.0–0.2)

## 2022-07-07 LAB — TROPONIN I (HIGH SENSITIVITY)
Troponin I (High Sensitivity): 238 ng/L (ref <18)
Troponin I (High Sensitivity): 204 ng/L (ref <18)

## 2022-07-07 SURGERY — LEFT HEART CATH AND CORONARY ANGIOGRAPHY
Anesthesia: LOCAL

## 2022-07-07 MED ORDER — LABETALOL HCL 5 MG/ML IV SOLN
INTRAVENOUS | Status: AC
  Filled 2022-07-07: qty 4

## 2022-07-07 MED ORDER — SODIUM CHLORIDE 0.9% FLUSH
3.0000 mL | Freq: Two times a day (BID) | INTRAVENOUS | Status: DC
  Administered 2022-07-08: 3 mL via INTRAVENOUS

## 2022-07-07 MED ORDER — FENTANYL CITRATE (PF) 100 MCG/2ML IJ SOLN
INTRAMUSCULAR | Status: AC
  Filled 2022-07-07: qty 2

## 2022-07-07 MED ORDER — AMLODIPINE BESYLATE 5 MG PO TABS
5.0000 mg | ORAL_TABLET | Freq: Every day | ORAL | Status: DC
  Administered 2022-07-07 – 2022-07-08 (×2): 5 mg via ORAL
  Filled 2022-07-07 (×2): qty 1

## 2022-07-07 MED ORDER — HYDRALAZINE HCL 20 MG/ML IJ SOLN
10.0000 mg | INTRAMUSCULAR | Status: AC | PRN
  Administered 2022-07-07 (×2): 10 mg via INTRAVENOUS

## 2022-07-07 MED ORDER — LIDOCAINE 5 % EX PTCH
1.0000 | MEDICATED_PATCH | CUTANEOUS | Status: DC
  Administered 2022-07-07: 1 via TRANSDERMAL
  Filled 2022-07-07: qty 1

## 2022-07-07 MED ORDER — NITROGLYCERIN 0.4 MG SL SUBL
0.4000 mg | SUBLINGUAL_TABLET | SUBLINGUAL | Status: DC | PRN
  Administered 2022-07-07: 0.4 mg via SUBLINGUAL
  Filled 2022-07-07 (×2): qty 1

## 2022-07-07 MED ORDER — HEPARIN (PORCINE) IN NACL 1000-0.9 UT/500ML-% IV SOLN
INTRAVENOUS | Status: AC
  Filled 2022-07-07: qty 500

## 2022-07-07 MED ORDER — VERAPAMIL HCL 2.5 MG/ML IV SOLN
INTRAVENOUS | Status: AC
  Filled 2022-07-07: qty 2

## 2022-07-07 MED ORDER — ROSUVASTATIN CALCIUM 20 MG PO TABS
40.0000 mg | ORAL_TABLET | Freq: Every day | ORAL | Status: DC
  Administered 2022-07-08: 40 mg via ORAL
  Filled 2022-07-07: qty 2

## 2022-07-07 MED ORDER — ASPIRIN 81 MG PO CHEW
CHEWABLE_TABLET | ORAL | Status: AC
  Filled 2022-07-07: qty 4

## 2022-07-07 MED ORDER — ACETAMINOPHEN 325 MG PO TABS: 650.0000 mg | ORAL_TABLET | ORAL | Status: DC | PRN

## 2022-07-07 MED ORDER — IOHEXOL 350 MG/ML SOLN
INTRAVENOUS | Status: DC | PRN
  Administered 2022-07-07: 135 mL

## 2022-07-07 MED ORDER — FENTANYL CITRATE (PF) 100 MCG/2ML IJ SOLN
INTRAMUSCULAR | Status: DC | PRN
  Administered 2022-07-07: 50 ug via INTRAVENOUS

## 2022-07-07 MED ORDER — HEPARIN SODIUM (PORCINE) 1000 UNIT/ML IJ SOLN
INTRAMUSCULAR | Status: AC
  Filled 2022-07-07: qty 10

## 2022-07-07 MED ORDER — HEPARIN SODIUM (PORCINE) 5000 UNIT/ML IJ SOLN: 4000.0000 [IU] | Freq: Once | INTRAMUSCULAR | Status: DC

## 2022-07-07 MED ORDER — HEPARIN SODIUM (PORCINE) 5000 UNIT/ML IJ SOLN
INTRAMUSCULAR | Status: AC
  Filled 2022-07-07: qty 1

## 2022-07-07 MED ORDER — ASPIRIN 81 MG PO CHEW
162.0000 mg | CHEWABLE_TABLET | Freq: Once | ORAL | Status: AC
  Administered 2022-07-07: 162 mg via ORAL
  Filled 2022-07-07: qty 2

## 2022-07-07 MED ORDER — HEPARIN (PORCINE) IN NACL 1000-0.9 UT/500ML-% IV SOLN
INTRAVENOUS | Status: DC | PRN
  Administered 2022-07-07 (×2): 500 mL

## 2022-07-07 MED ORDER — LIDOCAINE HCL (PF) 1 % IJ SOLN
INTRAMUSCULAR | Status: DC | PRN
  Administered 2022-07-07: 2 mL

## 2022-07-07 MED ORDER — ASPIRIN 81 MG PO CHEW
81.0000 mg | CHEWABLE_TABLET | Freq: Every day | ORAL | Status: DC
  Administered 2022-07-08: 81 mg via ORAL
  Filled 2022-07-07: qty 1

## 2022-07-07 MED ORDER — SODIUM CHLORIDE 0.9% FLUSH: 3.0000 mL | INTRAVENOUS | Status: DC | PRN

## 2022-07-07 MED ORDER — HEPARIN SODIUM (PORCINE) 5000 UNIT/ML IJ SOLN
INTRAMUSCULAR | Status: AC
  Administered 2022-07-07: 5000 [IU] via INTRAVENOUS
  Filled 2022-07-07: qty 1

## 2022-07-07 MED ORDER — HEPARIN (PORCINE) 25000 UT/250ML-% IV SOLN
1050.0000 [IU]/h | INTRAVENOUS | Status: DC
  Administered 2022-07-07: 1050 [IU]/h via INTRAVENOUS
  Filled 2022-07-07: qty 250

## 2022-07-07 MED ORDER — METOPROLOL TARTRATE 50 MG PO TABS
50.0000 mg | ORAL_TABLET | Freq: Two times a day (BID) | ORAL | Status: DC
  Administered 2022-07-07 – 2022-07-08 (×3): 50 mg via ORAL
  Filled 2022-07-07 (×3): qty 1

## 2022-07-07 MED ORDER — LIDOCAINE HCL (PF) 1 % IJ SOLN
INTRAMUSCULAR | Status: AC
  Filled 2022-07-07: qty 30

## 2022-07-07 MED ORDER — MORPHINE SULFATE (PF) 2 MG/ML IV SOLN
1.0000 mg | Freq: Four times a day (QID) | INTRAVENOUS | Status: DC | PRN
  Administered 2022-07-07: 1 mg via INTRAVENOUS
  Filled 2022-07-07: qty 1

## 2022-07-07 MED ORDER — NITROGLYCERIN 1 MG/10 ML FOR IR/CATH LAB
INTRA_ARTERIAL | Status: AC
  Filled 2022-07-07: qty 10

## 2022-07-07 MED ORDER — VERAPAMIL HCL 2.5 MG/ML IV SOLN
INTRAVENOUS | Status: DC | PRN
  Administered 2022-07-07: 10 mL via INTRA_ARTERIAL

## 2022-07-07 MED ORDER — ONDANSETRON HCL 4 MG/2ML IJ SOLN: 4.0000 mg | Freq: Four times a day (QID) | INTRAMUSCULAR | Status: DC | PRN

## 2022-07-07 MED ORDER — LABETALOL HCL 5 MG/ML IV SOLN
10.0000 mg | INTRAVENOUS | Status: AC | PRN
  Administered 2022-07-07 (×3): 10 mg via INTRAVENOUS

## 2022-07-07 MED ORDER — LABETALOL HCL 5 MG/ML IV SOLN
INTRAVENOUS | Status: DC | PRN
  Administered 2022-07-07: 20 mg via INTRAVENOUS

## 2022-07-07 MED ORDER — SODIUM CHLORIDE 0.9 % IV SOLN: 250.0000 mL | INTRAVENOUS | Status: DC | PRN

## 2022-07-07 MED ORDER — SODIUM CHLORIDE 0.9 % WEIGHT BASED INFUSION
1.0000 mL/kg/h | INTRAVENOUS | Status: AC
  Administered 2022-07-07: 1 mL/kg/h via INTRAVENOUS

## 2022-07-07 MED ORDER — HYDRALAZINE HCL 20 MG/ML IJ SOLN
INTRAMUSCULAR | Status: AC
  Filled 2022-07-07: qty 1

## 2022-07-07 SURGICAL SUPPLY — 16 items
CATH 5FR JL3.5 JR4 ANG PIG MP (CATHETERS) ×1 IMPLANT
CATH INFINITI 5 FR AR1 MOD (CATHETERS) ×1 IMPLANT
CATH LAUNCHER 6FR AL1 (CATHETERS) IMPLANT
CATHETER LAUNCHER 6FR AL1 (CATHETERS) ×1
DEVICE RAD COMP TR BAND LRG (VASCULAR PRODUCTS) ×1 IMPLANT
GLIDESHEATH SLEND SS 6F .021 (SHEATH) ×1 IMPLANT
GUIDEWIRE INQWIRE 1.5J.035X260 (WIRE) IMPLANT
INQWIRE 1.5J .035X260CM (WIRE) ×1
KIT ENCORE 26 ADVANTAGE (KITS) ×1 IMPLANT
KIT HEART LEFT (KITS) ×2 IMPLANT
PACK CARDIAC CATHETERIZATION (CUSTOM PROCEDURE TRAY) ×2 IMPLANT
SHEATH PROBE COVER 6X72 (BAG) IMPLANT
SYR MEDRAD MARK 7 150ML (SYRINGE) ×2 IMPLANT
TRANSDUCER W/STOPCOCK (MISCELLANEOUS) ×2 IMPLANT
TUBING CIL FLEX 10 FLL-RA (TUBING) ×2 IMPLANT
TUBING CONTRAST HIGH PRESS 20 (MISCELLANEOUS) ×1 IMPLANT

## 2022-07-07 NOTE — Progress Notes (Signed)
ANTICOAGULATION CONSULT NOTE - Initial Consult  Pharmacy Consult for Heparin Indication: atrial fibrillation  No Known Allergies  Patient Measurements: Height: 5' 9.02" (175.3 cm) Weight: 75.3 kg (166 lb 0.1 oz) IBW/kg (Calculated) : 70.74 Heparin Dosing Weight: 75 kg  Vital Signs: Temp: 98.4 F (36.9 C) (12/12 1927) Temp Source: Oral (12/12 1927) BP: 142/78 (12/12 2100) Pulse Rate: 60 (12/12 2100)  Labs: Recent Labs    07/07/22 1335 07/07/22 1421 07/07/22 1923  HGB 19.5* 18.4*  --   HCT 58.6* 54.0*  --   PLT 224  --   --   CREATININE 1.30* 1.00  --   TROPONINIHS 238*  --  204*    Estimated Creatinine Clearance: 67.8 mL/min (by C-G formula based on SCr of 1 mg/dL).   Medical History: Past Medical History:  Diagnosis Date   Coronary artery disease    Hyperlipidemia    Hypertension    Myocardial infarction [facility])    PVD (peripheral vascular disease) (HCC)    S/P angioplasty with stent to Lt. iliac 08/15/12 08/15/2012    Medications:  Medications Prior to Admission  Medication Sig Dispense Refill Last Dose   APPLE CIDER VINEGAR PO Take 1 capsule by mouth daily.   07/06/2022   ASCORBIC ACID PO Take 1 tablet by mouth daily. Vitamin C, unknown strength.   07/06/2022   aspirin 81 MG tablet Take 162 mg by mouth once as needed (chest pain).   07/07/2022   Cholecalciferol (VITAMIN D-3 PO) Take 1 capsule by mouth daily.   07/06/2022   Coenzyme Q10 (CO Q 10 PO) Take 1 capsule by mouth daily.   07/06/2022   OVER THE COUNTER MEDICATION Take 1 capsule by mouth daily. Black cumin seed oil, supplement.   07/06/2022   VITAMIN K PO Take 1 tablet by mouth daily.   07/06/2022   clopidogrel (PLAVIX) 75 MG tablet Take 1 tablet (75 mg total) by mouth daily with breakfast. Needs appointment for further refills (Patient not taking: Reported on 07/07/2022) 30 tablet 3 Not Taking    Assessment: 71 y.o. M presented with. Given 4000 units heparin in ED then taken to cath lab which showed  nonobstuctive CAD. Pt with rapid afib/flutter so to begin heparin infusion 2 hr post TR band removal. TR band removed at 2130. No AC PTA. CBC ok on admission. Noted likely home on DOAC.  Goal of Therapy:  Heparin level 0.3-0.7 units/ml Monitor platelets by anticoagulation protocol: Yes   Plan:  At 2330, start Heparin gtt at 1050 units/hr. No bolus Will f/u heparin level in 8 hours Daily heparin level and CBC F/u ability to transition to DOAC  Christoper Fabian, PharmD, BCPS Please see amion for complete clinical pharmacist phone list 07/07/2022,9:49 PM

## 2022-07-07 NOTE — H&P (Addendum)
Cardiology Admission History and Physical   Patient ID: Jeffrey Norton MRN: [id number]; DOB: May 22, 1951   Admission date: 07/07/2022  PCP:  Stephens Shire, MD   [facility] Cardiologist:  Previously seen by Dr. Gwenlyn Found but has not been seen since 2014.  Chief Complaint:  chest pain  Patient Profile:   Jeffrey Norton is a 71 y.o. male with a history of CAD with inferior STEMI in 02/2002 s/p DES with in-stent restenosis in 2005 s/p repeat PCI with DES, PAD s/p stenting of left external iliac artery in 07/2012, hypertension, hyperlipidemia, and remote tobacco use who presented to the ED on 07/07/2022 for further evaluation of chest pain and EKG was concerning for possible STEMI in anterior leads.  History of Present Illness:   Jeffrey Norton is a 71 year old male with known CAD and PAD as described above. He has not been seen by Cardiology since 2014 and has not seen his PCP in "a while." He was in his usual state of health until yesterday around lunch when he developed substernal chest pain. Pain persisted yesterday but was much more severe this morning when he woke up. He ranks it as 10/10 on the pain scale and describes it as an elephant sitting on his chest. It has been constant today. No radiation to back, neck, or arm. He has some associated shortness of breath with this but no associated nausea or diaphoresis. No other chest pain prior to yesterday. No orthopnea, PND, edema, palpitations, significant dizziness, or syncope. No recent fevers or illnesses. He states he has had a chronic cough since having COVID 2 years ago. No abnormal bleeding in urine or stools.  Upon arrival to the ED, he was hypertensive and tachycardic. EKG shows atrial flutter with 2:1 AV block and rate 136 bpm with ST elevation in anterior leads (possible LVH but could not rule out true STEMI). He was still having severe 10/10 chest pain. Therefore, decision was made to take him directly to the  cath lab for emergent cardiac catheterization. He took 2 baby Aspirin at home and then was given another 2 in the ED. He also received 5,000 unit of Heparin in the ED.  Of note, has a remote smoking history but quit 20 years ago around the time of his first MI. He does not take any routine medications home.  Past Medical History:  Diagnosis Date   Coronary artery disease    Hyperlipidemia    Hypertension    Myocardial infarction [facility])    PVD (peripheral vascular disease) ([place])    S/P angioplasty with stent to Lt. iliac 08/15/12 08/15/2012    Past Surgical History:  Procedure Laterality Date   2D Echocardiogram  08/25/2004   EF >55%, LA-moderately dilated.   CARDIAC CATHETERIZATION  03/11/2002   RCA subtotally occluded with TIMI-1 to1/2 flow stented with a 3x23 Cypher DES resulting in reduction of occlusion to 0% with TIMI-3 flow   LEA Doppler  08/29/2012   L SFA Proximal: 70-99% diameter reduction. Velocities suggest upper end of range-new finding when compared to previous study. L EIA stent: opne and patent without evidence of restenosis   Lexiscan Myoview  07/19/2012   No significant ECG changes, mild inferobasal hypokinesis with normal LV function, low risk stress nuclear study   PV Intervention  08/15/2012   Occluded L External Iliac-stented with a 10x3mm Abbott Nitinol Absolute Pro self-expanding stent. 75% stenosis in L Common Femoral artery stented witha 8x24mm lon Absolute Nitinol  Absolute Po self-expanding stent.     Medications Prior to Admission: Prior to Admission medications   Medication Sig Start Date End Date Taking? Authorizing Provider  acetaminophen (TYLENOL) 325 MG tablet Take 2 tablets (650 mg total) by mouth every 4 (four) hours as needed. 08/16/12   Erlene Quan, PA-C  aspirin 81 MG tablet Take 81 mg by mouth daily.    [provider]  B Complex-C (B-COMPLEX WITH VITAMIN C) tablet Take 1 tablet by mouth daily.    [provider]  cholecalciferol  (VITAMIN D) 1000 UNITS tablet Take 1,000 Units by mouth daily.    [provider]  clopidogrel (PLAVIX) 75 MG tablet Take 1 tablet (75 mg total) by mouth daily with breakfast. Needs appointment for further refills 01/15/14   Lorretta Harp, MD  doxycycline (VIBRA-TABS) 100 MG tablet Take 1 tablet (100 mg total) by mouth 2 (two) times daily. 07/30/21   [place], Ronalee Red, PA-C  VITAMIN E PO Take 1 capsule by mouth daily.    [provider]     Allergies:   No Known Allergies  Social History:   Social History   Socioeconomic History   Marital status: Married    Spouse name: Not on file   Number of children: Not on file   Years of education: Not on file   Highest education level: Not on file  Occupational History   Not on file  Tobacco Use   Smoking status: Former    Types: Cigarettes    Quit date: 08/16/1998    Years since quitting: 23.9   Smokeless tobacco: Never  Vaping Use   Vaping Use: Never used  Substance and Sexual Activity   Alcohol use: No   Drug use: No   Sexual activity: Not on file  Other Topics Concern   Not on file  Social History Narrative   Not on file   Social Determinants of Health   Financial Resource Strain: Not on file  Food Insecurity: Not on file  Transportation Needs: Not on file  Physical Activity: Not on file  Stress: Not on file  Social Connections: Not on file  Intimate Partner Violence: Not on file    Family History:   The patient's family history includes Cancer (age of onset: 81) in his father; Diabetes in his mother; Heart disease (age of onset: 35) in his father; Heart disease (age of onset: 60) in his mother; Hyperlipidemia in his mother; Kidney disease in his mother; Lung disease in his paternal grandfather; Stroke in his maternal grandfather.    ROS:  Please see the history of present illness.  Review of Systems  Constitutional:  Negative for fever.  HENT:  Negative for congestion.   Respiratory:  Positive for  cough and shortness of breath. Negative for sputum production.   Cardiovascular:  Positive for chest pain. Negative for palpitations, orthopnea, leg swelling and PND.  Gastrointestinal:  Negative for blood in stool, melena, nausea and vomiting.  Genitourinary:  Negative for hematuria.  Musculoskeletal:  Negative for myalgias.  Neurological:  Negative for loss of consciousness.  Endo/Heme/Allergies:  Does not bruise/bleed easily.  Psychiatric/Behavioral:  Negative for substance abuse (remote smoking history).    Physical Exam/Data:   Vitals:   07/07/22 1331 07/07/22 1332 07/07/22 1415  BP:  (!) 150/139   Pulse:  (!) 135   Resp:  20   Temp:  98.1 F (36.7 C)   TempSrc:  Oral   SpO2:  95% 94%  Weight: 79.4 kg    Height: 5\' 9"  (1.753 m)     No intake or output data in the 24 hours ending 07/07/22 1441    07/07/2022    1:31 PM 04/25/2013    8:15 AM 08/16/2012   12:30 AM  Last 3 Weights  Weight (lbs) 175 lb 193 lb 1.6 oz 199 lb 8.3 oz  Weight (kg) 79.379 kg 87.59 kg 90.5 kg     Body mass index is 25.84 kg/m.  General: 71 y.o. Caucasian male resting comfortably in no acute distress. HEENT: Normocephalic and atraumatic. Sclera clear.  Neck: Supple. No JVD. Heart: Tachycardic with regular rhythm.  No murmurs, gallops, or rubs. Radial pulses 2+ and equal bilaterally. Lungs: No increased work of breathing. Clear to ausculation bilaterally. No wheezes, rhonchi, or rales.  Abdomen: Soft, non-distended, and non-tender to palpation.  Extremities: No lower extremity edema.    Skin: Warm and dry. Neuro: Alert and oriented x3. No focal deficits. Psych: Normal affect. Responds appropriately.  EKG:  The ECG that was done was personally reviewed and demonstrates atrial flutter with 2:1 AV conduction, rate 136 bpm, with ST elevation in lead V2-V3.   Relevant CV Studies:   Laboratory Data:  High Sensitivity Troponin:   Recent Labs  Lab 07/07/22 1335  TROPONINIHS 238*       Chemistry Recent Labs  Lab 07/07/22 1335  NA 139  K 4.4  CL 102  CO2 26  GLUCOSE 130*  BUN 15  CREATININE 1.30*  CALCIUM 9.0  GFRNONAA 59*  ANIONGAP 11    No results for input(s): "PROT", "ALBUMIN", "AST", "ALT", "ALKPHOS", "BILITOT" in the last 168 hours. Lipids No results for input(s): "CHOL", "TRIG", "HDL", "LABVLDL", "LDLCALC", "CHOLHDL" in the last 168 hours. Hematology Recent Labs  Lab 07/07/22 1335  WBC 9.4  RBC 6.24*  HGB 19.5*  HCT 58.6*  MCV 93.9  MCH 31.3  MCHC 33.3  RDW 14.3  PLT 224   Thyroid No results for input(s): "TSH", "FREET4" in the last 168 hours. BNPNo results for input(s): "BNP", "PROBNP" in the last 168 hours.  DDimer No results for input(s): "DDIMER" in the last 168 hours.   Radiology/Studies:  No results found.   Assessment and Plan:   Possible STEMI Patient has a history of an acute inferior STEMI in 02/2002 s/p DES to RCA. He had in-stent restenosis of this in 2005 and required repeat PCI with DES to RCA. He has not been seen since 03/2013. He now presents with severe chest pain that he described as an elephant sitting on his chest. EKG shows atrial flutter with 2:1 AV conduction and ST elevation in V2-V3. ST elevation felt to possibly be due to LVH but given persistent 10/10 chest pain, decision was made to take patient directly to the cath lab for emergent cardiac catheterization. Initial high-sensitivity troponin came back at 238. - Will order fasting lipid panel, lipoprotein A, and hemoglobin A1c. - Will check Echo. - Further recommendations pending.  New Onset Paroxysmal Atrial Flutter Noted to be in atrial flutter with 2:1 AV conduction. Rates in the 130s.  - Will likely start beta-blocker and anticoagulation after cardiac catheterization.  Hypertension Initial BP 150/139. He has a history of hypertension but has not been taking any medications at home. - Will start antihypertensives after cardiac  catheterization.  Hyperlipidemia History of hyperlipidemia but has not been taking any medications.  - Will start Crestor 40mg  daily. - Will check fasting lipid panel.   PAD S/p  stenting of left external iliac artery in 07/2012.  - Decision regarding antiplatelet therapy to be made after cath. - Continue statin as above. - Previously followed by Dr. [name] but has not been seen since 03/2013. Recommend repeat lower extremity ultrasound and ABIs as an outpatient.   Risk Assessment/Risk Scores:   TIMI Risk Score for ST  Elevation MI:   The patient's TIMI risk score is 9, which indicates a 35.9% risk of all cause mortality at 30 days.{  CHA2DS2-VASc Score = 3  This indicates a 3.2% annual risk of stroke. The patient's score is based upon: CHF History: 0 HTN History: 1 Diabetes History: 0 Stroke History: 0 Vascular Disease History: 1 Age Score: 1 Gender Score: 0    Severity of Illness: The appropriate patient status for this patient is INPATIENT. Inpatient status is judged to be reasonable and necessary in order to provide the required intensity of service to ensure the patient's safety. The patient's presenting symptoms, physical exam findings, and initial radiographic and laboratory data in the context of their chronic comorbidities is felt to place them at high risk for further clinical deterioration. Furthermore, it is not anticipated that the patient will be medically stable for discharge from the hospital within 2 midnights of admission.   * I certify that at the point of admission it is my clinical judgment that the patient will require inpatient hospital care spanning beyond 2 midnights from the point of admission due to high intensity of service, high risk for further deterioration and high frequency of surveillance required.*   For questions or updates, please contact [facility] Please consult [email or web address] for contact info under     Signed, Corrin Parker,  PA-C  07/07/2022 2:41 PM

## 2022-07-07 NOTE — ED Provider Triage Note (Signed)
Emergency Medicine Provider Triage Evaluation Note  COPPER BASNETT , a 71 y.o. male  was evaluated in triage.  Pt complains of left-sided chest pain which began yesterday described as an elephant sitting on his chest.  Prior history of stents.  Does have established cardiology Dr. Gery Pray. Took baby aspiring 30 minutes prior to arrival still having symptoms. Also endorsing shortness of breath.  Review of Systems  Positive: SOB, Chest pain Negative: fever  Physical Exam  There were no vitals taken for this visit. Gen:   Awake, no distress   Resp:  Normal effort  MSK:   Moves extremities without difficulty  Other:  Lungs are clear, patient appears pretty uncomfortable.  Medical Decision Making  Medically screening exam initiated at 1:32 PM.  Appropriate orders placed.  Milagros Evener was informed that the remainder of the evaluation will be completed by another provider, this initial triage assessment does not replace that evaluation, and the importance of remaining in the ED until their evaluation is complete.  EKG concerning for STEMI.  Code STEMI activated by Dr. Rodena Medin.   Claude Manges, PA-C 07/07/22 1336

## 2022-07-07 NOTE — ED Triage Notes (Signed)
Pt reports central chest pain since yesterday with associated SOB, hx of stents, sees Dr [name]. Took 2 baby ASA today.

## 2022-07-07 NOTE — ED Provider Notes (Signed)
[facility] EMERGENCY DEPARTMENT Provider Note  CSN: [id number] Arrival date & time: 07/07/22 1326  Chief Complaint(s) Chest Pain  HPI Jeffrey Norton is a 71 y.o. male with PMH CAD status post MI with stent placement, HTN, HLD, peripheral arterial disease status post stent placement who presents emergency department for evaluation of chest pain and shortness of breath.  Patient states that pain began last night but acutely worsened this morning.  He describes the pain as a "elephant sitting on my chest" and is experiencing associated shortness of breath.  He took 2 baby aspirin's prior to arrival.  He states that today's pain feels very similar to his last STEMI.  Patient arrives tachycardic in the 130s and is uncomfortable appearing.  Denies abdominal pain, nausea, vomiting, headache, fever or other systemic symptoms.   Past Medical History Past Medical History:  Diagnosis Date   Coronary artery disease    Hyperlipidemia    Hypertension    Myocardial infarction [facility])    PVD (peripheral vascular disease) ([place])    S/P angioplasty with stent to Lt. 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 Authorizing Provider  acetaminophen (TYLENOL) 325 MG tablet Take 2 tablets (650 mg total) by mouth every 4 (four) hours as needed. 08/16/12   Erlene Quan, PA-C  aspirin 81 MG tablet Take 81 mg by mouth daily.    [provider]  B Complex-C (B-COMPLEX WITH VITAMIN C) tablet Take 1  tablet by mouth daily.    [provider]  cholecalciferol (VITAMIN D) 1000 UNITS tablet Take 1,000 Units by mouth daily.    [provider]  clopidogrel (PLAVIX) 75 MG tablet Take 1 tablet (75 mg total) by mouth daily with breakfast. Needs appointment for further refills 01/15/14   Lorretta Harp, MD  doxycycline (VIBRA-TABS) 100 MG tablet Take 1 tablet (100 mg total) by mouth 2 (two) times daily. 07/30/21   Sheffield, Ronalee Red, PA-C  VITAMIN E PO Take 1 capsule by mouth daily.    [provider]                                                                                                                                    Past Surgical History Past Surgical History:  Procedure Laterality Date   2D Echocardiogram  08/25/2004   EF >55%, LA-moderately dilated.   CARDIAC CATHETERIZATION  03/11/2002   RCA subtotally  occluded with TIMI-1 to1/2 flow stented with a 3x23 Cypher DES resulting in reduction of occlusion to 0% with TIMI-3 flow   LEA Doppler  08/29/2012   L SFA Proximal: 70-99% diameter reduction. Velocities suggest upper end of range-new finding when compared to previous study. L EIA stent: opne and patent without evidence of restenosis   Lexiscan Myoview  07/19/2012   No significant ECG changes, mild inferobasal hypokinesis with normal LV function, low risk stress nuclear study   PV Intervention  08/15/2012   Occluded L External Iliac-stented with a 10x44mm Abbott Nitinol Absolute Pro self-expanding stent. 75% stenosis in L Common Femoral artery stented witha 8x4mm lon Absolute Nitinol Absolute Po self-expanding stent.   Family History Family History  Problem Relation Age of Onset   Diabetes Mother    Heart disease Mother 65   Hyperlipidemia Mother    Kidney disease Mother    Heart disease Father 27   Cancer Father 68   Stroke Maternal Grandfather        Mid 80s   Lung disease Paternal Grandfather        Black Lung Disease    Social History Social  History   Tobacco Use   Smoking status: Former    Types: Cigarettes    Quit date: 08/16/1998    Years since quitting: 23.9   Smokeless tobacco: Never  Vaping Use   Vaping Use: Never used  Substance Use Topics   Alcohol use: No   Drug use: No   Allergies Patient has no known allergies.  Review of Systems Review of Systems  Respiratory:  Positive for shortness of breath.   Cardiovascular:  Positive for chest pain.    Physical Exam Vital Signs  I have reviewed the triage vital signs BP (!) 150/139   Pulse (!) 135   Temp 98.1 F (36.7 C) (Oral)   Resp 20   Ht 5\' 9"  (1.753 m)   Wt 79.4 kg   SpO2 95%   BMI 25.84 kg/m   Physical Exam Constitutional:      General: He is in acute distress.     Appearance: Normal appearance. He is ill-appearing.  HENT:     Head: Normocephalic and atraumatic.     Nose: No congestion or rhinorrhea.  Eyes:     General:        Right eye: No discharge.        Left eye: No discharge.     Extraocular Movements: Extraocular movements intact.     Pupils: Pupils are equal, round, and reactive to light.  Cardiovascular:     Rate and Rhythm: Tachycardia present. Rhythm irregular.     Heart sounds: No murmur heard. Pulmonary:     Effort: No respiratory distress.     Breath sounds: No wheezing or rales.  Abdominal:     General: There is no distension.     Tenderness: There is no abdominal tenderness.  Musculoskeletal:        General: Normal range of motion.     Cervical back: Normal range of motion.  Skin:    General: Skin is warm and dry.  Neurological:     General: No focal deficit present.     Mental Status: He is alert.     ED Results and Treatments Labs (all labs ordered are listed, but only abnormal results are displayed) Labs Reviewed  BASIC METABOLIC PANEL  CBC  TROPONIN I (HIGH SENSITIVITY)  Radiology No results found.  Pertinent labs & imaging results that were available during my care of the patient were reviewed by me and considered in my medical decision making (see MDM for details).  Medications Ordered in ED Medications  nitroGLYCERIN (NITROSTAT) SL tablet 0.4 mg (has no administration in time range)  aspirin chewable tablet 162 mg (has no administration in time range)  heparin 5000 UNIT/ML injection (has no administration in time range)                                                                                                                                     Procedures .Critical Care  Performed by: Glendora Score, MD Authorized by: Glendora Score, MD   Critical care provider statement:    Critical care time (minutes):  30   Critical care was necessary to treat or prevent imminent or life-threatening deterioration of the following conditions:  Cardiac failure   Critical care was time spent personally by me on the following activities:  Development of treatment plan with patient or surrogate, discussions with consultants, evaluation of patient's response to treatment, examination of patient, ordering and review of laboratory studies, ordering and review of radiographic studies, ordering and performing treatments and interventions, pulse oximetry, re-evaluation of patient's condition and review of old charts   (including critical care time)  Medical Decision Making / ED Course   This patient presents to the ED for concern of chest pain, shortness of breath, this involves an extensive number of treatment options, and is a complaint that carries with it a high risk of complications and morbidity.  The differential diagnosis includes ACS, a flutter, demand ischemia, CHF, PE  MDM: Patient seen emerged part for evaluation of chest pain shortness of breath.  Physical exam reveals an ill-appearing tachycardic patient but is otherwise unremarkable.  Laboratory  evaluation with a hemoglobin of 19.5, creatinine 1.3, initial high-sensitivity troponin elevated to 238.  STEMI alert was called from the lobby and I did speak with Dr. [name] who agrees that the ECG is not an obvious STEMI but patient's risk factors and current presentation are highly concerning and patient would likely benefit from urgent catheterization.  Patient received aspirin, heparin and nitroglycerin and went to the Cath Lab for further evaluation.   Additional history obtained: -Additional history obtained from wife -External records from outside source obtained and reviewed including: Chart review including previous notes, labs, imaging, consultation notes   Lab Tests: -I ordered, reviewed, and interpreted labs.   The pertinent results include:   Labs Reviewed  BASIC METABOLIC PANEL  CBC  TROPONIN I (HIGH SENSITIVITY)      EKG  Sinus tachycardia with PVCs versus a flutter, possible inferolateral STEMI     Medicines ordered and prescription drug management: Meds ordered this encounter  Medications   nitroGLYCERIN (NITROSTAT) SL tablet 0.4 mg   aspirin chewable tablet 162 mg   heparin 5000 UNIT/ML  injection    Luisa Hart G: cabinet override    -I have reviewed the patients home medicines and have made adjustments as needed  Critical interventions Aspirin, heparin, stat cardiology consultation and Cath Lab activation  Consultations Obtained: I requested consultation with the cardiologist Dr. Martinique,  and discussed lab and imaging findings as well as pertinent plan - they recommend: Cath Lab   Cardiac Monitoring: The patient was maintained on a cardiac monitor.  I personally viewed and interpreted the cardiac monitored which showed an underlying rhythm of: Sinus tachycardia  Social Determinants of Health:  Factors impacting patients care include: none   Reevaluation: After the interventions noted above, I reevaluated the patient and found that they have  :stayed the same  Co morbidities that complicate the patient evaluation  Past Medical History:  Diagnosis Date   Coronary artery disease    Hyperlipidemia    Hypertension    Myocardial infarction [facility])    PVD (peripheral vascular disease) ([place])    S/P angioplasty with stent to Lt. iliac 08/15/12 08/15/2012      Dispostion: I considered admission for this patient, and patient was admitted to the cardiology service and went to the Cath Lab     Final Clinical Impression(s) / ED Diagnoses Final diagnoses:  None     @PCDICTATION @    Teressa Lower, MD 07/07/22 [phone]

## 2022-07-08 ENCOUNTER — Inpatient Hospital Stay (HOSPITAL_BASED_OUTPATIENT_CLINIC_OR_DEPARTMENT_OTHER): Payer: BC Managed Care – PPO

## 2022-07-08 ENCOUNTER — Inpatient Hospital Stay (HOSPITAL_COMMUNITY): Payer: BC Managed Care – PPO

## 2022-07-08 ENCOUNTER — Other Ambulatory Visit (HOSPITAL_COMMUNITY): Payer: Self-pay

## 2022-07-08 DIAGNOSIS — I4892 Unspecified atrial flutter: Secondary | ICD-10-CM | POA: Diagnosis present

## 2022-07-08 DIAGNOSIS — J810 Acute pulmonary edema: Secondary | ICD-10-CM | POA: Diagnosis not present

## 2022-07-08 DIAGNOSIS — I25118 Atherosclerotic heart disease of native coronary artery with other forms of angina pectoris: Secondary | ICD-10-CM | POA: Diagnosis not present

## 2022-07-08 DIAGNOSIS — R079 Chest pain, unspecified: Secondary | ICD-10-CM | POA: Diagnosis not present

## 2022-07-08 DIAGNOSIS — I483 Typical atrial flutter: Secondary | ICD-10-CM | POA: Diagnosis not present

## 2022-07-08 DIAGNOSIS — I5020 Unspecified systolic (congestive) heart failure: Secondary | ICD-10-CM | POA: Insufficient documentation

## 2022-07-08 LAB — ECHOCARDIOGRAM COMPLETE
AR max vel: 2.44 cm2
AV Area VTI: 2.49 cm2
AV Area mean vel: 2.52 cm2
AV Mean grad: 4 mmHg
AV Peak grad: 8 mmHg
Ao pk vel: 1.41 m/s
Area-P 1/2: 3.54 cm2
Height: 69.016 in
S' Lateral: 4.9 cm
Weight: 2651.2 oz

## 2022-07-08 LAB — LIPID PANEL
Cholesterol: 107 mg/dL (ref 0–200)
HDL: 23 mg/dL — ABNORMAL LOW (ref >40)
LDL Cholesterol: 73 mg/dL (ref 0–99)
Total CHOL/HDL Ratio: 4.7 RATIO
Triglycerides: 56 mg/dL (ref <150)
VLDL: 11 mg/dL (ref 0–40)

## 2022-07-08 LAB — CBC
HCT: 50.4 % (ref 39.0–52.0)
Hemoglobin: 16.9 g/dL (ref 13.0–17.0)
MCH: 31.1 pg (ref 26.0–34.0)
MCHC: 33.5 g/dL (ref 30.0–36.0)
MCV: 92.6 fL (ref 80.0–100.0)
Platelets: 224 10*3/uL (ref 150–400)
RBC: 5.44 MIL/uL (ref 4.22–5.81)
RDW: 14.3 % (ref 11.5–15.5)
WBC: 12.1 10*3/uL — ABNORMAL HIGH (ref 4.0–10.5)
nRBC: 0 % (ref 0.0–0.2)

## 2022-07-08 LAB — HEPARIN LEVEL (UNFRACTIONATED): Heparin Unfractionated: <0.1 IU/mL — ABNORMAL LOW (ref 0.30–0.70)

## 2022-07-08 LAB — BASIC METABOLIC PANEL
Anion gap: 9 (ref 5–15)
BUN: 17 mg/dL (ref 8–23)
CO2: 21 mmol/L — ABNORMAL LOW (ref 22–32)
Calcium: 8.4 mg/dL — ABNORMAL LOW (ref 8.9–10.3)
Chloride: 106 mmol/L (ref 98–111)
Creatinine, Ser: 1.35 mg/dL — ABNORMAL HIGH (ref 0.61–1.24)
GFR, Estimated: 56 mL/min — ABNORMAL LOW (ref >60)
Glucose, Bld: 118 mg/dL — ABNORMAL HIGH (ref 70–99)
Potassium: 5 mmol/L (ref 3.5–5.1)
Sodium: 136 mmol/L (ref 135–145)

## 2022-07-08 MED ORDER — FUROSEMIDE 40 MG PO TABS
40.0000 mg | ORAL_TABLET | Freq: Every day | ORAL | 1 refills | Status: DC
  Filled 2022-07-08: qty 30, 30d supply, fill #0

## 2022-07-08 MED ORDER — ASPIRIN 81 MG PO TABS: 81.0000 mg | ORAL_TABLET | Freq: Every day | ORAL | Status: DC

## 2022-07-08 MED ORDER — FUROSEMIDE 10 MG/ML IJ SOLN
40.0000 mg | Freq: Once | INTRAMUSCULAR | Status: AC
  Administered 2022-07-08: 40 mg via INTRAVENOUS
  Filled 2022-07-08: qty 4

## 2022-07-08 MED ORDER — APIXABAN 5 MG PO TABS
5.0000 mg | ORAL_TABLET | Freq: Two times a day (BID) | ORAL | 1 refills | Status: DC
  Filled 2022-07-08: qty 60, 30d supply, fill #0

## 2022-07-08 MED ORDER — APIXABAN 5 MG PO TABS
5.0000 mg | ORAL_TABLET | Freq: Two times a day (BID) | ORAL | Status: DC
  Administered 2022-07-08: 5 mg via ORAL
  Filled 2022-07-08: qty 1

## 2022-07-08 MED ORDER — ROSUVASTATIN CALCIUM 40 MG PO TABS
40.0000 mg | ORAL_TABLET | Freq: Every day | ORAL | 1 refills | Status: DC
  Filled 2022-07-08: qty 30, 30d supply, fill #0

## 2022-07-08 MED ORDER — ORAL CARE MOUTH RINSE: 15.0000 mL | OROMUCOSAL | Status: DC | PRN

## 2022-07-08 MED ORDER — AMLODIPINE BESYLATE 5 MG PO TABS
5.0000 mg | ORAL_TABLET | Freq: Every day | ORAL | 0 refills | Status: DC
  Filled 2022-07-08: qty 30, 30d supply, fill #0

## 2022-07-08 MED ORDER — NITROGLYCERIN 0.4 MG SL SUBL
0.4000 mg | SUBLINGUAL_TABLET | SUBLINGUAL | 1 refills | Status: DC | PRN
  Filled 2022-07-08: qty 25, 7d supply, fill #0

## 2022-07-08 MED ORDER — METOPROLOL TARTRATE 50 MG PO TABS
50.0000 mg | ORAL_TABLET | Freq: Two times a day (BID) | ORAL | 1 refills | Status: DC
  Filled 2022-07-08: qty 60, 30d supply, fill #0

## 2022-07-08 MED FILL — Nitroglycerin IV Soln 100 MCG/ML in D5W: INTRA_ARTERIAL | Qty: 10 | Status: AC

## 2022-07-08 NOTE — TOC Benefit Eligibility Note (Signed)
Patient Product/process development scientist completed.    The patient is currently admitted and upon discharge could be taking Eliquis 5 mg.  The current 30 day co-pay is $0.00.   The patient is currently admitted and upon discharge could be taking Xarelto 20 mg.  The current 30 day co-pay is $0.00.   The patient is insured through [facility]   Roland Earl, CPHT Pharmacy Patient Advocate Specialist [facility] Patient Advocate Team Direct Number: [phone]  Fax: [phone]

## 2022-07-08 NOTE — Progress Notes (Signed)
2D echo attempted, but patient is with therapy. Will try later

## 2022-07-08 NOTE — Progress Notes (Signed)
ANTICOAGULATION CONSULT NOTE  Pharmacy Consult for Heparin Indication: atrial fibrillation  No Known Allergies  Patient Measurements: Height: 5' 9.02" (175.3 cm) Weight: 75.2 kg (165 lb 11.2 oz) IBW/kg (Calculated) : 70.74 Heparin Dosing Weight: 75 kg  Vital Signs: Temp: 98 F (36.7 C) (12/13 0600) Temp Source: Oral (12/13 0600) BP: 112/67 (12/13 0605) Pulse Rate: 53 (12/13 0605)  Labs: Recent Labs    07/07/22 1335 07/07/22 1421 07/07/22 1923 07/08/22 0054 07/08/22 0752  HGB 19.5* 18.4*  --  16.9  --   HCT 58.6* 54.0*  --  50.4  --   PLT 224  --   --  224  --   HEPARINUNFRC  --   --   --   --  <0.10*  CREATININE 1.30* 1.00  --  1.35*  --   TROPONINIHS 238*  --  204*  --   --      Estimated Creatinine Clearance: 50.2 mL/min (A) (by C-G formula based on SCr of 1.35 mg/dL (H)).   Medical History: Past Medical History:  Diagnosis Date   Coronary artery disease    Hyperlipidemia    Hypertension    Myocardial infarction [facility])    PVD (peripheral vascular disease) ([facility])    S/P angioplasty with stent to Lt. iliac 08/15/12 08/15/2012    Medications:  Medications Prior to Admission  Medication Sig Dispense Refill Last Dose   APPLE CIDER VINEGAR PO Take 1 capsule by mouth daily.   07/06/2022   ASCORBIC ACID PO Take 1 tablet by mouth daily. Vitamin C, unknown strength.   07/06/2022   aspirin 81 MG tablet Take 162 mg by mouth once as needed (chest pain).   07/07/2022   Cholecalciferol (VITAMIN D-3 PO) Take 1 capsule by mouth daily.   07/06/2022   Coenzyme Q10 (CO Q 10 PO) Take 1 capsule by mouth daily.   07/06/2022   OVER THE COUNTER MEDICATION Take 1 capsule by mouth daily. Black cumin seed oil, supplement.   07/06/2022   VITAMIN K PO Take 1 tablet by mouth daily.   07/06/2022   clopidogrel (PLAVIX) 75 MG tablet Take 1 tablet (75 mg total) by mouth daily with breakfast. Needs appointment for further refills (Patient not taking: Reported on 07/07/2022) 30 tablet 3 Not  Taking    Assessment: 70 y.o. M presented with. Given 4000 units heparin in ED then taken to cath lab which showed nonobstuctive CAD. Pt with rapid afib/flutter so to begin heparin infusion 2 hr post TR band removal. TR band removed at 2130. No AC PTA.   Heparin level this morning came back undetectable on heparin 1050 units/hr. CBC stable. No s/sx of bleeding or infusion issues.   Discussed with MD and plans to change to DOAC for Afib - gioven age<80, Scr<1.5, and wt>60 kg qualifies for full dose apixaban.   Goal of Therapy:  Heparin level 0.3-0.7 units/ml Monitor platelets by anticoagulation protocol: Yes   Plan:  Stop heparin infusion Start apixaban 5 mg BID  Monitor CBC, s/sx of bleeding  Sherron Monday, PharmD, BCCCP Clinical Pharmacist  Phone: [phone] 07/08/2022 10:04 AM  Please check AMION for all [facility] Pharmacy phone numbers After 10:00 PM, call Main Pharmacy [phone]

## 2022-07-08 NOTE — Progress Notes (Signed)
Rounding Note    Patient Name: Jeffrey Norton Date of Encounter: 07/08/2022  [place] Cardiologist: Martinique  Subjective   No complaints. Chest pain resolved.   Inpatient Medications    Scheduled Meds:  amLODipine  5 mg Oral Daily   aspirin  81 mg Oral Daily   lidocaine  1 patch Transdermal Q24H   metoprolol tartrate  50 mg Oral BID   rosuvastatin  40 mg Oral Daily   sodium chloride flush  3 mL Intravenous Q12H   Continuous Infusions:  sodium chloride     heparin 1,050 Units/hr (07/07/22 2331)   PRN Meds: sodium chloride, acetaminophen, morphine injection, nitroGLYCERIN, ondansetron (ZOFRAN) IV, mouth rinse, sodium chloride flush   Vital Signs    Vitals:   07/08/22 0600 07/08/22 0605 07/08/22 0606 07/08/22 0900  BP: (!) 196/149 112/67  (!) 140/72  Pulse:  (!) 53  (!) 57  Resp:  (!) 22  20  Temp: 98 F (36.7 C)   97.8 F (36.6 C)  TempSrc: Oral   Oral  SpO2:  (!) 86% 93% 94%  Weight:      Height:        Intake/Output Summary (Last 24 hours) at 07/08/2022 0934 Last data filed at 07/08/2022 0900 Gross per 24 hour  Intake 1097.7 ml  Output 600 ml  Net 497.7 ml      07/08/2022    5:00 AM 07/08/2022    1:20 AM 07/07/2022    7:00 PM  Last 3 Weights  Weight (lbs) 165 lb 11.2 oz 166 lb 0.1 oz 166 lb 0.1 oz  Weight (kg) 75.161 kg 75.3 kg 75.3 kg      Telemetry    Sinus brady - Personally Reviewed  ECG    No am ekg - Personally Reviewed  Physical Exam   GEN: No acute distress.   Neck: No JVD Cardiac: Regular, brady. no murmurs, rubs, or gallops.  Respiratory: left basilar crackles.  GI: Soft, nontender, non-distended  MS: No edema; No deformity. Neuro:  Nonfocal  Psych: Normal affect   Labs    High Sensitivity Troponin:   Recent Labs  Lab 07/07/22 1335 07/07/22 1923  TROPONINIHS 238* 204*     Chemistry Recent Labs  Lab 07/07/22 1335 07/07/22 1421 07/07/22 1923 07/08/22 0054  NA 139 142  --  136  K 4.4 3.9  --   5.0  CL 102 105  --  106  CO2 26  --   --  21*  GLUCOSE 130* 142*  --  118*  BUN 15 16  --  17  CREATININE 1.30* 1.00  --  1.35*  CALCIUM 9.0  --   --  8.4*  PROT  --   --  7.4  --   ALBUMIN  --   --  2.8*  --   AST  --   --  18  --   ALT  --   --  14  --   ALKPHOS  --   --  85  --   BILITOT  --   --  0.6  --   GFRNONAA 59*  --   --  56*  ANIONGAP 11  --   --  9    Lipids  Recent Labs  Lab 07/08/22 0054  CHOL 107  TRIG 56  HDL 23*  LDLCALC 73  CHOLHDL 4.7    Hematology Recent Labs  Lab 07/07/22 1335 07/07/22 1421 07/08/22 0054  WBC 9.4  --  12.1*  RBC 6.24*  --  5.44  HGB 19.5* 18.4* 16.9  HCT 58.6* 54.0* 50.4  MCV 93.9  --  92.6  MCH 31.3  --  31.1  MCHC 33.3  --  33.5  RDW 14.3  --  14.3  PLT 224  --  224   Thyroid No results for input(s): "TSH", "FREET4" in the last 168 hours.  BNPNo results for input(s): "BNP", "PROBNP" in the last 168 hours.  DDimer No results for input(s): "DDIMER" in the last 168 hours.   Radiology    DG Chest Port 1 View  Result Date: 07/07/2022 CLINICAL DATA:  Chest pain EXAM: PORTABLE CHEST 1 VIEW COMPARISON:  08/12/2012 FINDINGS: Cardiac shadow is prominent but accentuated by the portable technique. Mild vascular congestion and interstitial edema is seen. Small left pleural effusion is noted. No other focal abnormality is seen. IMPRESSION: Vascular congestion and edema. Electronically Signed   By: Alcide Clever M.D.   On: 07/07/2022 19:54   CARDIAC CATHETERIZATION  Result Date: 07/07/2022   Prox LAD to Mid LAD lesion is 45% stenosed.   Ost Cx to Prox Cx lesion is 40% stenosed.   1st Mrg lesion is 40% stenosed.   Prox RCA to Mid RCA lesion is 50% stenosed.   The left ventricular systolic function is normal.   LV end diastolic pressure is normal.   The left ventricular ejection fraction is 55-65% by visual estimate.   There is trivial (1+) aortic regurgitation. Nonobstructive CAD. No culprit lesion seen to explain his severe chest pain.  There is diffuse in stent disease in the RCA but this appears to be 50%. Normal LV function Normal LVEDP Mildly dilated aorta without dissection. Mild AI Plan: I suspect this represents more demand ischemia in setting of rapid Afib/flutter and severely elevated BP. Recommend medical management with optimal BP control. Will resume IV heparin for now post cath. Anticipate transition to oral DOAC.    Cardiac Studies   See above  Patient Profile     71 y.o. male with a history of CAD with inferior STEMI in 02/2002 s/p DES with in-stent restenosis in 2005 s/p repeat PCI with DES, PAD s/p stenting of left external iliac artery in 07/2012, hypertension, hyperlipidemia, and remote tobacco use who presented to the ED on 07/07/2022 for further evaluation of chest pain and EKG was concerning for possible STEMI in anterior leads. Cardiac cath with stable mild to moderate non-obstructive disease. He was found to have atrial flutter on admission and was hypertensive.   Assessment & Plan    CAD with chest pain: Cardiac cath 07/07/22 with stable CAD. Will plan to continue ASA, statin and beta blocker. Echo pending today Atrial flutter: New this admission. He will need long term anti-coagulation. Will start Eliquis 5 mg po BID today. Continue beta blocker.  HTN: BP better controlled this am. Continue Norvasc and Toprol Pulmonary edema: crackles on exam in left lung base. Chest x-ray with evidence of left pleural effusion. O2 sats in the low 90s on supplemental O2. I will give one dose of IV Lasix. He is demanding to go home later today. If he decides to leave today, will need to go home with Lasix 40 mg po daily with close office follow up.  Possible discharge later today pending echo results.    For questions or updates, please contact [facility] Please consult [email or web address] for contact info under        Signed, Verne Carrow, MD  07/08/2022,  9:34 AM

## 2022-07-08 NOTE — Progress Notes (Signed)
CARDIAC REHAB PHASE I   PRE:  Rate/Rhythm: SB, HR 53    BP: sitting 131/74    SaO2: 93%, RA  MODE:  Ambulation: 175 ft   POST:  Rate/Rhythm: SR, HR 61    BP: sitting 133/70     SaO2: 88% while ambulating  Pt sitting in bed agreed to ambulate with RN. Pt denies CP, SOB. After ambulation and oxygen checked at 84%. Pt encouraged to do pursed lip breathing and elevated bed and oxygen recovered to 89%. Spoke with primary RN who will administer lasix and recheck oxygen while ambulating. Pt encouraged to find a PCP upon discharge and to keep and attend his cardiology appointments. Pt and his wife were educated about heart attack, post wound education and restrictions, diet, exercise, nitro, outpatient cardiac rehab, and medication compliance. Pt agreeable and referral will be sent to [facility].     Service time is from 0958 to [address], RN, BSN 07/08/2022 10:35 AM

## 2022-07-08 NOTE — Care Management CC44 (Signed)
Condition Code 44 Documentation Completed  Patient Details  Name: Jeffrey Norton MRN: [id number] Date of Birth: 1950/11/04   Condition Code 44 given:  Yes Patient signature on Condition Code 44 notice:  Yes Documentation of 2 MD's agreement:  Yes Code 44 added to claim:  Yes    Leone Haven, RN 07/08/2022, 2:24 PM

## 2022-07-08 NOTE — Care Management Obs Status (Signed)
MEDICARE OBSERVATION STATUS NOTIFICATION   Patient Details  Name: Jeffrey Norton MRN: [id number] Date of Birth: 1950/08/05   Medicare Observation Status Notification Given:  Yes    Leone Haven, RN 07/08/2022, 2:23 PM

## 2022-07-08 NOTE — Discharge Summary (Addendum)
Discharge Summary    Patient ID: Jeffrey Norton MRN: [id number]; DOB: [birth year]  Admit date: 07/07/2022 Discharge date: 07/08/2022  PCP:  Stephens Shire, MD   [place] Providers Cardiologist:  Quay Burow, MD     Discharge Diagnoses    Principal Problem:   Atrial flutter [facility]) Active Problems:   HTN (hypertension)   Hyperlipidemia   Atrial fibrillation ([place])   Non-ST elevation (NSTEMI) myocardial infarction [facility])   NSTEMI (non-ST elevated myocardial infarction) ([place])   HFrEF (heart failure with reduced ejection fraction) [facility])   Diagnostic Studies/Procedures    Cath: 07/07/2022    Prox LAD to Mid LAD lesion is 45% stenosed.   Ost Cx to Prox Cx lesion is 40% stenosed.   1st Mrg lesion is 40% stenosed.   Prox RCA to Mid RCA lesion is 50% stenosed.   The left ventricular systolic function is normal.   LV end diastolic pressure is normal.   The left ventricular ejection fraction is 55-65% by visual estimate.   There is trivial (1+) aortic regurgitation.   Nonobstructive CAD. No culprit lesion seen to explain his severe chest pain. There is diffuse in stent disease in the RCA but this appears to be 50%.  Normal LV function Normal LVEDP Mildly dilated aorta without dissection.  Mild AI   Plan: I suspect this represents more demand ischemia in setting of rapid Afib/flutter and severely elevated BP. Recommend medical management with optimal BP control. Will resume IV heparin for now post cath. Anticipate transition to oral DOAC.   Diagnostic Dominance: Right  _____________   History of Present Illness     Jeffrey Norton is a 71 y.o. male with a history of CAD with inferior STEMI in 02/2002 s/p DES with in-stent restenosis in 2005 s/p repeat PCI with DES, PAD s/p stenting of left external iliac artery in 07/2012, hypertension, hyperlipidemia, and remote tobacco use who presented to the ED on 07/07/2022 for further evaluation of chest pain and EKG  was concerning for possible STEMI in anterior leads.   He had not been seen by Cardiology since 2014 and had not seen his PCP in "a while." He was in his usual state of health until the day prior to admission around lunch when he developed substernal chest pain. Pain persisted but was much more severe the following morning when he woke up. He ranked it as 10/10 on the pain scale and described it as an elephant sitting on his chest. No radiation to back, neck, or arm. He had some associated shortness of breath with this but no associated nausea or diaphoresis. No other chest pain prior to yesterday. No orthopnea, PND, edema, palpitations, significant dizziness, or syncope. No recent fevers or illnesses. He stated he had a chronic cough since having COVID 2 years ago. No abnormal bleeding in urine or stools.   Upon arrival to the ED, he was hypertensive and tachycardic. EKG showed atrial flutter with 2:1 AV block and rate 136 bpm with ST elevation in anterior leads (possible LVH but could not rule out true STEMI). He was still having severe 10/10 chest pain. Therefore, decision was made to take him directly to the cath lab for emergent cardiac catheterization. He took 2 baby Aspirin at home and then was given another 2 in the ED. He also received 5,000 unit of Heparin in the ED.   Of note, has a remote smoking history but quit 20 years ago around the time of his first  MI. He does not take any routine medications home.    Hospital Course     Chest pain CAD s/p DES with in-stent restenosis in 2005 s/p repeat PCI with DES  -- underwent cardiac cath noted above with stable CAD. It was felt pain was likely demand from new onset atrial flutter with underlying CAD -- follow up echo showed LVEF of 45-50%, global hypokinesis, G2DD -- continue ASA, statin, BB  Newly diagnosed Atrial flutter -- initially rates in the 130s, placed on metoprolol 80m BID with improved HR and conversation to sinus bradycardia --  placed on Eliquis 591mBID  HTN -- blood pressures initially elevated on admission -- improved with metoprolol 5029mID, along with amlodipine 5mg2mily  HLD -- LDL 73, HDL 23 -- on high dose statin  HFmrEF Pulmonary Edema -- Echo showed LVEF of 45-50%, did have crackles on exam. CXR with left pleural effusion the morning of 12/13. He was given IV lasix. It was recommended that he remain inpatient for additional treatment but he refused/demanded to leave.  -- started lasix 40mg71mdaily at discharge -- continue metoprolol 50mg 28m As noted above, it was advised that he remain inpatient for addition treatment given his pulmonary edema. Despite these recommendations the patient adamantly demanded to be discharged. [facility] Educated by pharmD prior to discharge. Close follow up arranged.   Did the patient have an acute coronary syndrome (MI, NSTEMI, STEMI, etc) this admission?:  No                               Did the patient have a percutaneous coronary intervention (stent / angioplasty)?:  No.    The patient will be scheduled for a TOC follow up appointment in 10-14 days.  A [facility] at the office where the patient should be seen for follow up.  _____________  Discharge Vitals Blood pressure 134/60, pulse (!) 55, temperature (!) 97.4 F (36.3 C), temperature source Oral, resp. rate 17, height 5' 9.02" (1.753 m), weight 75.2 kg, SpO2 93 %.  Filed Weights   07/07/22 1900 07/08/22 0120 07/08/22 0500  Weight: 75.3 kg 75.3 kg 75.2 kg    Labs & Radiologic Studies    CBC Recent Labs    07/07/22 1335 07/07/22 1421 07/08/22 0054  WBC 9.4  --  12.1*  HGB 19.5* 18.4* 16.9  HCT 58.6* 54.0* 50.4  MCV 93.9  --  92.6  PLT 224  --  224   562ic Metabolic Panel Recent Labs    07/07/22 1335 07/07/22 1421 07/08/22 0054  NA 139 142 136  K 4.4 3.9 5.0  CL 102 105 106  CO2 26  --  21*  GLUCOSE 130*  142* 118*  BUN _0 CREATININE 1.30* 1.00 1.35*  CALCIUM 9.0  --  8.4*   Liver Function Tests Recent Labs    07/07/22 1923  AST 18  ALT 14  ALKPHOS 85  BILITOT 0.6  PROT 7.4  ALBUMIN 2.8*   No results for input(s): "LIPASE", "AMYLASE" in the last 72 hours. High Sensitivity Troponin:   Recent Labs  Lab 07/07/22 1335 07/07/22 1923  TROPONINIHS 238* 204*    BNP Invalid input(s): "POCBNP" D-Dimer No results for input(s): "DDIMER" in the last 72 hours. Hemoglobin A1C No results for input(s): "HGBA1C" in the last 72  hours. Fasting Lipid Panel Recent Labs    07/08/22 0054  CHOL 107  HDL 23*  LDLCALC 73  TRIG 56  CHOLHDL 4.7   Thyroid Function Tests No results for input(s): "TSH", "T4TOTAL", "T3FREE", "THYROIDAB" in the last 72 hours.  Invalid input(s): "FREET3" _____________  ECHOCARDIOGRAM COMPLETE  Result Date: 07/08/2022    ECHOCARDIOGRAM REPORT   Patient Name:   ALDER MURRI Date of Exam: 07/08/2022 Medical Rec #:  [id number]        Height:       69.0 in Accession #:    [id number]       Weight:       165.7 lb Date of Birth:  1951-04-19        BSA:          1.907 m Patient Age:    37 years         BP:           112/67 mmHg Patient Gender: M                HR:           49 bpm. Exam Location:  Inpatient Procedure: 2D Echo, Cardiac Doppler and Color Doppler Indications:    chest pain  History:        Patient has no prior history of Echocardiogram examinations.                 Acute MI and CAD, Arrythmias:Atrial Fibrillation and Atrial                 Flutter; Risk Factors:Hypertension.  Sonographer:    Ronny Flurry Referring Phys: [id number] [place]  1. Left ventricular ejection fraction, by estimation, is 45 to 50%. The left ventricle has mildly decreased function. The left ventricle demonstrates global hypokinesis. The left ventricular internal cavity size was mildly dilated. Left ventricular diastolic parameters are consistent with Grade II  diastolic dysfunction (pseudonormalization).  2. Right ventricular systolic function is normal. The right ventricular size is normal. Tricuspid regurgitation signal is inadequate for assessing PA pressure.  3. Left atrial size was mildly dilated.  4. The mitral valve is grossly normal. Trivial mitral valve regurgitation. No evidence of mitral stenosis.  5. The aortic valve was not well visualized. Aortic valve regurgitation is not visualized. No aortic stenosis is present.  6. The inferior vena cava is dilated in size with >50% respiratory variability, suggesting right atrial pressure of 8 mmHg. FINDINGS  Left Ventricle: Left ventricular ejection fraction, by estimation, is 45 to 50%. The left ventricle has mildly decreased function. The left ventricle demonstrates global hypokinesis. The left ventricular internal cavity size was mildly dilated. There is  no left ventricular hypertrophy. Left ventricular diastolic parameters are consistent with Grade II diastolic dysfunction (pseudonormalization). Right Ventricle: The right ventricular size is normal. No increase in right ventricular wall thickness. Right ventricular systolic function is normal. Tricuspid regurgitation signal is inadequate for assessing PA pressure. Left Atrium: Left atrial size was mildly dilated. Right Atrium: Right atrial size was normal in size. Pericardium: There is no evidence of pericardial effusion. Mitral Valve: The mitral valve is grossly normal. Trivial mitral valve regurgitation. No evidence of mitral valve stenosis. Tricuspid Valve: The tricuspid valve is grossly normal. Tricuspid valve regurgitation is trivial. No evidence of tricuspid stenosis. Aortic Valve: The aortic valve was not well visualized. Aortic valve regurgitation is not visualized. No aortic stenosis is present. Aortic valve mean gradient measures 4.0 mmHg.  Aortic valve peak gradient measures 8.0 mmHg. Aortic valve area, by VTI measures 2.49 cm. Pulmonic Valve: The  pulmonic valve was grossly normal. Pulmonic valve regurgitation is not visualized. No evidence of pulmonic stenosis. Aorta: The aortic root and ascending aorta are structurally normal, with no evidence of dilitation. Venous: The inferior vena cava is dilated in size with greater than 50% respiratory variability, suggesting right atrial pressure of 8 mmHg. IAS/Shunts: The atrial septum is grossly normal.  LEFT VENTRICLE PLAX 2D LVIDd:         6.00 cm   Diastology LVIDs:         4.90 cm   LV e' medial:    4.99 cm/s LV PW:         1.30 cm   LV E/e' medial:  18.2 LV IVS:        1.00 cm   LV e' lateral:   6.05 cm/s LVOT diam:     2.20 cm   LV E/e' lateral: 15.0 LV SV:         68 LV SV Index:   35 LVOT Area:     3.80 cm  RIGHT VENTRICLE             IVC RV Basal diam:  2.80 cm     IVC diam: 2.50 cm RV S prime:     10.10 cm/s TAPSE (M-mode): 1.6 cm LEFT ATRIUM             Index        RIGHT ATRIUM           Index LA diam:        4.40 cm 2.31 cm/m   RA Area:     16.60 cm LA Vol (A2C):   53.5 ml 28.05 ml/m  RA Volume:   36.30 ml  19.03 ml/m LA Vol (A4C):   73.0 ml 38.28 ml/m LA Biplane Vol: 65.5 ml 34.34 ml/m  AORTIC VALVE AV Area (Vmax):    2.44 cm AV Area (Vmean):   2.52 cm AV Area (VTI):     2.49 cm AV Vmax:           141.00 cm/s AV Vmean:          83.400 cm/s AV VTI:            0.272 m AV Peak Grad:      8.0 mmHg AV Mean Grad:      4.0 mmHg LVOT Vmax:         90.55 cm/s LVOT Vmean:        55.250 cm/s LVOT VTI:          0.178 m LVOT/AV VTI ratio: 0.65  AORTA Ao Root diam: 3.80 cm Ao Asc diam:  3.20 cm MITRAL VALVE MV Area (PHT): 3.54 cm    SHUNTS MV Decel Time: 214 msec    Systemic VTI:  0.18 m MV E velocity: 91.05 cm/s  Systemic Diam: 2.20 cm MV A velocity: 63.35 cm/s MV E/A ratio:  1.44 Eleonore Chiquito MD Electronically signed by Eleonore Chiquito MD Signature Date/Time: 07/08/2022/1:16:52 PM    Final    DG Chest Port 1 View  Result Date: 07/07/2022 CLINICAL DATA:  Chest pain EXAM: PORTABLE CHEST 1 VIEW  COMPARISON:  08/12/2012 FINDINGS: Cardiac shadow is prominent but accentuated by the portable technique. Mild vascular congestion and interstitial edema is seen. Small left pleural effusion is noted. No other focal abnormality is seen. IMPRESSION: Vascular congestion and edema. Electronically Signed  By: Inez Catalina M.D.   On: 07/07/2022 19:54   CARDIAC CATHETERIZATION  Result Date: 07/07/2022   Prox LAD to Mid LAD lesion is 45% stenosed.   Ost Cx to Prox Cx lesion is 40% stenosed.   1st Mrg lesion is 40% stenosed.   Prox RCA to Mid RCA lesion is 50% stenosed.   The left ventricular systolic function is normal.   LV end diastolic pressure is normal.   The left ventricular ejection fraction is 55-65% by visual estimate.   There is trivial (1+) aortic regurgitation. Nonobstructive CAD. No culprit lesion seen to explain his severe chest pain. There is diffuse in stent disease in the RCA but this appears to be 50%. Normal LV function Normal LVEDP Mildly dilated aorta without dissection. Mild AI Plan: I suspect this represents more demand ischemia in setting of rapid Afib/flutter and severely elevated BP. Recommend medical management with optimal BP control. Will resume IV heparin for now post cath. Anticipate transition to oral DOAC.   Disposition   Pt is being discharged home today in good condition.  Follow-up Plans & Appointments     Follow-up Information     Lenna Sciara, NP Follow up on 07/17/2022.   Specialties: Cardiology, Family Medicine Why: at 1:55pm for your follow up appt with Dr. Rachel Bo' NP Maris Berger information: [address] [place] [place] [address] [phone]                Discharge Instructions     AMB referral to cardiac rehabilitation   Complete by: As directed    Diagnosis: Type II MI   After initial evaluation and assessments completed: Virtual Based Care may be provided alone or in conjunction with Phase 2 Cardiac Rehab based on patient  barriers.: Yes   Intensive Cardiac Rehabilitation ([place]) [place] location only OR Traditional Cardiac Rehabilitation (TCR) *If criteria for ICR are not met will enroll in TCR [facility] only): Yes   Call MD for:  difficulty breathing, headache or visual disturbances   Complete by: As directed    Call MD for:  persistant dizziness or light-headedness   Complete by: As directed    Call MD for:  redness, tenderness, or signs of infection (pain, swelling, redness, odor or green/yellow discharge around incision site)   Complete by: As directed    Diet - low sodium heart healthy   Complete by: As directed    Discharge instructions   Complete by: As directed    Radial Site Care Refer to this sheet in the next few weeks. These instructions provide you with information on caring for yourself after your procedure. Your caregiver may also give you more specific instructions. Your treatment has been planned according to current medical practices, but problems sometimes occur. Call your caregiver if you have any problems or questions after your procedure. HOME CARE INSTRUCTIONS You may shower the day after the procedure. Remove the bandage (dressing) and gently wash the site with plain soap and water. Gently pat the site dry.  Do not apply powder or lotion to the site.  Do not submerge the affected site in water for 3 to 5 days.  Inspect the site at least twice daily.  Do not flex or bend the affected arm for 24 hours.  No lifting over 5 pounds (2.3 kg) for 5 days after your procedure.  Do not drive home if you are discharged the same day of the procedure. Have someone else drive you.  You may  drive 24 hours after the procedure unless otherwise instructed by your caregiver.  What to expect: Any bruising will usually fade within 1 to 2 weeks.  Blood that collects in the tissue (hematoma) may be painful to the touch. It should usually decrease in size and tenderness within 1 to 2 weeks.  SEEK IMMEDIATE MEDICAL CARE  IF: You have unusual pain at the radial site.  You have redness, warmth, swelling, or pain at the radial site.  You have drainage (other than a small amount of blood on the dressing).  You have chills.  You have a fever or persistent symptoms for more than 72 hours.  You have a fever and your symptoms suddenly get worse.  Your arm becomes pale, cool, tingly, or numb.  You have heavy bleeding from the site. Hold pressure on the site.   Increase activity slowly   Complete by: As directed         Discharge Medications   Allergies as of 07/08/2022   No Known Allergies      Medication List     STOP taking these medications    clopidogrel 75 MG tablet Commonly known as: PLAVIX       TAKE these medications    amLODipine 5 MG tablet Commonly known as: NORVASC Take 1 tablet (5 mg total) by mouth daily. Start taking on: July 09, 2022   apixaban 5 MG Tabs tablet Commonly known as: ELIQUIS Take 1 tablet (5 mg total) by mouth 2 (two) times daily.   APPLE CIDER VINEGAR PO Take 1 capsule by mouth daily.   ASCORBIC ACID PO Take 1 tablet by mouth daily. Vitamin C, unknown strength.   aspirin 81 MG tablet Take 1 tablet (81 mg total) by mouth daily. What changed:  how much to take when to take this reasons to take this   CO Q 10 PO Take 1 capsule by mouth daily.   furosemide 40 MG tablet Commonly known as: Lasix Take 1 tablet (40 mg total) by mouth daily.   metoprolol tartrate 50 MG tablet Commonly known as: LOPRESSOR Take 1 tablet (50 mg total) by mouth 2 (two) times daily.   nitroGLYCERIN 0.4 MG SL tablet Commonly known as: NITROSTAT Place 1 tablet (0.4 mg total) under the tongue every 5 (five) minutes as needed for chest pain.   OVER THE COUNTER MEDICATION Take 1 capsule by mouth daily. Black cumin seed oil, supplement.   rosuvastatin 40 MG tablet Commonly known as: CRESTOR Take 1 tablet (40 mg total) by mouth daily. Start taking on: July 09, 2022   VITAMIN D-3 PO Take 1 capsule by mouth daily.   VITAMIN K PO Take 1 tablet by mouth daily.         Outstanding Labs/Studies   N/a   Duration of Discharge Encounter   Greater than 30 minutes including physician time.  Signed, Reino Bellis, NP 07/08/2022, 3:11 PM   I have personally seen and examined this patient. I agree with the assessment and plan as outlined above.  See my full note today. We have asked him to stay today but he prefers to go home. Will plan d/c today.   Lauree Chandler, MD, [facility] 07/08/2022 3:21 PM

## 2022-07-08 NOTE — Progress Notes (Signed)
Pt being d/c, VSS, IV removed, Education complete.   Balinda Quails, RN 07/08/2022 3:46 PM

## 2022-07-08 NOTE — Progress Notes (Incomplete)
Echocardiogram 2D Echocardiogram has been performed.  Lucendia Herrlich 07/08/2022, 12:08 PM

## 2022-07-08 NOTE — Care Management Obs Status (Deleted)
MEDICARE OBSERVATION STATUS NOTIFICATION   Patient Details  Name: Jeffrey Norton MRN: [id number] Date of Birth: 09/09/1950   Medicare Observation Status Notification Given:  Yes    ,  [place], RN 07/08/2022, 2:23 PM 

## 2022-07-08 NOTE — TOC Transition Note (Signed)
Transition of Care [facility]) - CM/SW Discharge Note   Patient Details  Name: Jeffrey Norton MRN: [id number] Date of Birth: [birth year]  [facility] CM/SW Contact:  Leone Haven, RN Phone Number: 07/08/2022, 3:32 PM   Clinical Narrative:     Patient is for dc home today, has no needs. TOC to fill meds.         Patient Goals and CMS Choice        Discharge Placement                       Discharge Plan and Services                                     Social Determinants of Health (SDOH) Interventions     Readmission Risk Interventions     No data to display

## 2022-07-08 NOTE — Discharge Instructions (Signed)

## 2022-07-09 LAB — HEMOGLOBIN A1C
Hgb A1c MFr Bld: 5.7 % — ABNORMAL HIGH (ref 4.8–5.6)
Mean Plasma Glucose: 117 mg/dL

## 2022-07-09 LAB — LIPOPROTEIN A (LPA): Lipoprotein (a): 27.8 nmol/L (ref <75.0)

## 2022-07-16 ENCOUNTER — Telehealth (HOSPITAL_COMMUNITY): Payer: Self-pay

## 2022-07-16 NOTE — Telephone Encounter (Signed)
Attempted to call patient in regards to Cardiac Rehab - LM on VM 

## 2022-07-17 ENCOUNTER — Ambulatory Visit: Payer: BC Managed Care – PPO | Attending: Nurse Practitioner | Admitting: Nurse Practitioner

## 2022-07-17 ENCOUNTER — Encounter: Payer: Self-pay | Admitting: Nurse Practitioner

## 2022-07-17 VITALS — BP 160/82 | HR 53 | Ht 69.0 in | Wt 164.8 lb

## 2022-07-17 DIAGNOSIS — E785 Hyperlipidemia, unspecified: Secondary | ICD-10-CM

## 2022-07-17 DIAGNOSIS — I739 Peripheral vascular disease, unspecified: Secondary | ICD-10-CM

## 2022-07-17 DIAGNOSIS — I4892 Unspecified atrial flutter: Secondary | ICD-10-CM | POA: Diagnosis not present

## 2022-07-17 DIAGNOSIS — I5022 Chronic systolic (congestive) heart failure: Secondary | ICD-10-CM

## 2022-07-17 DIAGNOSIS — I10 Essential (primary) hypertension: Secondary | ICD-10-CM | POA: Diagnosis not present

## 2022-07-17 DIAGNOSIS — I25118 Atherosclerotic heart disease of native coronary artery with other forms of angina pectoris: Secondary | ICD-10-CM | POA: Diagnosis not present

## 2022-07-17 NOTE — Progress Notes (Signed)
Pt declines titration of antihypertensive regimen.  

## 2022-07-17 NOTE — Patient Instructions (Signed)
Medication Instructions:  Your physician recommends that you continue on your current medications as directed. Please refer to the Current Medication list given to you today.  *If you need a refill on your cardiac medications before your next appointment, please call your pharmacy*   Lab Work: Your physician recommends that you complete lab work today. BMET   If you have labs (blood work) drawn today and your tests are completely normal, you will receive your results only by: MyChart Message (if you have MyChart) OR A paper copy in the mail If you have any lab test that is abnormal or we need to change your treatment, we will call you to review the results.   Testing/Procedures: NONE ordered at this time of appointment     Follow-Up: At [facility], you and your health needs are our priority.  As part of our continuing mission to provide you with exceptional heart care, we have created designated Provider Care Teams.  These Care Teams include your primary Cardiologist (physician) and Advanced Practice Providers (APPs -  Physician Assistants and Nurse Practitioners) who all work together to provide you with the care you need, when you need it.  We recommend signing up for the patient portal called "MyChart".  Sign up information is provided on this After Visit Summary.  MyChart is used to connect with patients for Virtual Visits (Telemedicine).  Patients are able to view lab/test results, encounter notes, upcoming appointments, etc.  Non-urgent messages can be sent to your provider as well.   To learn more about what you can do with MyChart, go to [email or web address].    Your next appointment:   6 month(s)  The format for your next appointment:   In Person  Provider:   Nanetta Batty, MD     Other Instructions [employer] recommended and [employer] Blood Pressure cuff.   Important Information About Sugar

## 2022-07-17 NOTE — Progress Notes (Signed)
Office Visit    Patient Name: Jeffrey Norton Date of Encounter: 07/17/2022  Primary Care Provider:  [name], MD Primary Cardiologist:  Nanetta Batty, MD  Chief Complaint    71 year old male with a history of CAD s/p inferior STEMI, DES-RCA in 2003, DES-RCA in 2005 (ISR), PAD s/p stenting of left external iliac artery in 2014, atrial flutter, hypertension, hyperlipidemia, and remote tobacco use who presents for hospital follow-up related to CAD and atrial flutter.  Past Medical History    Past Medical History:  Diagnosis Date   Coronary artery disease    Hyperlipidemia    Hypertension    Myocardial infarction [facility])    PVD (peripheral vascular disease) (HCC)    S/P angioplasty with stent to Lt. iliac 08/15/12 08/15/2012   Past Surgical History:  Procedure Laterality Date   2D Echocardiogram  08/25/2004   EF >55%, LA-moderately dilated.   CARDIAC CATHETERIZATION  03/11/2002   RCA subtotally occluded with TIMI-1 to1/2 flow stented with a 3x23 Cypher DES resulting in reduction of occlusion to 0% with TIMI-3 flow   LEA Doppler  08/29/2012   L SFA Proximal: 70-99% diameter reduction. Velocities suggest upper end of range-new finding when compared to previous study. L EIA stent: opne and patent without evidence of restenosis   LEFT HEART CATH AND CORONARY ANGIOGRAPHY N/A 07/07/2022   Procedure: LEFT HEART CATH AND CORONARY ANGIOGRAPHY;  Surgeon: [name], Peter M, MD;  Location: [facility] INVASIVE CV LAB;  Service: Cardiovascular;  Laterality: N/A;   Lexiscan Myoview  07/19/2012   No significant ECG changes, mild inferobasal hypokinesis with normal LV function, low risk stress nuclear study   PV Intervention  08/15/2012   Occluded L External Iliac-stented with a 10x75mm Abbott Nitinol Absolute Pro self-expanding stent. 75% stenosis in L Common Femoral artery stented witha 8x27mm lon Absolute Nitinol Absolute Po self-expanding stent.    Allergies  No Known Allergies  History of  Present Illness    71 year old male with the above past medical history including CAD s/p inferior STEMI, DES-RCA in 2003, DES-RCA in 2005 (ISR), PAD s/p stenting of left external iliac artery in 2014, atrial flutter, hypertension, hyperlipidemia, and remote tobacco use.  History of CAD as above.  He previously followed with our practice but had not been seen since 2014.  He presented to the ED on 07/07/2022 with severe chest pain.  EKG showed atrial flutter with 2-1 A-V conduction, ST elevation in V2-V3.  ST elevation was felt to be possibly due to LVH but given persistent chest pain, elevated troponin (238), he was taken to the Cath Lab for emergent cardiac catheterization which revealed proximal to mid LAD 45%, ostial circumflex to proximal circumflex 40%, OM1 40%, proximal and mid RCA 50% (ISR), normal LV systolic function, normal LVEDP, EF 55 to 65%, trivial aortic valve regurgitation.  It was thought that his pain and elevated troponin was likely due to demand ischemia from new onset atrial flutter with underlying CAD. Echocardiogram revealed EF 45 to 50%, global hypokinesis, G2 DD.  He was diuresed with IV Lasix in the setting of pulmonary edema (chest x-ray with left pleural effusion, crackles on exam).  Additionally, he was started on metoprolol and Eliquis. He converted to NSR prior to discharge.  He was discharged home in stable condition on 07/08/2022.  He presents today for follow-up accompanied by his wife.  Since his hospitalization  he has been stable from a cardiac standpoint.  He denies any chest pain, palpitations, dyspnea, edema,  PND, orthopnea, weight gain.  Overall, he reports feeling well.  Home Medications    Current Outpatient Medications  Medication Sig Dispense Refill   amLODipine (NORVASC) 5 MG tablet Take 1 tablet (5 mg total) by mouth daily. 90 tablet 0   apixaban (ELIQUIS) 5 MG TABS tablet Take 1 tablet (5 mg total) by mouth 2 (two) times daily. 180 tablet 1   APPLE CIDER  VINEGAR PO Take 1 capsule by mouth daily.     ASCORBIC ACID PO Take 1 tablet by mouth daily. Vitamin C, unknown strength.     aspirin 81 MG tablet Take 1 tablet (81 mg total) by mouth daily. 30 tablet    Cholecalciferol (VITAMIN D-3 PO) Take 1 capsule by mouth daily.     Coenzyme Q10 (CO Q 10 PO) Take 1 capsule by mouth daily.     furosemide (LASIX) 40 MG tablet Take 1 tablet (40 mg total) by mouth daily. 30 tablet 1   metoprolol tartrate (LOPRESSOR) 50 MG tablet Take 1 tablet (50 mg total) by mouth 2 (two) times daily. 180 tablet 1   nitroGLYCERIN (NITROSTAT) 0.4 MG SL tablet Place 1 tablet (0.4 mg total) under the tongue every 5 (five) minutes as needed for chest pain. 25 tablet 1   OVER THE COUNTER MEDICATION Take 1 capsule by mouth daily. Black cumin seed oil, supplement.     rosuvastatin (CRESTOR) 40 MG tablet Take 1 tablet (40 mg total) by mouth daily. 90 tablet 1   VITAMIN K PO Take 1 tablet by mouth daily.     No current facility-administered medications for this visit.     Review of Systems    He denies chest pain, palpitations, dyspnea, pnd, orthopnea, n, v, dizziness, syncope, edema, weight gain, or early satiety. All other systems reviewed and are otherwise negative except as noted above.    Cardiac Rehabilitation Eligibility Assessment  The patient is ready to start cardiac rehabilitation from a cardiac standpoint.    Physical Exam    VS:  BP (!) 160/82   Pulse (!) 53   Ht 5\' 9"  (1.753 m)   Wt 164 lb 12.8 oz (74.8 kg)   SpO2 94%   BMI 24.34 kg/m   GEN: Well nourished, well developed, in no acute distress. HEENT: normal. Neck: Supple, no JVD, carotid bruits, or masses. Cardiac: RRR, no murmurs, rubs, or gallops. No clubbing, cyanosis, edema.  Radials/DP/PT 2+ and equal bilaterally.  Respiratory:  Respirations regular and unlabored, clear to auscultation bilaterally. GI: Soft, nontender, nondistended, BS + x 4. MS: no deformity or atrophy. Skin: warm and dry, no  rash. Neuro:  Strength and sensation are intact. Psych: Normal affect.  Accessory Clinical Findings    ECG personally reviewed by me today -sinus bradycardia, 53 bpm- no acute changes.   Lab Results  Component Value Date   WBC 12.1 (H) 07/08/2022   HGB 16.9 07/08/2022   HCT 50.4 07/08/2022   MCV 92.6 07/08/2022   PLT 224 07/08/2022   Lab Results  Component Value Date   CREATININE 1.35 (H) 07/08/2022   BUN 17 07/08/2022   NA 136 07/08/2022   K 5.0 07/08/2022   CL 106 07/08/2022   CO2 21 (L) 07/08/2022   Lab Results  Component Value Date   ALT 14 07/07/2022   AST 18 07/07/2022   ALKPHOS 85 07/07/2022   BILITOT 0.6 07/07/2022   Lab Results  Component Value Date   CHOL 107 07/08/2022   HDL 23 (L) 07/08/2022  LDLCALC 73 07/08/2022   TRIG 56 07/08/2022   CHOLHDL 4.7 07/08/2022    Lab Results  Component Value Date   HGBA1C 5.7 (H) 07/07/2022    Assessment & Plan    1. CAD: Recent hospitalization in the setting of suspected STEMI (elevated troponin ultimately thought to be due to demand ischemia).  Cath revealed proximal to mid LAD 45%, ostial circumflex to proximal circumflex 40%, OM1 40%, proximal and mid RCA 50% (ISR), normal LV systolic function, normal LVEDP, EF 55 to 65%, trivial aortic valve regurgitation. Stable with no anginal symptoms.  Continue aspirin, amlodipine, metoprolol.  2. HFmrEF:  Recent echo revealed EF 45 to 50%, global hypokinesis, G2 DD.  He was diuresed with IV Lasix in the setting of pulmonary edema (chest x-ray with left pleural effusion, crackles on exam).  Euvolemic and well compensated on exam. Will check BMET today.  Continue metoprolol, Lasix.  3. Paroxysmal atrial flutter: New onset. Maintaining NSR.  Discussed ED precautions, self-monitoring of [employer]ia[employer] device. Continue metoprolol and Eliquis.  4. Hypertension: BP elevated in office today. Declines titration of antihypertensive medication.   5. Hyperlipidemia: LDL was 73 in  06/2022. Declines lipid lowering therapy.   6. PAD: S/p stenting of left external iliac artery in 2014.  Declines repeat testing.   7. Disposition: Follow-up in 6 months (per pt request), sooner if needed.   HYPERTENSION CONTROL Vitals:   07/17/22 1339 07/17/22 1415  BP: (!) 162/82 (!) 160/82    The patient's blood pressure is elevated above target today.  In order to address the patient's elevated BP: Blood pressure will be monitored at home to determine if medication changes need to be made.    Lenna Sciara, NP 07/17/2022, 5:25 PM

## 2022-07-18 LAB — BASIC METABOLIC PANEL
BUN/Creatinine Ratio: 18 (ref 10–24)
BUN: 26 mg/dL (ref 8–27)
CO2: 29 mmol/L (ref 20–29)
Calcium: 9.3 mg/dL (ref 8.6–10.2)
Chloride: 98 mmol/L (ref 96–106)
Creatinine, Ser: 1.47 mg/dL — ABNORMAL HIGH (ref 0.76–1.27)
Glucose: 97 mg/dL (ref 70–99)
Potassium: 4.2 mmol/L (ref 3.5–5.2)
Sodium: 144 mmol/L (ref 134–144)
eGFR: 51 mL/min/{1.73_m2} — ABNORMAL LOW (ref >59)

## 2022-07-23 ENCOUNTER — Telehealth: Payer: Self-pay

## 2022-07-23 NOTE — Telephone Encounter (Signed)
Left a detailed message with lab results. Pt was notified of results and asked to follow up as planned.

## 2022-07-28 ENCOUNTER — Other Ambulatory Visit (HOSPITAL_COMMUNITY): Payer: Self-pay

## 2022-08-26 ENCOUNTER — Encounter (HOSPITAL_COMMUNITY): Payer: Self-pay

## 2022-09-09 ENCOUNTER — Other Ambulatory Visit: Payer: Self-pay | Admitting: Cardiology

## 2022-09-24 ENCOUNTER — Telehealth (HOSPITAL_COMMUNITY): Payer: Self-pay

## 2022-09-24 NOTE — Telephone Encounter (Signed)
No response from pt.  Closed referral  

## 2023-02-22 ENCOUNTER — Encounter (HOSPITAL_COMMUNITY)
Admission: EM | Discharge status: Against Medical Advice | Payer: Self-pay | Source: Home / Self Care | Attending: Cardiovascular Disease

## 2023-02-22 ENCOUNTER — Encounter (HOSPITAL_COMMUNITY): Payer: Self-pay | Admitting: Emergency Medicine

## 2023-02-22 ENCOUNTER — Other Ambulatory Visit: Payer: Self-pay

## 2023-02-22 ENCOUNTER — Inpatient Hospital Stay (HOSPITAL_COMMUNITY)
Admission: EM | Admit: 2023-02-22 | Discharge: 2023-02-23 | DRG: 251 | Discharge status: Against Medical Advice | Payer: Medicare Other | Attending: Cardiovascular Disease | Admitting: Cardiovascular Disease

## 2023-02-22 ENCOUNTER — Emergency Department (HOSPITAL_COMMUNITY): Payer: Self-pay

## 2023-02-22 ENCOUNTER — Emergency Department (HOSPITAL_COMMUNITY): Payer: Medicare Other

## 2023-02-22 DIAGNOSIS — I70202 Unspecified atherosclerosis of native arteries of extremities, left leg: Secondary | ICD-10-CM | POA: Diagnosis present

## 2023-02-22 DIAGNOSIS — K920 Hematemesis: Secondary | ICD-10-CM | POA: Diagnosis present

## 2023-02-22 DIAGNOSIS — Z7901 Long term (current) use of anticoagulants: Secondary | ICD-10-CM | POA: Diagnosis not present

## 2023-02-22 DIAGNOSIS — I214 Non-ST elevation (NSTEMI) myocardial infarction: Secondary | ICD-10-CM | POA: Diagnosis not present

## 2023-02-22 DIAGNOSIS — E875 Hyperkalemia: Secondary | ICD-10-CM | POA: Diagnosis present

## 2023-02-22 DIAGNOSIS — I252 Old myocardial infarction: Secondary | ICD-10-CM

## 2023-02-22 DIAGNOSIS — Z83438 Family history of other disorder of lipoprotein metabolism and other lipidemia: Secondary | ICD-10-CM

## 2023-02-22 DIAGNOSIS — Z833 Family history of diabetes mellitus: Secondary | ICD-10-CM

## 2023-02-22 DIAGNOSIS — R042 Hemoptysis: Secondary | ICD-10-CM | POA: Diagnosis not present

## 2023-02-22 DIAGNOSIS — I2111 ST elevation (STEMI) myocardial infarction involving right coronary artery: Secondary | ICD-10-CM

## 2023-02-22 DIAGNOSIS — E785 Hyperlipidemia, unspecified: Secondary | ICD-10-CM | POA: Diagnosis present

## 2023-02-22 DIAGNOSIS — I509 Heart failure, unspecified: Secondary | ICD-10-CM | POA: Diagnosis present

## 2023-02-22 DIAGNOSIS — K921 Melena: Secondary | ICD-10-CM | POA: Diagnosis not present

## 2023-02-22 DIAGNOSIS — I2119 ST elevation (STEMI) myocardial infarction involving other coronary artery of inferior wall: Principal | ICD-10-CM | POA: Diagnosis present

## 2023-02-22 DIAGNOSIS — I480 Paroxysmal atrial fibrillation: Secondary | ICD-10-CM | POA: Diagnosis present

## 2023-02-22 DIAGNOSIS — Z823 Family history of stroke: Secondary | ICD-10-CM | POA: Diagnosis not present

## 2023-02-22 DIAGNOSIS — I2510 Atherosclerotic heart disease of native coronary artery without angina pectoris: Secondary | ICD-10-CM | POA: Diagnosis present

## 2023-02-22 DIAGNOSIS — I200 Unstable angina: Secondary | ICD-10-CM | POA: Diagnosis present

## 2023-02-22 DIAGNOSIS — Z8249 Family history of ischemic heart disease and other diseases of the circulatory system: Secondary | ICD-10-CM

## 2023-02-22 DIAGNOSIS — Z7982 Long term (current) use of aspirin: Secondary | ICD-10-CM

## 2023-02-22 DIAGNOSIS — I110 Hypertensive heart disease with heart failure: Secondary | ICD-10-CM | POA: Diagnosis present

## 2023-02-22 DIAGNOSIS — Z841 Family history of disorders of kidney and ureter: Secondary | ICD-10-CM

## 2023-02-22 DIAGNOSIS — Z87891 Personal history of nicotine dependence: Secondary | ICD-10-CM

## 2023-02-22 DIAGNOSIS — Z9861 Coronary angioplasty status: Secondary | ICD-10-CM

## 2023-02-22 DIAGNOSIS — Z8616 Personal history of COVID-19: Secondary | ICD-10-CM

## 2023-02-22 DIAGNOSIS — Z79899 Other long term (current) drug therapy: Secondary | ICD-10-CM

## 2023-02-22 DIAGNOSIS — I249 Acute ischemic heart disease, unspecified: Secondary | ICD-10-CM | POA: Diagnosis present

## 2023-02-22 HISTORY — PX: CORONARY THROMBECTOMY: CATH118304

## 2023-02-22 HISTORY — PX: CORONARY BALLOON ANGIOPLASTY: CATH118233

## 2023-02-22 HISTORY — PX: LEFT HEART CATH AND CORONARY ANGIOGRAPHY: CATH118249

## 2023-02-22 LAB — TYPE AND SCREEN
ABO/RH(D): O POS
Antibody Screen: NEGATIVE

## 2023-02-22 LAB — ECHOCARDIOGRAM COMPLETE
AR max vel: 2.63 cm2
AV Peak grad: 7.1 mmHg
Ao pk vel: 1.34 m/s
Area-P 1/2: 4.31 cm2
Height: 69 in
MV M vel: 3.12 m/s
MV Peak grad: 38.9 mmHg
S' Lateral: 4.1 cm
Weight: 2624 oz

## 2023-02-22 LAB — MRSA NEXT GEN BY PCR, NASAL: MRSA by PCR Next Gen: NOT DETECTED

## 2023-02-22 LAB — BASIC METABOLIC PANEL
Anion gap: 13 (ref 5–15)
Anion gap: 12 (ref 5–15)
BUN: 19 mg/dL (ref 8–23)
BUN: 23 mg/dL (ref 8–23)
CO2: 25 mmol/L (ref 22–32)
CO2: 19 mmol/L — ABNORMAL LOW (ref 22–32)
Calcium: 9.1 mg/dL (ref 8.9–10.3)
Calcium: 8.4 mg/dL — ABNORMAL LOW (ref 8.9–10.3)
Chloride: 98 mmol/L (ref 98–111)
Chloride: 105 mmol/L (ref 98–111)
Creatinine, Ser: 1.84 mg/dL — ABNORMAL HIGH (ref 0.61–1.24)
Creatinine, Ser: 1.79 mg/dL — ABNORMAL HIGH (ref 0.61–1.24)
GFR, Estimated: 39 mL/min — ABNORMAL LOW (ref >60)
GFR, Estimated: 40 mL/min — ABNORMAL LOW (ref >60)
Glucose, Bld: 138 mg/dL — ABNORMAL HIGH (ref 70–99)
Glucose, Bld: 99 mg/dL (ref 70–99)
Potassium: 6 mmol/L — ABNORMAL HIGH (ref 3.5–5.1)
Potassium: 5.4 mmol/L — ABNORMAL HIGH (ref 3.5–5.1)
Sodium: 136 mmol/L (ref 135–145)
Sodium: 136 mmol/L (ref 135–145)

## 2023-02-22 LAB — COMPREHENSIVE METABOLIC PANEL
ALT: 95 U/L — ABNORMAL HIGH (ref 0–44)
AST: 205 U/L — ABNORMAL HIGH (ref 15–41)
Albumin: 2.7 g/dL — ABNORMAL LOW (ref 3.5–5.0)
Alkaline Phosphatase: 77 U/L (ref 38–126)
Anion gap: 11 (ref 5–15)
BUN: 29 mg/dL — ABNORMAL HIGH (ref 8–23)
CO2: 19 mmol/L — ABNORMAL LOW (ref 22–32)
Calcium: 7.9 mg/dL — ABNORMAL LOW (ref 8.9–10.3)
Chloride: 104 mmol/L (ref 98–111)
Creatinine, Ser: 2.07 mg/dL — ABNORMAL HIGH (ref 0.61–1.24)
GFR, Estimated: 34 mL/min — ABNORMAL LOW (ref >60)
Glucose, Bld: 139 mg/dL — ABNORMAL HIGH (ref 70–99)
Potassium: 6.1 mmol/L — ABNORMAL HIGH (ref 3.5–5.1)
Sodium: 134 mmol/L — ABNORMAL LOW (ref 135–145)
Total Bilirubin: 0.7 mg/dL (ref 0.3–1.2)
Total Protein: 6.7 g/dL (ref 6.5–8.1)

## 2023-02-22 LAB — CBC
HCT: 54.1 % — ABNORMAL HIGH (ref 39.0–52.0)
HCT: 50.5 % (ref 39.0–52.0)
Hemoglobin: 17.1 g/dL — ABNORMAL HIGH (ref 13.0–17.0)
Hemoglobin: 16.1 g/dL (ref 13.0–17.0)
MCH: 30.9 pg (ref 26.0–34.0)
MCH: 31.6 pg (ref 26.0–34.0)
MCHC: 31.6 g/dL (ref 30.0–36.0)
MCHC: 31.9 g/dL (ref 30.0–36.0)
MCV: 97.8 fL (ref 80.0–100.0)
MCV: 99 fL (ref 80.0–100.0)
Platelets: 197 10*3/uL (ref 150–400)
Platelets: 199 10*3/uL (ref 150–400)
RBC: 5.53 MIL/uL (ref 4.22–5.81)
RBC: 5.1 MIL/uL (ref 4.22–5.81)
RDW: 14.4 % (ref 11.5–15.5)
RDW: 14.6 % (ref 11.5–15.5)
WBC: 10.1 10*3/uL (ref 4.0–10.5)
WBC: 19 10*3/uL — ABNORMAL HIGH (ref 4.0–10.5)
nRBC: 0 % (ref 0.0–0.2)
nRBC: 0 % (ref 0.0–0.2)

## 2023-02-22 LAB — POCT ACTIVATED CLOTTING TIME
Activated Clotting Time: 996 seconds
Activated Clotting Time: 336 seconds

## 2023-02-22 LAB — TROPONIN I (HIGH SENSITIVITY)
Troponin I (High Sensitivity): 5714 ng/L (ref <18)
Troponin I (High Sensitivity): 8739 ng/L (ref <18)

## 2023-02-22 LAB — ABO/RH: ABO/RH(D): O POS

## 2023-02-22 LAB — BRAIN NATRIURETIC PEPTIDE: B Natriuretic Peptide: 560.5 pg/mL — ABNORMAL HIGH (ref 0.0–100.0)

## 2023-02-22 LAB — PROTIME-INR
INR: 1.1 (ref 0.8–1.2)
Prothrombin Time: 14.8 seconds (ref 11.4–15.2)

## 2023-02-22 LAB — GLUCOSE, RANDOM: Glucose, Bld: 96 mg/dL (ref 70–99)

## 2023-02-22 SURGERY — LEFT HEART CATH AND CORONARY ANGIOGRAPHY
Anesthesia: LOCAL

## 2023-02-22 MED ORDER — SODIUM CHLORIDE 0.9% FLUSH
3.0000 mL | Freq: Two times a day (BID) | INTRAVENOUS | Status: DC
  Administered 2023-02-22 – 2023-02-23 (×3): 3 mL via INTRAVENOUS

## 2023-02-22 MED ORDER — TICAGRELOR 90 MG PO TABS
ORAL_TABLET | ORAL | Status: AC
  Filled 2023-02-22: qty 2

## 2023-02-22 MED ORDER — SODIUM ZIRCONIUM CYCLOSILICATE 10 G PO PACK
10.0000 g | PACK | Freq: Once | ORAL | Status: AC
  Administered 2023-02-22: 10 g via ORAL
  Filled 2023-02-22: qty 1

## 2023-02-22 MED ORDER — DOPAMINE-DEXTROSE 3.2-5 MG/ML-% IV SOLN
INTRAVENOUS | Status: AC | PRN
  Administered 2023-02-22: 5 ug/kg/min via INTRAVENOUS

## 2023-02-22 MED ORDER — ACETAMINOPHEN 325 MG PO TABS
650.0000 mg | ORAL_TABLET | ORAL | Status: DC | PRN
  Filled 2023-02-22: qty 2

## 2023-02-22 MED ORDER — ACETAMINOPHEN 325 MG PO TABS
650.0000 mg | ORAL_TABLET | ORAL | Status: DC | PRN
  Administered 2023-02-22: 650 mg via ORAL

## 2023-02-22 MED ORDER — AMLODIPINE BESYLATE 5 MG PO TABS
5.0000 mg | ORAL_TABLET | Freq: Every day | ORAL | Status: DC
  Administered 2023-02-22 – 2023-02-23 (×2): 5 mg via ORAL
  Filled 2023-02-22 (×2): qty 1

## 2023-02-22 MED ORDER — IOHEXOL 350 MG/ML SOLN
INTRAVENOUS | Status: DC | PRN
  Administered 2023-02-22: 70 mL

## 2023-02-22 MED ORDER — SODIUM CHLORIDE 0.9 % WEIGHT BASED INFUSION: 3.0000 mL/kg/h | INTRAVENOUS | Status: DC

## 2023-02-22 MED ORDER — FUROSEMIDE 10 MG/ML IJ SOLN
40.0000 mg | Freq: Once | INTRAMUSCULAR | Status: AC
  Administered 2023-02-22: 40 mg via INTRAVENOUS
  Filled 2023-02-22: qty 4

## 2023-02-22 MED ORDER — PERFLUTREN LIPID MICROSPHERE
1.0000 mL | INTRAVENOUS | Status: AC | PRN
  Administered 2023-02-22: 2 mL via INTRAVENOUS

## 2023-02-22 MED ORDER — VERAPAMIL HCL 2.5 MG/ML IV SOLN
INTRAVENOUS | Status: DC | PRN
  Administered 2023-02-22: 10 mL via INTRA_ARTERIAL

## 2023-02-22 MED ORDER — ZOLPIDEM TARTRATE 5 MG PO TABS: 5.0000 mg | ORAL_TABLET | Freq: Every evening | ORAL | Status: DC | PRN

## 2023-02-22 MED ORDER — ASPIRIN 81 MG PO CHEW
162.0000 mg | CHEWABLE_TABLET | Freq: Once | ORAL | Status: AC
  Administered 2023-02-22: 162 mg via ORAL
  Filled 2023-02-22: qty 2

## 2023-02-22 MED ORDER — SODIUM CHLORIDE 0.9 % IV SOLN: 250.0000 mL | INTRAVENOUS | Status: DC | PRN

## 2023-02-22 MED ORDER — LABETALOL HCL 5 MG/ML IV SOLN: 10.0000 mg | INTRAVENOUS | Status: AC | PRN

## 2023-02-22 MED ORDER — ASPIRIN 81 MG PO CHEW
81.0000 mg | CHEWABLE_TABLET | Freq: Every day | ORAL | Status: DC
  Administered 2023-02-23: 81 mg via ORAL
  Filled 2023-02-22: qty 1

## 2023-02-22 MED ORDER — NITROGLYCERIN IN D5W 200-5 MCG/ML-% IV SOLN
0.0000 ug/min | INTRAVENOUS | Status: DC
  Administered 2023-02-22 (×2): 5 ug/min via INTRAVENOUS
  Filled 2023-02-22: qty 250

## 2023-02-22 MED ORDER — TIROFIBAN HCL IN NACL 5-0.9 MG/100ML-% IV SOLN
0.0750 ug/kg/min | INTRAVENOUS | Status: DC
  Administered 2023-02-22: 0.075 ug/kg/min via INTRAVENOUS
  Filled 2023-02-22: qty 100

## 2023-02-22 MED ORDER — SODIUM CHLORIDE 0.9% FLUSH: 3.0000 mL | INTRAVENOUS | Status: DC | PRN

## 2023-02-22 MED ORDER — TIROFIBAN HCL IN NACL 5-0.9 MG/100ML-% IV SOLN
INTRAVENOUS | Status: AC | PRN
  Administered 2023-02-22: .075 ug/kg/min via INTRAVENOUS

## 2023-02-22 MED ORDER — ONDANSETRON HCL 4 MG/2ML IJ SOLN: 4.0000 mg | Freq: Four times a day (QID) | INTRAMUSCULAR | Status: DC | PRN

## 2023-02-22 MED ORDER — VERAPAMIL HCL 2.5 MG/ML IV SOLN
INTRAVENOUS | Status: AC
  Filled 2023-02-22: qty 2

## 2023-02-22 MED ORDER — SODIUM CHLORIDE 0.9 % IV SOLN: INTRAVENOUS | Status: AC

## 2023-02-22 MED ORDER — MIDAZOLAM HCL 2 MG/2ML IJ SOLN
INTRAMUSCULAR | Status: DC | PRN
  Administered 2023-02-22: 2 mg via INTRAVENOUS

## 2023-02-22 MED ORDER — HEPARIN BOLUS VIA INFUSION
4000.0000 [IU] | Freq: Once | INTRAVENOUS | Status: AC
  Administered 2023-02-22: 4000 [IU] via INTRAVENOUS
  Filled 2023-02-22: qty 4000

## 2023-02-22 MED ORDER — IPRATROPIUM-ALBUTEROL 0.5-2.5 (3) MG/3ML IN SOLN
3.0000 mL | Freq: Once | RESPIRATORY_TRACT | Status: AC
  Administered 2023-02-22: 3 mL via RESPIRATORY_TRACT
  Filled 2023-02-22: qty 3

## 2023-02-22 MED ORDER — DEXTROSE 50 % IV SOLN
1.0000 | Freq: Once | INTRAVENOUS | Status: AC
  Administered 2023-02-22: 50 mL via INTRAVENOUS
  Filled 2023-02-22: qty 50

## 2023-02-22 MED ORDER — METOPROLOL TARTRATE 50 MG PO TABS
50.0000 mg | ORAL_TABLET | Freq: Two times a day (BID) | ORAL | Status: DC
  Administered 2023-02-22 – 2023-02-23 (×2): 50 mg via ORAL
  Filled 2023-02-22: qty 2
  Filled 2023-02-22: qty 1

## 2023-02-22 MED ORDER — SODIUM ZIRCONIUM CYCLOSILICATE 5 G PO PACK
5.0000 g | PACK | Freq: Once | ORAL | Status: AC
  Administered 2023-02-22: 5 g via ORAL
  Filled 2023-02-22: qty 1

## 2023-02-22 MED ORDER — ROSUVASTATIN CALCIUM 20 MG PO TABS
40.0000 mg | ORAL_TABLET | Freq: Every day | ORAL | Status: DC
  Administered 2023-02-22: 40 mg via ORAL
  Filled 2023-02-22 (×2): qty 2

## 2023-02-22 MED ORDER — DOPAMINE-DEXTROSE 3.2-5 MG/ML-% IV SOLN
2.0000 ug/kg/min | INTRAVENOUS | Status: DC
  Administered 2023-02-22: 3 ug/kg/min via INTRAVENOUS

## 2023-02-22 MED ORDER — HEPARIN SODIUM (PORCINE) 1000 UNIT/ML IJ SOLN
INTRAMUSCULAR | Status: DC | PRN
  Administered 2023-02-22: 5000 [IU] via INTRAVENOUS
  Administered 2023-02-22: 3500 [IU] via INTRAVENOUS

## 2023-02-22 MED ORDER — HEPARIN (PORCINE) 25000 UT/250ML-% IV SOLN
900.0000 [IU]/h | INTRAVENOUS | Status: DC
  Administered 2023-02-22: 900 [IU]/h via INTRAVENOUS
  Filled 2023-02-22: qty 250

## 2023-02-22 MED ORDER — TIROFIBAN HCL IN NACL 5-0.9 MG/100ML-% IV SOLN
INTRAVENOUS | Status: AC
  Filled 2023-02-22: qty 100

## 2023-02-22 MED ORDER — SODIUM CHLORIDE 0.9 % IV BOLUS
INTRAVENOUS | Status: AC | PRN
  Administered 2023-02-22: 250 mL via INTRAVENOUS

## 2023-02-22 MED ORDER — INSULIN ASPART 100 UNIT/ML IV SOLN
5.0000 [IU] | Freq: Once | INTRAVENOUS | Status: AC
  Administered 2023-02-22: 5 [IU] via INTRAVENOUS

## 2023-02-22 MED ORDER — LIDOCAINE HCL (PF) 1 % IJ SOLN
INTRAMUSCULAR | Status: AC
  Filled 2023-02-22: qty 30

## 2023-02-22 MED ORDER — DOPAMINE-DEXTROSE 3.2-5 MG/ML-% IV SOLN
INTRAVENOUS | Status: AC
  Filled 2023-02-22: qty 250

## 2023-02-22 MED ORDER — PANTOPRAZOLE SODIUM 40 MG PO TBEC
40.0000 mg | DELAYED_RELEASE_TABLET | Freq: Two times a day (BID) | ORAL | Status: DC
  Administered 2023-02-22 – 2023-02-23 (×2): 40 mg via ORAL
  Filled 2023-02-22 (×2): qty 1

## 2023-02-22 MED ORDER — HEPARIN SODIUM (PORCINE) 1000 UNIT/ML IJ SOLN
INTRAMUSCULAR | Status: AC
  Filled 2023-02-22: qty 10

## 2023-02-22 MED ORDER — MIDAZOLAM HCL 2 MG/2ML IJ SOLN
INTRAMUSCULAR | Status: AC
  Filled 2023-02-22: qty 2

## 2023-02-22 MED ORDER — TICAGRELOR 90 MG PO TABS
ORAL_TABLET | ORAL | Status: DC | PRN
  Administered 2023-02-22: 180 mg via ORAL

## 2023-02-22 MED ORDER — NITROGLYCERIN 0.4 MG SL SUBL: 0.4000 mg | SUBLINGUAL_TABLET | SUBLINGUAL | Status: DC | PRN

## 2023-02-22 MED ORDER — FENTANYL CITRATE (PF) 100 MCG/2ML IJ SOLN
INTRAMUSCULAR | Status: DC | PRN
  Administered 2023-02-22: 25 ug via INTRAVENOUS

## 2023-02-22 MED ORDER — TIROFIBAN (AGGRASTAT) BOLUS VIA INFUSION
INTRAVENOUS | Status: DC | PRN
  Administered 2023-02-22: 1952.5 ug via INTRAVENOUS

## 2023-02-22 MED ORDER — SODIUM CHLORIDE 0.9% FLUSH
3.0000 mL | Freq: Two times a day (BID) | INTRAVENOUS | Status: DC
  Administered 2023-02-22 – 2023-02-23 (×2): 3 mL via INTRAVENOUS

## 2023-02-22 MED ORDER — LIDOCAINE HCL (PF) 1 % IJ SOLN
INTRAMUSCULAR | Status: DC | PRN
  Administered 2023-02-22: 2 mL via INTRADERMAL

## 2023-02-22 MED ORDER — ATROPINE SULFATE 1 MG/10ML IJ SOSY
PREFILLED_SYRINGE | INTRAMUSCULAR | Status: DC | PRN
  Administered 2023-02-22 (×2): .5 mg via INTRAVENOUS

## 2023-02-22 MED ORDER — FENTANYL CITRATE (PF) 100 MCG/2ML IJ SOLN
INTRAMUSCULAR | Status: AC
  Filled 2023-02-22: qty 2

## 2023-02-22 MED ORDER — SODIUM CHLORIDE 0.9 % WEIGHT BASED INFUSION
1.0000 mL/kg/h | INTRAVENOUS | Status: DC
  Administered 2023-02-22: 1 mL/kg/h via INTRAVENOUS

## 2023-02-22 MED ORDER — ORAL CARE MOUTH RINSE: 15.0000 mL | OROMUCOSAL | Status: DC | PRN

## 2023-02-22 MED ORDER — ALPRAZOLAM 0.25 MG PO TABS
0.2500 mg | ORAL_TABLET | Freq: Two times a day (BID) | ORAL | Status: DC | PRN
  Administered 2023-02-22: 0.25 mg via ORAL
  Filled 2023-02-22: qty 1

## 2023-02-22 MED ORDER — HEPARIN (PORCINE) IN NACL 1000-0.9 UT/500ML-% IV SOLN
INTRAVENOUS | Status: DC | PRN
  Administered 2023-02-22 (×2): 500 mL

## 2023-02-22 MED ORDER — TICAGRELOR 90 MG PO TABS
90.0000 mg | ORAL_TABLET | Freq: Two times a day (BID) | ORAL | Status: DC
  Administered 2023-02-23: 90 mg via ORAL
  Filled 2023-02-22: qty 1

## 2023-02-22 MED ORDER — HYDRALAZINE HCL 20 MG/ML IJ SOLN: 10.0000 mg | INTRAMUSCULAR | Status: AC | PRN

## 2023-02-22 MED ORDER — CHLORHEXIDINE GLUCONATE CLOTH 2 % EX PADS
6.0000 | MEDICATED_PAD | Freq: Every day | CUTANEOUS | Status: DC
  Administered 2023-02-22 – 2023-02-23 (×2): 6 via TOPICAL

## 2023-02-22 SURGICAL SUPPLY — 20 items
BALL SAPPHIRE NC24 3.0X15 (BALLOONS) ×1
BALLN EMERGE MR 2.5X12 (BALLOONS) ×1
BALLOON EMERGE MR 2.5X12 (BALLOONS) IMPLANT
BALLOON SAPPHIRE NC24 3.0X15 (BALLOONS) IMPLANT
CATH 5FR JL3.5 JR4 ANG PIG MP (CATHETERS) ×1 IMPLANT
CATH EXTRAC PRONTO 5.5F 138CM (CATHETERS) ×1 IMPLANT
CATH LAUNCHER 6FR AL1 (CATHETERS) IMPLANT
CATHETER LAUNCHER 6FR AL1 (CATHETERS) ×1
DEVICE RAD COMP TR BAND LRG (VASCULAR PRODUCTS) ×1 IMPLANT
GLIDESHEATH SLEND SS 6F .021 (SHEATH) ×1 IMPLANT
GUIDEWIRE INQWIRE 1.5J.035X260 (WIRE) IMPLANT
INQWIRE 1.5J .035X260CM (WIRE) ×1
KIT ENCORE 26 ADVANTAGE (KITS) ×1 IMPLANT
KIT HEART LEFT (KITS) ×2 IMPLANT
KIT HEMO VALVE WATCHDOG (MISCELLANEOUS) ×1 IMPLANT
PACK CARDIAC CATHETERIZATION (CUSTOM PROCEDURE TRAY) ×2 IMPLANT
SHEATH PROBE COVER 6X72 (BAG) ×1 IMPLANT
TRANSDUCER W/STOPCOCK (MISCELLANEOUS) ×2 IMPLANT
TUBING CIL FLEX 10 FLL-RA (TUBING) ×2 IMPLANT
WIRE RUNTHROUGH .014X180CM (WIRE) ×1 IMPLANT

## 2023-02-22 NOTE — ED Provider Notes (Signed)
[place] EMERGENCY DEPARTMENT AT [facility] Provider Note   CSN: [id number] Arrival date & time: 02/22/23  0825     History  Chief Complaint  Patient presents with   Irregular Heart Beat    Jeffrey Norton is a 72 y.o. male.  72 yo M with a cc of chest pain.  Substernal.  Going on for a couple days.  Not exertional.  Some shortness of breath with it but not worse than his baseline shortness of breath.  Has had a chronic cough ongoing for couple years does not think that that significantly changed either.  Has a history of an MI about 20 years ago he thinks it may feel similar.  Denies history of PE or DVT.  Remote history of smoking.  Took 2 baby aspirin's this morning.        Home Medications Prior to Admission medications   Medication Sig Start Date End Date Taking? Authorizing Provider  amLODipine (NORVASC) 5 MG tablet Take 1 tablet (5 mg total) by mouth daily. 07/09/22   Arty Baumgartner, NP  apixaban (ELIQUIS) 5 MG TABS tablet Take 1 tablet (5 mg total) by mouth 2 (two) times daily. 07/08/22   Arty Baumgartner, NP  APPLE CIDER VINEGAR PO Take 1 capsule by mouth daily.    [provider]  ASCORBIC ACID PO Take 1 tablet by mouth daily. Vitamin C, unknown strength.    [provider]  aspirin 81 MG tablet Take 1 tablet (81 mg total) by mouth daily. 07/08/22   Arty Baumgartner, NP  Cholecalciferol (VITAMIN D-3 PO) Take 1 capsule by mouth daily.    [provider]  Coenzyme Q10 (CO Q 10 PO) Take 1 capsule by mouth daily.    [provider]  furosemide (LASIX) 40 MG tablet Take 1 tablet by mouth once daily 09/09/22   Runell Gess, MD  metoprolol tartrate (LOPRESSOR) 50 MG tablet Take 1 tablet (50 mg total) by mouth 2 (two) times daily. 07/08/22   Arty Baumgartner, NP  nitroGLYCERIN (NITROSTAT) 0.4 MG SL tablet Place 1 tablet (0.4 mg total) under the tongue every 5 (five) minutes as needed for chest pain. 07/08/22    Arty Baumgartner, NP  OVER THE COUNTER MEDICATION Take 1 capsule by mouth daily. Black cumin seed oil, supplement.    [provider]  rosuvastatin (CRESTOR) 40 MG tablet Take 1 tablet (40 mg total) by mouth daily. 07/09/22   Arty Baumgartner, NP  VITAMIN K PO Take 1 tablet by mouth daily.    [provider]      Allergies    Patient has no known allergies.    Review of Systems   Review of Systems  Physical Exam Updated Vital Signs BP 127/78   Pulse (!) 51   Temp 97.9 F (36.6 C) (Oral)   Resp (!) 24   Ht 5\' 9"  (1.753 m)   Wt 74.4 kg   SpO2 (!) 89%   BMI 24.22 kg/m  Physical Exam Vitals and nursing note reviewed.  Constitutional:      Appearance: He is well-developed.  HENT:     Head: Normocephalic and atraumatic.  Eyes:     Pupils: Pupils are equal, round, and reactive to light.  Neck:     Vascular: No JVD.     Comments: JVD just above the clavicles.  Diffuse wheezes with prolonged expiratory effort Cardiovascular:     Rate and Rhythm: Normal rate and  regular rhythm.     Heart sounds: No murmur heard.    No friction rub. No gallop.  Pulmonary:     Effort: No respiratory distress.     Breath sounds: No wheezing.  Abdominal:     General: There is no distension.     Tenderness: There is no abdominal tenderness. There is no guarding or rebound.  Musculoskeletal:        General: Normal range of motion.     Cervical back: Normal range of motion and neck supple.  Skin:    Coloration: Skin is not pale.     Findings: No rash.  Neurological:     Mental Status: He is alert and oriented to person, place, and time.  Psychiatric:        Behavior: Behavior normal.     ED Results / Procedures / Treatments   Labs (all labs ordered are listed, but only abnormal results are displayed) Labs Reviewed  BASIC METABOLIC PANEL - Abnormal; Notable for the following components:      Result Value   Potassium 6.0 (*)    Glucose, Bld 138 (*)    Creatinine,  Ser 1.84 (*)    GFR, Estimated 39 (*)    All other components within normal limits  CBC - Abnormal; Notable for the following components:   Hemoglobin 17.1 (*)    HCT 54.1 (*)    All other components within normal limits  BRAIN NATRIURETIC PEPTIDE - Abnormal; Notable for the following components:   B Natriuretic Peptide 560.5 (*)    All other components within normal limits  TROPONIN I (HIGH SENSITIVITY) - Abnormal; Notable for the following components:   Troponin I (High Sensitivity) 5,714 (*)    All other components within normal limits  GLUCOSE, RANDOM  HEPARIN LEVEL (UNFRACTIONATED)  BASIC METABOLIC PANEL  TROPONIN I (HIGH SENSITIVITY)    EKG EKG Interpretation Date/Time:  Monday February 22 2023 08:56:34 EDT Ventricular Rate:  74 PR Interval:  142 QRS Duration:  108 QT Interval:  421 QTC Calculation: 468 R Axis:   126  Text Interpretation: Sinus rhythm Right axis deviation Anteroseptal infarct, old No significant change since last tracing Confirmed by Melene Plan [phone]) on 02/22/2023 8:58:46 AM  Radiology ECHOCARDIOGRAM COMPLETE  Result Date: 02/22/2023    ECHOCARDIOGRAM REPORT   Patient Name:   Jeffrey Norton Date of Exam: 02/22/2023 Medical Rec #:  [id number]        Height:       69.0 in Accession #:    [id number]       Weight:       164.0 lb Date of Birth:  04/23/1951        BSA:          1.899 m Patient Age:    71 years         BP:           151/82 mmHg Patient Gender: M                HR:           49 bpm. Exam Location:  Inpatient Procedure: 2D Echo, Cardiac Doppler, Color Doppler and Intracardiac            Opacification Agent Indications:     Myocardial Infact I21.9  History:         Patient has prior history of Echocardiogram examinations, most  recent 07/08/2022. CHF, CAD and Previous Myocardial Infarction,                  Arrythmias:Atrial Fibrillation and Atrial Flutter; Risk                  Factors:Hypertension and Dyslipidemia.  Sonographer:      Lucendia Herrlich Referring Phys:  [id number] Stade Diagnosing Phys: Charlton Haws MD IMPRESSIONS  1. Global hypokinesis worse in septum apex and inferior basal wall. Left ventricular ejection fraction, by estimation, is 40 to 45%. The left ventricle has mildly decreased function. The left ventricle has no regional wall motion abnormalities. There is  mild left ventricular hypertrophy. Left ventricular diastolic parameters were normal.  2. Right ventricular systolic function is mildly reduced. The right ventricular size is mildly enlarged.  3. Left atrial size was moderately dilated.  4. Right atrial size was mildly dilated.  5. The mitral valve is abnormal. Trivial mitral valve regurgitation. No evidence of mitral stenosis.  6. The aortic valve is tricuspid. There is mild calcification of the aortic valve. There is mild thickening of the aortic valve. Aortic valve regurgitation is not visualized. Aortic valve sclerosis is present, with no evidence of aortic valve stenosis.  7. Aortic dilatation noted. There is mild dilatation of the ascending aorta, measuring 38 mm.  8. The inferior vena cava is dilated in size with >50% respiratory variability, suggesting right atrial pressure of 8 mmHg. FINDINGS  Left Ventricle: Global hypokinesis worse in septum apex and inferior basal wall. Left ventricular ejection fraction, by estimation, is 40 to 45%. The left ventricle has mildly decreased function. The left ventricle has no regional wall motion abnormalities. Definity contrast agent was given IV to delineate the left ventricular endocardial borders. The left ventricular internal cavity size was normal in size. There is mild left ventricular hypertrophy. Left ventricular diastolic parameters were normal. Right Ventricle: The right ventricular size is mildly enlarged. No increase in right ventricular wall thickness. Right ventricular systolic function is mildly reduced. Left Atrium: Left atrial size was moderately dilated.  Right Atrium: Right atrial size was mildly dilated. Pericardium: Trivial pericardial effusion is present. The pericardial effusion is posterior to the left ventricle. Mitral Valve: The mitral valve is abnormal. There is mild thickening of the mitral valve leaflet(s). There is mild calcification of the mitral valve leaflet(s). Trivial mitral valve regurgitation. No evidence of mitral valve stenosis. Tricuspid Valve: The tricuspid valve is normal in structure. Tricuspid valve regurgitation is mild . No evidence of tricuspid stenosis. Aortic Valve: The aortic valve is tricuspid. There is mild calcification of the aortic valve. There is mild thickening of the aortic valve. Aortic valve regurgitation is not visualized. Aortic valve sclerosis is present, with no evidence of aortic valve stenosis. Aortic valve peak gradient measures 7.1 mmHg. Pulmonic Valve: The pulmonic valve was normal in structure. Pulmonic valve regurgitation is not visualized. No evidence of pulmonic stenosis. Aorta: Aortic dilatation noted. There is mild dilatation of the ascending aorta, measuring 38 mm. Venous: The inferior vena cava is dilated in size with greater than 50% respiratory variability, suggesting right atrial pressure of 8 mmHg. IAS/Shunts: No atrial level shunt detected by color flow Doppler.  LEFT VENTRICLE PLAX 2D LVIDd:         5.10 cm   Diastology LVIDs:         4.10 cm   LV e' medial:    5.35 cm/s LV PW:  1.30 cm   LV E/e' medial:  14.9 LV IVS:        1.20 cm   LV e' lateral:   10.80 cm/s LVOT diam:     2.30 cm   LV E/e' lateral: 7.4 LV SV:         60 LV SV Index:   31 LVOT Area:     4.15 cm  RIGHT VENTRICLE            IVC RV S prime:     7.46 cm/s  IVC diam: 2.60 cm TAPSE (M-mode): 1.3 cm LEFT ATRIUM           Index        RIGHT ATRIUM           Index LA diam:      4.40 cm 2.32 cm/m   RA Area:     25.70 cm LA Vol (A2C): 76.8 ml 40.42 ml/m  RA Volume:   87.60 ml  46.13 ml/m LA Vol (A4C): 42.8 ml 22.54 ml/m  AORTIC  VALVE AV Area (Vmax): 2.63 cm AV Vmax:        133.67 cm/s AV Peak Grad:   7.1 mmHg LVOT Vmax:      84.60 cm/s LVOT Vmean:     48.867 cm/s LVOT VTI:       0.144 m  AORTA Ao Root diam: 3.60 cm Ao Asc diam:  3.80 cm MITRAL VALVE               TRICUSPID VALVE MV Area (PHT): 4.31 cm    TR Peak grad:   21.0 mmHg MV Decel Time: 176 msec    TR Vmax:        229.00 cm/s MR Peak grad: 38.9 mmHg MR Vmax:      312.00 cm/s  SHUNTS MV E velocity: 79.90 cm/s  Systemic VTI:  0.14 m MV A velocity: 81.50 cm/s  Systemic Diam: 2.30 cm MV E/A ratio:  0.98 Charlton Haws MD Electronically signed by Charlton Haws MD Signature Date/Time: 02/22/2023/12:29:15 PM    Final (Updated)    DG Chest 2 View  Result Date: 02/22/2023 CLINICAL DATA:  Shortness of breath. Mid chest pain. History of atrial fibrillation. EXAM: CHEST - 2 VIEW COMPARISON:  07/07/2022; 08/12/2012 FINDINGS: Grossly unchanged enlarged cardiac silhouette and mediastinal contours. The pulmonary vasculature appears less distinct than present examination with cephalization of flow. Potential developing airspace opacity within the peripheral aspect of the right upper lung. Interval development of a trace left-sided pleural effusion. No definite right-sided pleural effusion. No pneumothorax. No acute osseous abnormalities. Postcholecystectomy. IMPRESSION: 1. Constellation of findings suggestive of mild pulmonary edema with trace left-sided pleural effusion. 2. Potential developing airspace opacity within the peripheral aspect of the right upper lung, potentially asymmetric alveolar pulmonary edema, though developing infection could have a similar appearance. A follow-up chest radiograph in 3 to 4 weeks after treatment is recommended to ensure resolution. Electronically Signed   By: Simonne Come M.D.   On: 02/22/2023 09:02    Procedures .Critical Care  Performed by: Melene Plan, DO Authorized by: Melene Plan, DO   Critical care provider statement:    Critical care time  (minutes):  80   Critical care time was exclusive of:  Separately billable procedures and treating other patients   Critical care was time spent personally by me on the following activities:  Development of treatment plan with patient or surrogate, discussions with consultants, evaluation of patient's response to treatment, examination  of patient, ordering and review of laboratory studies, ordering and review of radiographic studies, ordering and performing treatments and interventions, pulse oximetry, re-evaluation of patient's condition and review of old charts   Care discussed with: admitting provider       Medications Ordered in ED Medications  heparin ADULT infusion 100 units/mL (25000 units/272mL) (900 Units/hr Intravenous New Bag/Given 02/22/23 1035)  nitroGLYCERIN 50 mg in dextrose 5 % 250 mL (0.2 mg/mL) infusion (5 mcg/min Intravenous New Bag/Given 02/22/23 1036)  0.9% sodium chloride infusion (3 mL/kg/hr  74.4 kg Intravenous Not Given 02/22/23 1227)    Followed by  0.9% sodium chloride infusion (1 mL/kg/hr  74.4 kg Intravenous New Bag/Given 02/22/23 1219)  nitroGLYCERIN (NITROSTAT) SL tablet 0.4 mg (has no administration in time range)  acetaminophen (TYLENOL) tablet 650 mg (has no administration in time range)  ondansetron (ZOFRAN) injection 4 mg (has no administration in time range)  zolpidem (AMBIEN) tablet 5 mg (has no administration in time range)  sodium chloride flush (NS) 0.9 % injection 3 mL (3 mLs Intravenous Given 02/22/23 1130)  sodium chloride flush (NS) 0.9 % injection 3 mL (has no administration in time range)  0.9 %  sodium chloride infusion (has no administration in time range)  ALPRAZolam (XANAX) tablet 0.25 mg (has no administration in time range)  amLODipine (NORVASC) tablet 5 mg (5 mg Oral Given 02/22/23 1220)  metoprolol tartrate (LOPRESSOR) tablet 50 mg (50 mg Oral Given 02/22/23 1223)  rosuvastatin (CRESTOR) tablet 40 mg (40 mg Oral Given 02/22/23 1220)   perflutren lipid microspheres (DEFINITY) IV suspension (2 mLs Intravenous Given 02/22/23 1159)  aspirin chewable tablet 162 mg (162 mg Oral Given 02/22/23 1037)  ipratropium-albuterol (DUONEB) 0.5-2.5 (3) MG/3ML nebulizer solution 3 mL (3 mLs Nebulization Given 02/22/23 1037)  heparin bolus via infusion 4,000 Units (4,000 Units Intravenous Bolus from Bag 02/22/23 1036)  furosemide (LASIX) injection 40 mg (40 mg Intravenous Given 02/22/23 1128)  sodium zirconium cyclosilicate (LOKELMA) packet 10 g (10 g Oral Given 02/22/23 1128)  insulin aspart (novoLOG) injection 5 Units (5 Units Intravenous Given 02/22/23 1129)    And  dextrose 50 % solution 50 mL (50 mLs Intravenous Given 02/22/23 1131)    ED Course/ Medical Decision Making/ A&P                             Medical Decision Making Amount and/or Complexity of Data Reviewed Labs: ordered. Radiology: ordered.  Risk OTC drugs. Prescription drug management. Decision regarding hospitalization.   72 yo M with a cc of chest pain.  This has been going on for couple days.  He has a history of an MI and it is a bit reminiscent of that discomfort.  He denies any exertional symptoms.  Denies any shortness of breath out of what is normal for him denies cough out of what is normal for him.  Will obtain a laboratory evaluation and chest x-ray.  He is wheezing on my exam and so we will give a nebulized treatment as well.  Patient's initial EKG with subtle ST changes.  Less than a millimeter of elevation in inferior leads.  Chest x-ray with signs of possible pulmonary edema as independently interpreted by me.  Does not totally fit the history.  No lower extremity edema, has been coughing but not out of the normal for him. Add BNP  Troponin is resulted and is greater than 5500.  Will treat as an NSTEMI.  With  signs of pulmonary edema I wonder if this is due to the acute MI.  Will discuss with cardiology.  Started on nitro as well with ongoing discomfort.   Patient is also hyperkalemic.  Will temporize.  Cards admit.   The patients results and plan were reviewed and discussed.   Any x-rays performed were independently reviewed by myself.   Differential diagnosis were considered with the presenting HPI.  Medications  heparin ADULT infusion 100 units/mL (25000 units/267mL) (900 Units/hr Intravenous New Bag/Given 02/22/23 1035)  nitroGLYCERIN 50 mg in dextrose 5 % 250 mL (0.2 mg/mL) infusion (5 mcg/min Intravenous New Bag/Given 02/22/23 1036)  0.9% sodium chloride infusion (3 mL/kg/hr  74.4 kg Intravenous Not Given 02/22/23 1227)    Followed by  0.9% sodium chloride infusion (1 mL/kg/hr  74.4 kg Intravenous New Bag/Given 02/22/23 1219)  nitroGLYCERIN (NITROSTAT) SL tablet 0.4 mg (has no administration in time range)  acetaminophen (TYLENOL) tablet 650 mg (has no administration in time range)  ondansetron (ZOFRAN) injection 4 mg (has no administration in time range)  zolpidem (AMBIEN) tablet 5 mg (has no administration in time range)  sodium chloride flush (NS) 0.9 % injection 3 mL (3 mLs Intravenous Given 02/22/23 1130)  sodium chloride flush (NS) 0.9 % injection 3 mL (has no administration in time range)  0.9 %  sodium chloride infusion (has no administration in time range)  ALPRAZolam (XANAX) tablet 0.25 mg (has no administration in time range)  amLODipine (NORVASC) tablet 5 mg (5 mg Oral Given 02/22/23 1220)  metoprolol tartrate (LOPRESSOR) tablet 50 mg (50 mg Oral Given 02/22/23 1223)  rosuvastatin (CRESTOR) tablet 40 mg (40 mg Oral Given 02/22/23 1220)  perflutren lipid microspheres (DEFINITY) IV suspension (2 mLs Intravenous Given 02/22/23 1159)  aspirin chewable tablet 162 mg (162 mg Oral Given 02/22/23 1037)  ipratropium-albuterol (DUONEB) 0.5-2.5 (3) MG/3ML nebulizer solution 3 mL (3 mLs Nebulization Given 02/22/23 1037)  heparin bolus via infusion 4,000 Units (4,000 Units Intravenous Bolus from Bag 02/22/23 1036)  furosemide (LASIX) injection  40 mg (40 mg Intravenous Given 02/22/23 1128)  sodium zirconium cyclosilicate (LOKELMA) packet 10 g (10 g Oral Given 02/22/23 1128)  insulin aspart (novoLOG) injection 5 Units (5 Units Intravenous Given 02/22/23 1129)    And  dextrose 50 % solution 50 mL (50 mLs Intravenous Given 02/22/23 1131)    Vitals:   02/22/23 0930 02/22/23 0945 02/22/23 1000 02/22/23 1015  BP: 133/83 124/73 129/86 127/78  Pulse: (!) 46 (!) 49 (!) 51 (!) 51  Resp: (!) 22 (!) 22 (!) 22 (!) 24  Temp:      TempSrc:      SpO2: (!) 89% 91% (!) 89% (!) 89%  Weight:      Height:        Final diagnoses:  NSTEMI (non-ST elevated myocardial infarction) (HCC)    Admission/ observation were discussed with the admitting physician, patient and/or family and they are comfortable with the plan.          Final Clinical Impression(s) / ED Diagnoses Final diagnoses:  NSTEMI (non-ST elevated myocardial infarction) [facility])    Rx / DC Orders ED Discharge Orders     None         Melene Plan, DO 02/22/23 1235

## 2023-02-22 NOTE — ED Triage Notes (Signed)
Pt here from home with c/o chest pressure and sob ,pt feels like he is in afib ,no n/v  sats 88 % on room air

## 2023-02-22 NOTE — Progress Notes (Addendum)
ANTICOAGULATION CONSULT NOTE  Pharmacy Consult for heparin Indication: chest pain/ACS  No Known Allergies  Patient Measurements: Height: 5\' 9"  (175.3 cm) Weight: 78.1 kg (172 lb 2.9 oz) IBW/kg (Calculated) : 70.7 Heparin Dosing Weight: 74.4kg   Vital Signs: Temp: 97.9 F (36.6 C) (07/29 1536) Temp Source: Oral (07/29 1536) BP: 126/82 (07/29 1536) Pulse Rate: 71 (07/29 1536)  Labs: Recent Labs    02/22/23 0842 02/22/23 1150 02/22/23 1322  HGB 17.1*  --   --   HCT 54.1*  --   --   PLT 197  --   --   CREATININE 1.84*  --  1.79*  TROPONINIHS 5,714* 8,739*  --     Estimated Creatinine Clearance: 37.9 mL/min (A) (by C-G formula based on SCr of 1.79 mg/dL (H)).   Medical History: Past Medical History:  Diagnosis Date   Coronary artery disease    Hyperlipidemia    Hypertension    Myocardial infarction [facility])    PVD (peripheral vascular disease) (HCC)    S/P angioplasty with stent to Lt. iliac 08/15/12 08/15/2012    Medications:  Infusions:   [MAR Hold] sodium chloride     sodium chloride 1 mL/kg/hr (02/22/23 1219)   DOPamine 5 mcg/kg/min (02/22/23 1840)   heparin Stopped (02/22/23 1740)   nitroGLYCERIN Stopped (02/22/23 1826)   sodium chloride 250 mL (02/22/23 1828)   tirofiban 0.075 mcg/kg/min (02/22/23 1818)    Assessment: 71 yom presented to the ED with CP. Pt was previously on oral anticoagulation but had been stopped.  Underwent cardiac cath 7/29 finding likely late presenting inferior MI and underwent angioplasty/thrombectomy - plan to continue anticoagulation for residual thrombus. Plan to restart 2 hours after TR band removed. Was on heparin infusion at 900 units/hr prior to cath. Received 8500 units in cath lab.   Goal of Therapy:  Heparin level 0.3-0.5 units/ml Monitor platelets by anticoagulation protocol: Yes   Plan:  Restart heparin infusion at 800 units/hr 2 hours after TR band removed Check an 8 hr heparin level from restart Daily heparin  level and CBC, s/sx of bleeding  Thank you for allowing pharmacy to participate in this patient's care,  Sherron Monday, PharmD, BCCCP Clinical Pharmacist  Phone: [phone] 02/22/2023 7:17 PM  Please check AMION for all [facility] Pharm[facility] phone numbers After 10:00 PM, call Main Pharmacy [phone]  ADDENDUM  Patient had episode of vomiting that was bloody. Discussed with Cardiology and will stop aggrastat and not start heparin infusion.  Thank you for allowing pharmacy to participate in this patient's care,  Sherron Monday, PharmD, BCCCP Clinical Pharmacist

## 2023-02-22 NOTE — ED Notes (Signed)
Placed pt on 3L 02 per [place] 

## 2023-02-22 NOTE — Interval H&P Note (Signed)
Cath Lab Visit (complete for each Cath Lab visit)  Clinical Evaluation Leading to the Procedure:   ACS: Yes.    Non-ACS:    Anginal Classification: CCS IV  Anti-ischemic medical therapy: Minimal Therapy (1 class of medications)  Non-Invasive Test Results: No non-invasive testing performed  Prior CABG: No previous CABG      History and Physical Interval Note:  02/22/2023 5:45 PM  Jeffrey Norton  has presented today for surgery, with the diagnosis of nstemi.  The various methods of treatment have been discussed with the patient and family. After consideration of risks, benefits and other options for treatment, the patient has consented to  Procedure(s): LEFT HEART CATH AND CORONARY ANGIOGRAPHY (N/A) as a surgical intervention.  The patient's history has been reviewed, patient examined, no change in status, stable for surgery.  I have reviewed the patient's chart and labs.  Questions were answered to the patient's satisfaction.     Lance Muss

## 2023-02-22 NOTE — ED Notes (Signed)
Pt states that he is no longer takes his blood thinner, stopped in December of 2023

## 2023-02-22 NOTE — Progress Notes (Signed)
Echocardiogram 2D Echocardiogram has been performed.  Lucendia Herrlich 02/22/2023, 12:00 PM

## 2023-02-22 NOTE — H&P (Signed)
Physician History and Physical     Patient ID: Jeffrey Norton MRN: [id number] DOB/AGE: Aug 23, 1950 72 y.o. Admit date: 02/22/2023  Primary Care Physician: [name], MD Primary Cardiologist: [name]  Active Problems:   * No active hospital problems. *   HPI:  72 y.o. asked to admit by Dr [name] in ER. History of distant stenting RCA over 20 years ago. Has had chronic cough since Covid in 2021. Has had SSCP, cough and more dyspnea since Saturday. Pain improved with heparin and nitroglycerin in ER but still lingering. ECG to my review shows some ST elevation in leads 3,F compared to [facility] in December. Cath done 07/08/22 by Dr [name] felt non obstructive Long stented area in proximal/mid RCA with 50% diffuse restenosis. He has had PAF but not taken eliquis since December. His troponin is 5500. CXR with mild CHF. Initial K 6 ? Hemolyzed and Cr 1.8 He also has PAD with stenting of his left iliac artery in 2014  Review of systems complete and found to be negative unless listed above   Past Medical History:  Diagnosis Date   Coronary artery disease    Hyperlipidemia    Hypertension    Myocardial infarction [facility])    PVD (peripheral vascular disease) (HCC)    S/P angioplasty with stent to Lt. iliac 08/15/12 08/15/2012    Family History  Problem Relation Age of Onset   Diabetes Mother    Heart disease Mother 49   Hyperlipidemia Mother    Kidney disease Mother    Heart disease Father 59   Cancer Father 76   Stroke Maternal Grandfather        Mid 50s   Lung disease Paternal Grandfather        Black Lung Disease    Social History   Socioeconomic History   Marital status: Married    Spouse name: Not on file   Number of children: Not on file   Years of education: Not on file   Highest education level: Not on file  Occupational History   Not on file  Tobacco Use   Smoking status: Former    Current packs/day: 0.00    Types: Cigarettes    Quit date: 08/16/1998    Years since  quitting: 24.5   Smokeless tobacco: Never  Vaping Use   Vaping status: Never Used  Substance and Sexual Activity   Alcohol use: No   Drug use: No   Sexual activity: Not on file  Other Topics Concern   Not on file  Social History Narrative   Not on file   Social Determinants of Health   Financial Resource Strain: Not on file  Food Insecurity: No Food Insecurity (07/07/2022)   Hunger Vital Sign    Worried About Running Out of Food in the Last Year: Never true    Ran Out of Food in the Last Year: Never true  Transportation Needs: No Transportation Needs (07/07/2022)   PRAPARE - Administrator, Civil Service (Medical): No    Lack of Transportation (Non-Medical): No  Physical Activity: Not on file  Stress: Not on file  Social Connections: Not on file  Intimate Partner Violence: Not At Risk (07/07/2022)   Humiliation, Afraid, Rape, and Kick questionnaire    Fear of Current or Ex-Partner: No    Emotionally Abused: No    Physically Abused: No    Sexually Abused: No    Past Surgical History:  Procedure Laterality Date   2D Echocardiogram  08/25/2004   EF >55%, LA-moderately dilated.   CARDIAC CATHETERIZATION  03/11/2002   RCA subtotally occluded with TIMI-1 to1/2 flow stented with a 3x23 Jeffrey DES resulting in reduction of occlusion to 0% with TIMI-3 flow   LEA Doppler  08/29/2012   L SFA Proximal: 70-99% diameter reduction. Velocities suggest upper end of range-new finding when compared to previous study. L EIA stent: opne and patent without evidence of restenosis   LEFT HEART CATH AND CORONARY ANGIOGRAPHY N/A 07/07/2022   Procedure: LEFT HEART CATH AND CORONARY ANGIOGRAPHY;  Surgeon: [name],  M, MD;  Location: [facility] INVASIVE CV LAB;  Service: Cardiovascular;  Laterality: N/A;   Lexiscan Myoview  07/19/2012   No significant ECG changes, mild inferobasal hypokinesis with normal LV function, low risk stress nuclear study   PV Intervention  08/15/2012   Occluded L  External Iliac-stented with a 10x35mm Abbott Nitinol Absolute Pro self-expanding stent. 75% stenosis in L Common Femoral artery stented witha 8x36mm lon Absolute Nitinol Absolute Po self-expanding stent.      Physical Exam: Blood pressure (!) 151/82, pulse 75, temperature 97.9 F (36.6 C), temperature source Oral, resp. rate 18, height 5\' 9"  (1.753 m), weight 74.4 kg, SpO2 91%.    Affect appropriate Elderly male  HEENT: normal Neck supple with no adenopathy JVP normal no bruits no thyromegaly Lungs clear with no wheezing and good diaphragmatic motion Heart:  S1/S2 no murmur, no rub, gallop or click PMI normal Abdomen: benighn, BS positve, no tenderness, no AAA no bruit.  No HSM or HJR Venous discoloration of LE;s with palpable DP bilateral  No edema Neuro non-focal Skin warm and dry No muscular weakness  No current facility-administered medications on file prior to encounter.   Current Outpatient Medications on File Prior to Encounter  Medication Sig Dispense Refill   amLODipine (NORVASC) 5 MG tablet Take 1 tablet (5 mg total) by mouth daily. 90 tablet 0   apixaban (ELIQUIS) 5 MG TABS tablet Take 1 tablet (5 mg total) by mouth 2 (two) times daily. 180 tablet 1   APPLE CIDER VINEGAR PO Take 1 capsule by mouth daily.     ASCORBIC ACID PO Take 1 tablet by mouth daily. Vitamin C, unknown strength.     aspirin 81 MG tablet Take 1 tablet (81 mg total) by mouth daily. 30 tablet    Cholecalciferol (VITAMIN D-3 PO) Take 1 capsule by mouth daily.     Coenzyme Q10 (CO Q 10 PO) Take 1 capsule by mouth daily.     furosemide (LASIX) 40 MG tablet Take 1 tablet by mouth once daily 90 tablet 2   metoprolol tartrate (LOPRESSOR) 50 MG tablet Take 1 tablet (50 mg total) by mouth 2 (two) times daily. 180 tablet 1   nitroGLYCERIN (NITROSTAT) 0.4 MG SL tablet Place 1 tablet (0.4 mg total) under the tongue every 5 (five) minutes as needed for chest pain. 25 tablet 1   OVER THE COUNTER MEDICATION Take 1  capsule by mouth daily. Black cumin seed oil, supplement.     rosuvastatin (CRESTOR) 40 MG tablet Take 1 tablet (40 mg total) by mouth daily. 90 tablet 1   VITAMIN K PO Take 1 tablet by mouth daily.      Labs:   Lab Results  Component Value Date   WBC 10.1 02/22/2023   HGB 17.1 (H) 02/22/2023   HCT 54.1 (H) 02/22/2023   MCV 97.8 02/22/2023   PLT 197 02/22/2023    Recent Labs  Lab 02/22/23 [phone]  NA 136  K 6.0*  CL 98  CO2 25  BUN 19  CREATININE 1.84*  CALCIUM 9.1  GLUCOSE 138*   No results found for: "CKTOTAL", "CKMB", "CKMBINDEX", "TROPONINI"   Lab Results  Component Value Date   CHOL 107 07/08/2022   CHOL 177 04/24/2013   Lab Results  Component Value Date   HDL 23 (L) 07/08/2022   HDL 32 (L) 04/24/2013   Lab Results  Component Value Date   LDLCALC 73 07/08/2022   LDLCALC 118 (H) 04/24/2013   Lab Results  Component Value Date   TRIG 56 07/08/2022   TRIG 133 04/24/2013   Lab Results  Component Value Date   CHOLHDL 4.7 07/08/2022   CHOLHDL 5.5 04/24/2013   No results found for: "LDLDIRECT"     Radiology: DG Chest 2 View  Result Date: 02/22/2023 CLINICAL DATA:  Shortness of breath. Mid chest pain. History of atrial fibrillation. EXAM: CHEST - 2 VIEW COMPARISON:  07/07/2022; 08/12/2012 FINDINGS: Grossly unchanged enlarged cardiac silhouette and mediastinal contours. The pulmonary vasculature appears less distinct than present examination with cephalization of flow. Potential developing airspace opacity within the peripheral aspect of the right upper lung. Interval development of a trace left-sided pleural effusion. No definite right-sided pleural effusion. No pneumothorax. No acute osseous abnormalities. Postcholecystectomy. IMPRESSION: 1. Constellation of findings suggestive of mild pulmonary edema with trace left-sided pleural effusion. 2. Potential developing airspace opacity within the peripheral aspect of the right upper lung, potentially asymmetric  alveolar pulmonary edema, though developing infection could have a similar appearance. A follow-up chest radiograph in 3 to 4 weeks after treatment is recommended to ensure resolution. Electronically Signed   By: Simonne Come M.D.   On: 02/22/2023 09:02    EKG: SR resolving ST elevation in lead 3,F  ASSESSMENT AND PLAN:  MI:  ? Subacute ST elevation inferior MI starting Saturday. Troponin 5500 lingering pain. Discussed semi emergent cath with patient and wife. Repeat K make sure it is < 5.5. ? Hemolyzed. Right heart cath to assess filling pressures given CXR with ? CHF and elevated Cr. His lung exam has basilar crackles BNP pending Continue heparin and nitroglycerin iv.   Shared Decision Making/Informed Consent The risks [stroke (1 in 1000), death (1 in 1000), kidney failure [usually temporary] (1 in 500), bleeding (1 in 200), allergic reaction [possibly serious] (1 in 200)], benefits (diagnostic support and management of coronary artery disease) and alternatives of a cardiac catheterization were discussed in detail with Ms. Wanita Chamberlain and she is willing to proceed.   2.  Pulmonary: CXR mild CHF His history suggests long covid syndrome Consider f/u non contrast chest CT r/o ILD   3.  PVD:  f/u [facility] Prior left iliac stent Needs more updated ABI's  4.  PAF:  has not been taking eliquis since December post cath NSR in ER   Signed: Theron Arista Nishan7/29/2024, 10:59 AM

## 2023-02-22 NOTE — Progress Notes (Signed)
I was called to the bedside by nursing team after patient had an episode of hematemesis of unclear quantity.  On bedside assessment, patient appeared comfortable with vital signs within normal limits. His abdominal exam revealed a soft nontender, mildly distended abdomen. He denied any active chest pain, dyspnea/tachypnea, irregular heart beat/palpitations, pr presyncope/syncope. He actually reported feeling much better after his episode of emesis.  EKG showed persistent inferior ST elevation similar to his EKG performed after his cath Lab procedure.  I discussed with interventional cardiology and we agreed on discontinuing his Aggrastat and IV heparin tonight.  He will monitor vital signs closely, check CBC every 4-6 hours, as well as check basic labs and ensure that we have an active type and screen.

## 2023-02-22 NOTE — ED Triage Notes (Signed)
Sats up to 91 % on room air

## 2023-02-22 NOTE — Progress Notes (Signed)
eLink Physician-Brief Progress Note Patient Name: Jeffrey Norton DOB: 03/12/1951 MRN: [id number]   Date of Service  02/22/2023  HPI/Events of Note  72 year old male with a history of coronary artery disease, peripheral vascular disease and metabolic syndrome who initially presented to the emergency department with chest pain found to have STEMI in the inferior region.  Taken emergently to catheterization.  Troponin elevation, creatinine elevation, BNP elevation.  ECG reviewed.  S/p RCA balloon angioplasty   eICU Interventions  Dual antiplatelet Currently on Dopamine Scds for DVT prophylaxis No intervention indicated.      Intervention Category Evaluation Type: New Patient Evaluation    02/22/2023, 10:35 PM

## 2023-02-22 NOTE — Progress Notes (Signed)
   02/22/23 1536  Vitals  Temp 97.9 F (36.6 C)  Temp Source Oral  BP 126/82  MAP (mmHg) 91  BP Location Right Arm  BP Method Automatic  Patient Position (if appropriate) Sitting  Pulse Rate 71  Pulse Rate Source Monitor  ECG Heart Rate 71  Resp 20  Level of Consciousness  Level of Consciousness Alert  MEWS COLOR  MEWS Score Color Green  Oxygen Therapy  SpO2 96 %  O2 Device Nasal Cannula  Pain Assessment  Pain Scale 0-10  Pain Score 3  Pain Type Acute pain  Pain Location Chest  Pain Orientation Mid  Pain Descriptors / Indicators Pressure  Pain Frequency Intermittent  Pain Onset On-going  PCA/Epidural/Spinal Assessment  Respiratory Pattern Regular;Unlabored  Height and Weight  Height 5\' 9"  (1.753 m)  Weight 78.1 kg  Type of Scale Used Standing  Type of Weight Actual  BSA (Calculated - sq m) 1.95 sq meters  BMI (Calculated) 25.41  Weight in (lb) to have BMI = 25 168.9  ECG Monitoring  Telemetry Box Number [id number]  Tele Box Verification Completed by Second Verifier Completed  MEWS Score  MEWS Temp 0  MEWS Systolic 0  MEWS Pulse 0  MEWS RR 0  MEWS LOC 0  MEWS Score 0   Patient arrived from ED with a 3/10 chest pressure, awaiting cath procedure. RN provided [facility] per order. Patient has no questions about procedure, patient signed informed consent. RN prepped patient for cath procedure.

## 2023-02-22 NOTE — Plan of Care (Signed)
  Problem: Education: Goal: Individualized Educational Video(s) Outcome: Progressing   Problem: Activity: Goal: Ability to return to baseline activity level will improve Outcome: Progressing   Problem: Cardiovascular: Goal: Ability to achieve and maintain adequate cardiovascular perfusion will improve Outcome: Progressing

## 2023-02-22 NOTE — Progress Notes (Signed)
ANTICOAGULATION CONSULT NOTE - Initial Consult  Pharmacy Consult for heparin Indication: chest pain/ACS  No Known Allergies  Patient Measurements: Height: 5\' 9"  (175.3 cm) Weight: 74.4 kg (164 lb) IBW/kg (Calculated) : 70.7 Heparin Dosing Weight: 74.4kg   Vital Signs: Temp: 97.9 F (36.6 C) (07/29 0833) Temp Source: Oral (07/29 0833) BP: 151/82 (07/29 0833) Pulse Rate: 75 (07/29 0833)  Labs: Recent Labs    02/22/23 0842  HGB 17.1*  HCT 54.1*  PLT 197  CREATININE 1.84*  TROPONINIHS 5,714*    Estimated Creatinine Clearance: 36.8 mL/min (A) (by C-G formula based on SCr of 1.84 mg/dL (H)).   Medical History: Past Medical History:  Diagnosis Date   Coronary artery disease    Hyperlipidemia    Hypertension    Myocardial infarction [facility])    PVD (peripheral vascular disease) (HCC)    S/P angioplasty with stent to Lt. iliac 08/15/12 08/15/2012    Medications:  Infusions:   heparin     nitroGLYCERIN 5 mcg/min (02/22/23 1009)    Assessment: 71 yom presented to the ED with CP. Troponin elevated and now starting IV heparin. Baseline Hgb is elevated and platelets are WNL. Pt was previously on oral anticoagulation but had been stopped.  Goal of Therapy:  Heparin level 0.3-0.7 units/ml Monitor platelets by anticoagulation protocol: Yes   Plan:  Heparin bolus 4000 units IV x 1 Heparin gtt 900 units/hr Check an 8 hr heparin level Daily heparin level and CBC  , Drake Leach 02/22/2023,9:49 AM

## 2023-02-22 NOTE — ED Notes (Signed)
ED TO INPATIENT HANDOFF REPORT  ED Nurse Name and Phone #: jenn [phone]  S Name/Age/Gender Jeffrey Norton 72 y.o. male Room/Bed: 010C/010C  Code Status   Code Status: Full Code  Home/SNF/Other Home Patient oriented to: self, place, time, and situation Is this baseline? Yes   Triage Complete: Triage complete  Chief Complaint Unstable angina (HCC) [I20.0]  Triage Note Pt here from home with c/o chest pressure and sob ,pt feels like he is in afib ,no n/v  sats 88 % on room air   Sats up to 91 % on room air    Allergies No Known Allergies  Level of Care/Admitting Diagnosis ED Disposition     ED Disposition  Admit   Condition  --   Comment  Hospital Area: [facility] [[id number]]  Level of Care: Telemetry Cardiac [[id number]]  May place patient in observation at [facility] or [facility] if equivalent level of care is available:: No  Covid Evaluation: Asymptomatic - no recent exposure (last 10 days) testing not required  Diagnosis: Unstable angina [facility]) [[id number]]  Admitting Physician: Vivien Presto  Attending Physician: Wendall Stade [[id number]]          B Medical/Surgery History Past Medical History:  Diagnosis Date   Coronary artery disease    Hyperlipidemia    Hypertension    Myocardial infarction (HCC)    PVD (peripheral vascular disease) (HCC)    S/P angioplasty with stent to Lt. iliac 08/15/12 08/15/2012   Past Surgical History:  Procedure Laterality Date   2D Echocardiogram  08/25/2004   EF >55%, LA-moderately dilated.   CARDIAC CATHETERIZATION  03/11/2002   RCA subtotally occluded with TIMI-1 to1/2 flow stented with a 3x23 Cypher DES resulting in reduction of occlusion to 0% with TIMI-3 flow   LEA Doppler  08/29/2012   L SFA Proximal: 70-99% diameter reduction. Velocities suggest upper end of range-new finding when compared to previous study. L EIA stent: opne and patent without evidence of restenosis   LEFT HEART CATH AND CORONARY  ANGIOGRAPHY N/A 07/07/2022   Procedure: LEFT HEART CATH AND CORONARY ANGIOGRAPHY;  Surgeon: [name], Peter M, MD;  Location: [facility] INVASIVE CV LAB;  Service: Cardiovascular;  Laterality: N/A;   Lexiscan Myoview  07/19/2012   No significant ECG changes, mild inferobasal hypokinesis with normal LV function, low risk stress nuclear study   PV Intervention  08/15/2012   Occluded L External Iliac-stented with a 10x70mm Abbott Nitinol Absolute Pro self-expanding stent. 75% stenosis in L Common Femoral artery stented witha 8x54mm lon Absolute Nitinol Absolute Po self-expanding stent.     A IV Location/Drains/Wounds Patient Lines/Drains/Airways Status     Active Line/Drains/Airways     Name Placement date Placement time Site Days   Peripheral IV 02/22/23 20 G Anterior;Distal;Right;Upper Arm 02/22/23  0938  Arm  less than 1   Peripheral IV 02/22/23 20 G Left Antecubital 02/22/23  1148  Antecubital  less than 1            Intake/Output Last 24 hours No intake or output data in the 24 hours ending 02/22/23 1431  Labs/Imaging Results for orders placed or performed during the hospital encounter of 02/22/23 (from the past 48 hour(s))  Basic metabolic panel     Status: Abnormal   Collection Time: 02/22/23  8:42 AM  Result Value Ref Range   Sodium 136 135 - 145 mmol/L   Potassium 6.0 (H) 3.5 - 5.1 mmol/L   Chloride 98 98 -  111 mmol/L   CO2 25 22 - 32 mmol/L   Glucose, Bld 138 (H) 70 - 99 mg/dL    Comment: Glucose reference range applies only to samples taken after fasting for at least 8 hours.   BUN 19 8 - 23 mg/dL   Creatinine, Ser 1.47 (H) 0.61 - 1.24 mg/dL   Calcium 9.1 8.9 - 82.9 mg/dL   GFR, Estimated 39 (L) >60 mL/min    Comment: (NOTE) Calculated using the CKD-EPI Creatinine Equation (2021)    Anion gap 13 5 - 15    Comment: Performed at [facility], [address], [place], [place] [address]  CBC     Status: Abnormal   Collection Time: 02/22/23  8:42 AM  Result Value Ref  Range   WBC 10.1 4.0 - 10.5 K/uL   RBC 5.53 4.22 - 5.81 MIL/uL   Hemoglobin 17.1 (H) 13.0 - 17.0 g/dL   HCT 08.6 (H) 57.8 - 46.9 %   MCV 97.8 80.0 - 100.0 fL   MCH 30.9 26.0 - 34.0 pg   MCHC 31.6 30.0 - 36.0 g/dL   RDW 62.9 52.8 - 41.3 %   Platelets 197 150 - 400 K/uL   nRBC 0.0 0.0 - 0.2 %    Comment: Performed at [facility], [address] [place], [place] [address]  Troponin I (High Sensitivity)     Status: Abnormal   Collection Time: 02/22/23  8:42 AM  Result Value Ref Range   Troponin I (High Sensitivity) 5,714 (HH) <18 ng/L    Comment: CRITICAL RESULT CALLED TO, READ BACK BY AND VERIFIED WITH Burtis Junes RN [phone] M.ALAMANO (NOTE) Elevated high sensitivity troponin I (hsTnI) values and significant  changes across serial measurements may suggest ACS but many other  chronic and acute conditions are known to elevate hsTnI results.  Refer to the "Links" section for chest pain algorithms and additional  guidance. Performed at [facility], [address], [place], [place] [address]   Brain natriuretic peptide     Status: Abnormal   Collection Time: 02/22/23 11:50 AM  Result Value Ref Range   B Natriuretic Peptide 560.5 (H) 0.0 - 100.0 pg/mL    Comment: Performed at [facility], [address] [place], [place] [address]  Troponin I (High Sensitivity)     Status: Abnormal   Collection Time: 02/22/23 11:50 AM  Result Value Ref Range   Troponin I (High Sensitivity) 8,739 (HH) <18 ng/L    Comment: RESULT CALLED TO, READ BACK BY AND VERIFIED WITH NOTIFIED Burtis Junes RN [phone] M.ALAMANO (NOTE) Elevated high sensitivity troponin I (hsTnI) values and significant  changes across serial measurements may suggest ACS but many other  chronic and acute conditions are known to elevate hsTnI results.  Refer to the "Links" section for chest pain algorithms and additional  guidance. Performed at [facility], [address] [place], [place] [address]    Glucose, random     Status: None   Collection Time: 02/22/23  1:21 PM  Result Value Ref Range   Glucose, Bld 96 70 - 99 mg/dL    Comment: Glucose reference range applies only to samples taken after fasting for at least 8 hours. Performed at [facility], [address] [place], [place] [address]   Basic metabolic panel     Status: Abnormal   Collection Time: 02/22/23  1:22 PM  Result Value Ref Range   Sodium  136 135 - 145 mmol/L   Potassium 5.4 (H) 3.5 - 5.1 mmol/L   Chloride 105 98 - 111 mmol/L   CO2 19 (L) 22 - 32 mmol/L   Glucose, Bld 99 70 - 99 mg/dL    Comment: Glucose reference range applies only to samples taken after fasting for at least 8 hours.   BUN 23 8 - 23 mg/dL   Creatinine, Ser 6.38 (H) 0.61 - 1.24 mg/dL   Calcium 8.4 (L) 8.9 - 10.3 mg/dL   GFR, Estimated 40 (L) >60 mL/min    Comment: (NOTE) Calculated using the CKD-EPI Creatinine Equation (2021)    Anion gap 12 5 - 15    Comment: Performed at [facility], [address] [place], [place] [address]   ECHOCARDIOGRAM COMPLETE  Result Date: 02/22/2023    ECHOCARDIOGRAM REPORT   Patient Name:   WRIGLEY LEMA Date of Exam: 02/22/2023 Medical Rec #:  [id number]        Height:       69.0 in Accession #:    [id number]       Weight:       164.0 lb Date of Birth:  [birth year]        BSA:          1.899 m Patient Age:    71 years         BP:           151/82 mmHg Patient Gender: M                HR:           49 bpm. Exam Location:  Inpatient Procedure: 2D Echo, Cardiac Doppler, Color Doppler and Intracardiac            Opacification Agent Indications:     Myocardial Infact I21.9  History:         Patient has prior history of Echocardiogram examinations, most                  recent 07/08/2022. CHF, CAD and Previous Myocardial Infarction,                  Arrythmias:Atrial Fibrillation and Atrial Flutter; Risk                  Factors:Hypertension and Dyslipidemia.  Sonographer:     Lucendia Herrlich Referring Phys:   [id number] Wendall Stade Diagnosing Phys: Charlton Haws MD IMPRESSIONS  1. Global hypokinesis worse in septum apex and inferior basal wall. Left ventricular ejection fraction, by estimation, is 40 to 45%. The left ventricle has mildly decreased function. The left ventricle has no regional wall motion abnormalities. There is  mild left ventricular hypertrophy. Left ventricular diastolic parameters were normal.  2. Right ventricular systolic function is mildly reduced. The right ventricular size is mildly enlarged.  3. Left atrial size was moderately dilated.  4. Right atrial size was mildly dilated.  5. The mitral valve is abnormal. Trivial mitral valve regurgitation. No evidence of mitral stenosis.  6. The aortic valve is tricuspid. There is mild calcification of the aortic valve. There is mild thickening of the aortic valve. Aortic valve regurgitation is not visualized. Aortic valve sclerosis is present, with no evidence of aortic valve stenosis.  7. Aortic dilatation noted. There is mild dilatation of the ascending aorta, measuring 38 mm.  8. The inferior vena cava is dilated in size with >50% respiratory variability, suggesting right atrial pressure of  8 mmHg. FINDINGS  Left Ventricle: Global hypokinesis worse in septum apex and inferior basal wall. Left ventricular ejection fraction, by estimation, is 40 to 45%. The left ventricle has mildly decreased function. The left ventricle has no regional wall motion abnormalities. Definity contrast agent was given IV to delineate the left ventricular endocardial borders. The left ventricular internal cavity size was normal in size. There is mild left ventricular hypertrophy. Left ventricular diastolic parameters were normal. Right Ventricle: The right ventricular size is mildly enlarged. No increase in right ventricular wall thickness. Right ventricular systolic function is mildly reduced. Left Atrium: Left atrial size was moderately dilated. Right Atrium: Right atrial size  was mildly dilated. Pericardium: Trivial pericardial effusion is present. The pericardial effusion is posterior to the left ventricle. Mitral Valve: The mitral valve is abnormal. There is mild thickening of the mitral valve leaflet(s). There is mild calcification of the mitral valve leaflet(s). Trivial mitral valve regurgitation. No evidence of mitral valve stenosis. Tricuspid Valve: The tricuspid valve is normal in structure. Tricuspid valve regurgitation is mild . No evidence of tricuspid stenosis. Aortic Valve: The aortic valve is tricuspid. There is mild calcification of the aortic valve. There is mild thickening of the aortic valve. Aortic valve regurgitation is not visualized. Aortic valve sclerosis is present, with no evidence of aortic valve stenosis. Aortic valve peak gradient measures 7.1 mmHg. Pulmonic Valve: The pulmonic valve was normal in structure. Pulmonic valve regurgitation is not visualized. No evidence of pulmonic stenosis. Aorta: Aortic dilatation noted. There is mild dilatation of the ascending aorta, measuring 38 mm. Venous: The inferior vena cava is dilated in size with greater than 50% respiratory variability, suggesting right atrial pressure of 8 mmHg. IAS/Shunts: No atrial level shunt detected by color flow Doppler.  LEFT VENTRICLE PLAX 2D LVIDd:         5.10 cm   Diastology LVIDs:         4.10 cm   LV e' medial:    5.35 cm/s LV PW:         1.30 cm   LV E/e' medial:  14.9 LV IVS:        1.20 cm   LV e' lateral:   10.80 cm/s LVOT diam:     2.30 cm   LV E/e' lateral: 7.4 LV SV:         60 LV SV Index:   31 LVOT Area:     4.15 cm  RIGHT VENTRICLE            IVC RV S prime:     7.46 cm/s  IVC diam: 2.60 cm TAPSE (M-mode): 1.3 cm LEFT ATRIUM           Index        RIGHT ATRIUM           Index LA diam:      4.40 cm 2.32 cm/m   RA Area:     25.70 cm LA Vol (A2C): 76.8 ml 40.42 ml/m  RA Volume:   87.60 ml  46.13 ml/m LA Vol (A4C): 42.8 ml 22.54 ml/m  AORTIC VALVE AV Area (Vmax): 2.63 cm AV  Vmax:        133.67 cm/s AV Peak Grad:   7.1 mmHg LVOT Vmax:      84.60 cm/s LVOT Vmean:     48.867 cm/s LVOT VTI:       0.144 m  AORTA Ao Root diam: 3.60 cm Ao Asc diam:  3.80 cm MITRAL VALVE  TRICUSPID VALVE MV Area (PHT): 4.31 cm    TR Peak grad:   21.0 mmHg MV Decel Time: 176 msec    TR Vmax:        229.00 cm/s MR Peak grad: 38.9 mmHg MR Vmax:      312.00 cm/s  SHUNTS MV E velocity: 79.90 cm/s  Systemic VTI:  0.14 m MV A velocity: 81.50 cm/s  Systemic Diam: 2.30 cm MV E/A ratio:  0.98 Charlton Haws MD Electronically signed by Charlton Haws MD Signature Date/Time: 02/22/2023/12:29:15 PM    Final (Updated)    DG Chest 2 View  Result Date: 02/22/2023 CLINICAL DATA:  Shortness of breath. Mid chest pain. History of atrial fibrillation. EXAM: CHEST - 2 VIEW COMPARISON:  07/07/2022; 08/12/2012 FINDINGS: Grossly unchanged enlarged cardiac silhouette and mediastinal contours. The pulmonary vasculature appears less distinct than present examination with cephalization of flow. Potential developing airspace opacity within the peripheral aspect of the right upper lung. Interval development of a trace left-sided pleural effusion. No definite right-sided pleural effusion. No pneumothorax. No acute osseous abnormalities. Postcholecystectomy. IMPRESSION: 1. Constellation of findings suggestive of mild pulmonary edema with trace left-sided pleural effusion. 2. Potential developing airspace opacity within the peripheral aspect of the right upper lung, potentially asymmetric alveolar pulmonary edema, though developing infection could have a similar appearance. A follow-up chest radiograph in 3 to 4 weeks after treatment is recommended to ensure resolution. Electronically Signed   By: Simonne Come M.D.   On: 02/22/2023 09:02    Pending Labs Unresulted Labs (From admission, onward)     Start     Ordered   02/23/23 0500  Heparin level (unfractionated)  Daily,   R      02/22/23 1018   02/23/23 0500  CBC  Daily,    R      02/22/23 1018   02/23/23 0500  Lipoprotein A (LPA)  Tomorrow morning,   R        02/22/23 1110   02/22/23 1830  Heparin level (unfractionated)  Once-Timed,   URGENT        02/22/23 1018            Vitals/Pain Today's Vitals   02/22/23 1332 02/22/23 1333 02/22/23 1334 02/22/23 1415  BP:    122/87  Pulse: 70 (!) 56 70 67  Resp: (!) 24 (!) 22 15 (!) 22  Temp:      TempSrc:      SpO2: (!) 89% (!) 88% 90% 97%  Weight:      Height:      PainSc:        Isolation Precautions No active isolations  Medications Medications  heparin ADULT infusion 100 units/mL (25000 units/265mL) (900 Units/hr Intravenous New Bag/Given 02/22/23 1035)  nitroGLYCERIN 50 mg in dextrose 5 % 250 mL (0.2 mg/mL) infusion (5 mcg/min Intravenous New Bag/Given 02/22/23 1036)  0.9% sodium chloride infusion (3 mL/kg/hr  74.4 kg Intravenous Not Given 02/22/23 1227)    Followed by  0.9% sodium chloride infusion (1 mL/kg/hr  74.4 kg Intravenous New Bag/Given 02/22/23 1219)  nitroGLYCERIN (NITROSTAT) SL tablet 0.4 mg (has no administration in time range)  acetaminophen (TYLENOL) tablet 650 mg (has no administration in time range)  ondansetron (ZOFRAN) injection 4 mg (has no administration in time range)  zolpidem (AMBIEN) tablet 5 mg (has no administration in time range)  sodium chloride flush (NS) 0.9 % injection 3 mL (3 mLs Intravenous Given 02/22/23 1130)  sodium chloride flush (NS) 0.9 % injection 3 mL (  has no administration in time range)  0.9 %  sodium chloride infusion (has no administration in time range)  ALPRAZolam (XANAX) tablet 0.25 mg (has no administration in time range)  amLODipine (NORVASC) tablet 5 mg (5 mg Oral Given 02/22/23 1220)  metoprolol tartrate (LOPRESSOR) tablet 50 mg (50 mg Oral Given 02/22/23 1223)  rosuvastatin (CRESTOR) tablet 40 mg (40 mg Oral Given 02/22/23 1220)  perflutren lipid microspheres (DEFINITY) IV suspension (2 mLs Intravenous Given 02/22/23 1159)  aspirin chewable  tablet 162 mg (162 mg Oral Given 02/22/23 1037)  ipratropium-albuterol (DUONEB) 0.5-2.5 (3) MG/3ML nebulizer solution 3 mL (3 mLs Nebulization Given 02/22/23 1037)  heparin bolus via infusion 4,000 Units (4,000 Units Intravenous Bolus from Bag 02/22/23 1036)  furosemide (LASIX) injection 40 mg (40 mg Intravenous Given 02/22/23 1128)  sodium zirconium cyclosilicate (LOKELMA) packet 10 g (10 g Oral Given 02/22/23 1128)  insulin aspart (novoLOG) injection 5 Units (5 Units Intravenous Given 02/22/23 1129)    And  dextrose 50 % solution 50 mL (50 mLs Intravenous Given 02/22/23 1131)    Mobility walks       R Recommendations: See Admitting Provider Note  Report given to:   Additional Notes: had midchest pressure, palps, and SOB, felt like he was in afib, trops 8739, BNP 560, on 2L [place], nitroglycerin gtt@ 83mcg/min and heparin 900 units/mL, A&Ox4, pressure is better since gtt

## 2023-02-23 ENCOUNTER — Encounter (HOSPITAL_COMMUNITY): Payer: Self-pay | Admitting: Interventional Cardiology

## 2023-02-23 ENCOUNTER — Other Ambulatory Visit (HOSPITAL_COMMUNITY): Payer: Self-pay

## 2023-02-23 ENCOUNTER — Other Ambulatory Visit: Payer: Self-pay

## 2023-02-23 DIAGNOSIS — I2111 ST elevation (STEMI) myocardial infarction involving right coronary artery: Secondary | ICD-10-CM

## 2023-02-23 LAB — COMPREHENSIVE METABOLIC PANEL
ALT: 108 U/L — ABNORMAL HIGH (ref 0–44)
AST: 242 U/L — ABNORMAL HIGH (ref 15–41)
Albumin: 2.5 g/dL — ABNORMAL LOW (ref 3.5–5.0)
Alkaline Phosphatase: 67 U/L (ref 38–126)
Anion gap: 10 (ref 5–15)
BUN: 43 mg/dL — ABNORMAL HIGH (ref 8–23)
CO2: 22 mmol/L (ref 22–32)
Calcium: 7.9 mg/dL — ABNORMAL LOW (ref 8.9–10.3)
Chloride: 105 mmol/L (ref 98–111)
Creatinine, Ser: 1.76 mg/dL — ABNORMAL HIGH (ref 0.61–1.24)
GFR, Estimated: 41 mL/min — ABNORMAL LOW (ref >60)
Glucose, Bld: 94 mg/dL (ref 70–99)
Potassium: 5.5 mmol/L — ABNORMAL HIGH (ref 3.5–5.1)
Sodium: 137 mmol/L (ref 135–145)
Total Bilirubin: 0.7 mg/dL (ref 0.3–1.2)
Total Protein: 6.5 g/dL (ref 6.5–8.1)

## 2023-02-23 LAB — CBC
HCT: 43.5 % (ref 39.0–52.0)
HCT: 41 % (ref 39.0–52.0)
HCT: 41.7 % (ref 39.0–52.0)
Hemoglobin: 14.2 g/dL (ref 13.0–17.0)
Hemoglobin: 13.3 g/dL (ref 13.0–17.0)
Hemoglobin: 13.3 g/dL (ref 13.0–17.0)
MCH: 32.1 pg (ref 26.0–34.0)
MCH: 30.9 pg (ref 26.0–34.0)
MCH: 30.5 pg (ref 26.0–34.0)
MCHC: 32.6 g/dL (ref 30.0–36.0)
MCHC: 32.4 g/dL (ref 30.0–36.0)
MCHC: 31.9 g/dL (ref 30.0–36.0)
MCV: 98.4 fL (ref 80.0–100.0)
MCV: 95.3 fL (ref 80.0–100.0)
MCV: 95.6 fL (ref 80.0–100.0)
Platelets: 219 10*3/uL (ref 150–400)
Platelets: 196 10*3/uL (ref 150–400)
Platelets: 206 10*3/uL (ref 150–400)
RBC: 4.42 MIL/uL (ref 4.22–5.81)
RBC: 4.3 MIL/uL (ref 4.22–5.81)
RBC: 4.36 MIL/uL (ref 4.22–5.81)
RDW: 14.5 % (ref 11.5–15.5)
RDW: 14.5 % (ref 11.5–15.5)
RDW: 14.3 % (ref 11.5–15.5)
WBC: 14.6 10*3/uL — ABNORMAL HIGH (ref 4.0–10.5)
WBC: 13.5 10*3/uL — ABNORMAL HIGH (ref 4.0–10.5)
WBC: 14.9 10*3/uL — ABNORMAL HIGH (ref 4.0–10.5)
nRBC: 0 % (ref 0.0–0.2)
nRBC: 0 % (ref 0.0–0.2)
nRBC: 0 % (ref 0.0–0.2)

## 2023-02-23 LAB — LIPID PANEL
Cholesterol: 103 mg/dL (ref 0–200)
HDL: 22 mg/dL — ABNORMAL LOW (ref >40)
LDL Cholesterol: 59 mg/dL (ref 0–99)
Total CHOL/HDL Ratio: 4.7 RATIO
Triglycerides: 110 mg/dL (ref <150)
VLDL: 22 mg/dL (ref 0–40)

## 2023-02-23 MED ORDER — PANTOPRAZOLE INFUSION (NEW) - SIMPLE MED
8.0000 mg/h | INTRAVENOUS | Status: DC
  Administered 2023-02-23: 8 mg/h via INTRAVENOUS
  Filled 2023-02-23 (×2): qty 100

## 2023-02-23 MED ORDER — HEPARIN (PORCINE) 25000 UT/250ML-% IV SOLN
900.0000 [IU]/h | INTRAVENOUS | Status: DC
  Filled 2023-02-23: qty 250

## 2023-02-23 MED ORDER — PANTOPRAZOLE 80MG IVPB - SIMPLE MED
80.0000 mg | Freq: Once | INTRAVENOUS | Status: AC
  Administered 2023-02-23: 80 mg via INTRAVENOUS
  Filled 2023-02-23: qty 100

## 2023-02-23 MED ORDER — PANTOPRAZOLE SODIUM 40 MG IV SOLR: 40.0000 mg | Freq: Two times a day (BID) | INTRAVENOUS | Status: DC

## 2023-02-23 MED ORDER — ISOSORBIDE DINITRATE 10 MG PO TABS
10.0000 mg | ORAL_TABLET | Freq: Three times a day (TID) | ORAL | Status: DC
  Administered 2023-02-23 (×2): 10 mg via ORAL
  Filled 2023-02-23 (×3): qty 1

## 2023-02-23 MED ORDER — PANTOPRAZOLE SODIUM 40 MG PO TBEC: 40.0000 mg | DELAYED_RELEASE_TABLET | Freq: Two times a day (BID) | ORAL | 1 refills | Status: DC

## 2023-02-23 MED ORDER — ASPIRIN 325 MG PO TBEC: 325.0000 mg | DELAYED_RELEASE_TABLET | Freq: Every day | ORAL | 0 refills | Status: DC

## 2023-02-23 NOTE — Progress Notes (Addendum)
Hematemesis episode on 7/29, Hgb stable so far, on ASA 81mg , brilinta 90mg  BID, and heparin gtt, GI consult requested per Dr Eden Emms request, will follow recommendation, appreciate.

## 2023-02-23 NOTE — Progress Notes (Signed)
Subjective:  No chest pain Had hemoptysis over night feels better No dyspnea or abdominal pain   Objective:  Vitals:   02/23/23 0630 02/23/23 0645 02/23/23 0700 02/23/23 0750  BP: 136/76  128/62   Pulse: 78 90 75   Resp: (!) 22 (!) 24 (!) 27   Temp:    98 F (36.7 C)  TempSrc:    Oral  SpO2: 94% 92% (!) 89%   Weight:      Height:        Intake/Output from previous day:  Intake/Output Summary (Last 24 hours) at 02/23/2023 0854 Last data filed at 02/23/2023 0800 Gross per 24 hour  Intake 944.09 ml  Output 500 ml  Net 444.09 ml    Physical Exam: No distress sitting in chair Right radial cath site A Abdomen benign Venous stasis changes both LE with decreased pulses  Femoral bruits Lungs clear not coughing   Lab Results: Basic Metabolic Panel: Recent Labs    02/23/23 0306 02/23/23 0733  NA 134* 137  K 6.1* 5.5*  CL 103 105  CO2 23 22  GLUCOSE 105* 94  BUN 34* 43*  CREATININE 1.78* 1.76*  CALCIUM 7.7* 7.9*   Liver Function Tests: Recent Labs    02/22/23 2307 02/23/23 0733  AST 205* 242*  ALT 95* 108*  ALKPHOS 77 67  BILITOT 0.7 0.7  PROT 6.7 6.5  ALBUMIN 2.7* 2.5*   No results for input(s): "LIPASE", "AMYLASE" in the last 72 hours. CBC: Recent Labs    02/23/23 0306 02/23/23 0733  WBC 13.4* 14.6*  HGB 14.4 14.2  HCT 44.5 43.5  MCV 94.7 98.4  PLT 197 219     Imaging: CARDIAC CATHETERIZATION  Result Date: 02/23/2023   Prox LAD to Mid LAD lesion is 45% stenosed.   Ost Cx to Prox Cx lesion is 40% stenosed.   1st Mrg lesion is 40% stenosed.   Prox RCA to Mid RCA lesion is 100% stenosed within the stent.   Balloon angioplasty was performed using a BALL SAPPHIRE NC24 3.0X15.   Post intervention, there is a 40% residual stenosis.   LV end diastolic pressure is normal.   There is no aortic valve stenosis.   Recommend uninterrupted dual antiplatelet therapy with Aspirin 81mg  daily and Ticagrelor 90mg  twice daily for a minimum of 12 months (ACS-Class I  recommendation). Late presenting inferior MI.  Stent occlusion likely happened about 48 hours ago.  Due to patient's residual discomfort, we opened the stented segment with angioplasty and thrombectomy improving flow.  The patient was pain-free.  He did have some hemodynamic instability during the procedure.  He has responded well to low-dose dopamine.  Hopefully, this can be weaned off quickly.  Will continue anticoagulation to help with residual thrombus.  I suspect that with the extensive thrombus, the patient will not benefit from further stenting of the mid RCA.  Further imaging based on his symptoms.  He will be moved to the ICU.   ECHOCARDIOGRAM COMPLETE  Result Date: 02/22/2023    ECHOCARDIOGRAM REPORT   Patient Name:   ADYSON BUZEK Date of Exam: 02/22/2023 Medical Rec #:  [id number]        Height:       69.0 in Accession #:    [id number]       Weight:       164.0 lb Date of Birth:  1951/07/25        BSA:  1.899 m Patient Age:    72 years         BP:           151/82 mmHg Patient Gender: M                HR:           49 bpm. Exam Location:  Inpatient Procedure: 2D Echo, Cardiac Doppler, Color Doppler and Intracardiac            Opacification Agent Indications:     Myocardial Infact I21.9  History:         Patient has prior history of Echocardiogram examinations, most                  recent 07/08/2022. CHF, CAD and Previous Myocardial Infarction,                  Arrythmias:Atrial Fibrillation and Atrial Flutter; Risk                  Factors:Hypertension and Dyslipidemia.  Sonographer:     Lucendia Herrlich Referring Phys:  [id number] Wendall Stade Diagnosing Phys: Charlton Haws MD IMPRESSIONS  1. Global hypokinesis worse in septum apex and inferior basal wall. Left ventricular ejection fraction, by estimation, is 40 to 45%. The left ventricle has mildly decreased function. The left ventricle has no regional wall motion abnormalities. There is  mild left ventricular hypertrophy. Left ventricular  diastolic parameters were normal.  2. Right ventricular systolic function is mildly reduced. The right ventricular size is mildly enlarged.  3. Left atrial size was moderately dilated.  4. Right atrial size was mildly dilated.  5. The mitral valve is abnormal. Trivial mitral valve regurgitation. No evidence of mitral stenosis.  6. The aortic valve is tricuspid. There is mild calcification of the aortic valve. There is mild thickening of the aortic valve. Aortic valve regurgitation is not visualized. Aortic valve sclerosis is present, with no evidence of aortic valve stenosis.  7. Aortic dilatation noted. There is mild dilatation of the ascending aorta, measuring 38 mm.  8. The inferior vena cava is dilated in size with >50% respiratory variability, suggesting right atrial pressure of 8 mmHg. FINDINGS  Left Ventricle: Global hypokinesis worse in septum apex and inferior basal wall. Left ventricular ejection fraction, by estimation, is 40 to 45%. The left ventricle has mildly decreased function. The left ventricle has no regional wall motion abnormalities. Definity contrast agent was given IV to delineate the left ventricular endocardial borders. The left ventricular internal cavity size was normal in size. There is mild left ventricular hypertrophy. Left ventricular diastolic parameters were normal. Right Ventricle: The right ventricular size is mildly enlarged. No increase in right ventricular wall thickness. Right ventricular systolic function is mildly reduced. Left Atrium: Left atrial size was moderately dilated. Right Atrium: Right atrial size was mildly dilated. Pericardium: Trivial pericardial effusion is present. The pericardial effusion is posterior to the left ventricle. Mitral Valve: The mitral valve is abnormal. There is mild thickening of the mitral valve leaflet(s). There is mild calcification of the mitral valve leaflet(s). Trivial mitral valve regurgitation. No evidence of mitral valve stenosis.  Tricuspid Valve: The tricuspid valve is normal in structure. Tricuspid valve regurgitation is mild . No evidence of tricuspid stenosis. Aortic Valve: The aortic valve is tricuspid. There is mild calcification of the aortic valve. There is mild thickening of the aortic valve. Aortic valve regurgitation is not visualized. Aortic valve sclerosis is present, with  no evidence of aortic valve stenosis. Aortic valve peak gradient measures 7.1 mmHg. Pulmonic Valve: The pulmonic valve was normal in structure. Pulmonic valve regurgitation is not visualized. No evidence of pulmonic stenosis. Aorta: Aortic dilatation noted. There is mild dilatation of the ascending aorta, measuring 38 mm. Venous: The inferior vena cava is dilated in size with greater than 50% respiratory variability, suggesting right atrial pressure of 8 mmHg. IAS/Shunts: No atrial level shunt detected by color flow Doppler.  LEFT VENTRICLE PLAX 2D LVIDd:         5.10 cm   Diastology LVIDs:         4.10 cm   LV e' medial:    5.35 cm/s LV PW:         1.30 cm   LV E/e' medial:  14.9 LV IVS:        1.20 cm   LV e' lateral:   10.80 cm/s LVOT diam:     2.30 cm   LV E/e' lateral: 7.4 LV SV:         60 LV SV Index:   31 LVOT Area:     4.15 cm  RIGHT VENTRICLE            IVC RV S prime:     7.46 cm/s  IVC diam: 2.60 cm TAPSE (M-mode): 1.3 cm LEFT ATRIUM           Index        RIGHT ATRIUM           Index LA diam:      4.40 cm 2.32 cm/m   RA Area:     25.70 cm LA Vol (A2C): 76.8 ml 40.42 ml/m  RA Volume:   87.60 ml  46.13 ml/m LA Vol (A4C): 42.8 ml 22.54 ml/m  AORTIC VALVE AV Area (Vmax): 2.63 cm AV Vmax:        133.67 cm/s AV Peak Grad:   7.1 mmHg LVOT Vmax:      84.60 cm/s LVOT Vmean:     48.867 cm/s LVOT VTI:       0.144 m  AORTA Ao Root diam: 3.60 cm Ao Asc diam:  3.80 cm MITRAL VALVE               TRICUSPID VALVE MV Area (PHT): 4.31 cm    TR Peak grad:   21.0 mmHg MV Decel Time: 176 msec    TR Vmax:        229.00 cm/s MR Peak grad: 38.9 mmHg MR Vmax:       312.00 cm/s  SHUNTS MV E velocity: 79.90 cm/s  Systemic VTI:  0.14 m MV A velocity: 81.50 cm/s  Systemic Diam: 2.30 cm MV E/A ratio:  0.98 Charlton Haws MD Electronically signed by Charlton Haws MD Signature Date/Time: 02/22/2023/12:29:15 PM    Final (Updated)    DG Chest 2 View  Result Date: 02/22/2023 CLINICAL DATA:  Shortness of breath. Mid chest pain. History of atrial fibrillation. EXAM: CHEST - 2 VIEW COMPARISON:  07/07/2022; 08/12/2012 FINDINGS: Grossly unchanged enlarged cardiac silhouette and mediastinal contours. The pulmonary vasculature appears less distinct than present examination with cephalization of flow. Potential developing airspace opacity within the peripheral aspect of the right upper lung. Interval development of a trace left-sided pleural effusion. No definite right-sided pleural effusion. No pneumothorax. No acute osseous abnormalities. Postcholecystectomy. IMPRESSION: 1. Constellation of findings suggestive of mild pulmonary edema with trace left-sided pleural effusion. 2. Potential developing airspace opacity within the peripheral aspect  of the right upper lung, potentially asymmetric alveolar pulmonary edema, though developing infection could have a similar appearance. A follow-up chest radiograph in 3 to 4 weeks after treatment is recommended to ensure resolution. Electronically Signed   By: Simonne Come M.D.   On: 02/22/2023 09:02    Cardiac Studies:  ECG: On admission residual ST elevation 3,F    Telemetry:  NSR   Echo: EF 40-45% mild RVE/hypokinesis trivial MR no effusion   Medications:    amLODipine  5 mg Oral Daily   aspirin  81 mg Oral Daily   Chlorhexidine Gluconate Cloth  6 each Topical Daily   metoprolol tartrate  50 mg Oral BID   pantoprazole  40 mg Oral BID   rosuvastatin  40 mg Oral Daily   sodium chloride flush  3 mL Intravenous Q12H   sodium chloride flush  3 mL Intravenous Q12H   ticagrelor  90 mg Oral BID      sodium chloride     sodium chloride      DOPamine Stopped (02/23/23 0703)   heparin     nitroGLYCERIN Stopped (02/22/23 1826)    Assessment/Plan:   STEMI:  late presentation Likely occurred Saturday He had a large area of prox/mid/distal RCA stenting that had 50% diffuse stenosis by cath 06/2022 but likely ruptured plaque with total occlusion Also likely bit of RV infarct and EF 40-45% Continue lopressor. D/C norvasc and start isordil Resume heparin 24-48 hours with large clot burden and DAT  Rhythm is stable and dopamine is off  Hemoptysis:  isolated Hct stable He has had chronic cough since Covid likely some bronchitis  HLD:  continue crestor   Ambulate ok to resume diet Possible d/c late tomorrow if no issues with anticoagulation   Charlton Haws 02/23/2023, 8:54 AM

## 2023-02-23 NOTE — Progress Notes (Addendum)
Nurse called reporting patient had 3 bouts of bloody bowel movement. Patient examined in the room, alert and oriented x3, asking to be discharged. He states he had 3 rounds of black stool this morning. He denied any chest pain, SOB, dizziness. His BP and HR are stable. Heparin gtt has not been resumed today yet due to bloody BM per nurse. LHC from 7/29 Balloon angioplasty was done for 100% ISR prox-mid RCA. He was suspected having extensive thrombus. He was placed on DAPT ASA and ticagrelor for 12 month and 24-48 hours heparin gtt today.   Melena  Will move the patient to bed, close monitor VS  NPO except meds  Check STAT CBC Start IV protonix gtt  Hold off resume heparin gtt, continue on ASA + Brilinta, discussed with Dr Eden Emms  GI consult re-called to Dr Elnoria Howard , who will see the patient   Follow up:   CBC showed Hgb 13.4 from 16.1; will trend CBC in 4 hours; continue monitor bleeding; pending GI.

## 2023-02-23 NOTE — Progress Notes (Signed)
Pt agreeable to amb but wife unsure if pt should be moved d/t verbal orders for him to return to bed.   Pt was educated on PTCA, Antiplatelet and ASA use, wt restrictions, no baths/daily wash-ups, s/s of infection, ex guidelines, s/s to stop exercising, NTG use and calling 911, heart healthy diet, risk factors and CRPII. Pt received MI book and materials on exercise, diet, and CRPII. Will refer to [facility].   Pt undecided about CR. Will refer to meet guidelines.   Faustino Congress 02/23/2023 1:49 PM

## 2023-02-23 NOTE — Discharge Instructions (Addendum)
Please take aspirin 325 mg once daily, first dose 02/24/2023 in the morning.  This medication is intended to help keep your heart arteries open and prevent heart attack in the future.  I have prescribed a new medication called Protonix (otherwise known as pantoprazole) which you will take 40 mg twice daily, once in the morning and once in the evening.  Your first dose will be on 02/24/2023 in the morning.  This medication is to help limit, or ideally prevent, further gastrointestinal bleeding.  I have sent these medications to your pharmacy and they should be available for pickup soon.  Please pick them tomorrow morning at the latest.  Please do not continue any of your other previous home medications at this time.  We will call you in the morning to see how you are doing.  We will also schedule follow-up for you in our clinic for reassessment and to determine if any new medication changes need to be made.  Please come immediately to the emergency room or call 911 if you develop any new or worsening chest pain, shortness of breath, disorientation, blood in your vomit, blood in your stool, dizziness/lightheadedness, if you pass out.  If you have any other new alarming symptoms, I also recommend that you come to the nearest emergency room for further evaluation immediately or call 911 if you feel it is urgent.

## 2023-02-23 NOTE — Plan of Care (Signed)
  Problem: Education: Goal: Understanding of CV disease, CV risk reduction, and recovery process will improve Outcome: Progressing   Problem: Cardiovascular: Goal: Ability to achieve and maintain adequate cardiovascular perfusion will improve Outcome: Progressing Goal: Vascular access site(s) Level 0-1 will be maintained Outcome: Progressing   Problem: Education: Goal: Knowledge of General Education information will improve Description: Including pain rating scale, medication(s)/side effects and non-pharmacologic comfort measures Outcome: Progressing   Problem: Safety: Goal: Ability to remain free from injury will improve Outcome: Progressing

## 2023-02-23 NOTE — Progress Notes (Addendum)
Patient and spouse spoke with GI doctor about possible EGD tomorrow. Patient refusing to sign consent form for this RN at this time, denies questions about procedure.   Verbal order from cardiology NP for bedrest due to possible GI bleed and bloody bowel movements. Patient refusing bedrest orders. Education provided. Will notify NP.

## 2023-02-23 NOTE — Progress Notes (Signed)
ANTICOAGULATION CONSULT NOTE  Pharmacy Consult for heparin Indication: chest pain/ACS  No Known Allergies  Patient Measurements: Height: 5\' 9"  (175.3 cm) Weight: 79 kg (174 lb 2.6 oz) IBW/kg (Calculated) : 70.7 Heparin Dosing Weight: 74.4kg   Vital Signs: Temp: 98 F (36.7 C) (07/30 0750) Temp Source: Oral (07/30 0750) BP: 128/62 (07/30 0700) Pulse Rate: 75 (07/30 0700)  Labs: Recent Labs    02/22/23 0842 02/22/23 1150 02/22/23 1322 02/22/23 2307 02/23/23 0306 02/23/23 0733  HGB 17.1*  --   --  16.1 14.4 14.2  HCT 54.1*  --   --  50.5 44.5 43.5  PLT 197  --   --  199 197 219  LABPROT  --   --   --  14.8  --   --   INR  --   --   --  1.1  --   --   CREATININE 1.84*  --    < > 2.07* 1.78* 1.76*  TROPONINIHS 5,714* 8,739*  --   --   --   --    < > = values in this interval not displayed.    Estimated Creatinine Clearance: 38.5 mL/min (A) (by C-G formula based on SCr of 1.76 mg/dL (H)).   Medical History: Past Medical History:  Diagnosis Date   Coronary artery disease    Hyperlipidemia    Hypertension    Myocardial infarction [facility])    PVD (peripheral vascular disease) (HCC)    S/P angioplasty with stent to Lt. iliac 08/15/12 08/15/2012    Medications:  Infusions:   sodium chloride     sodium chloride     DOPamine Stopped (02/23/23 0703)   heparin     nitroGLYCERIN Stopped (02/22/23 1826)    Assessment: 71 yom presented to the ED with CP. Pt was previously on oral anticoagulation but had been stopped.  Underwent cardiac cath 7/29 finding likely late presenting inferior MI and underwent angioplasty/thrombectomy - planned to continue anticoagulation for residual thrombus, but patient had episode of hematemesis in the evening and heparin was stopped.  Hematemesis improved, pharmacy asked to resume IV heparin this morning.  No overt bleeding or other complications noted.  CBC stable.  Goal of Therapy:  Heparin level 0.3-0.5 units/ml Monitor platelets by  anticoagulation protocol: Yes   Plan:  Restart heparin infusion at 900 units/hr. Check an 8 hr heparin level from restart Daily heparin level and CBC, s/sx of bleeding  Reece Leader, Colon Flattery, [facility] Clinical Pharmacist  02/23/2023 9:07 AM   [facility] pharmacy phone numbers are listed on [email or web address]

## 2023-02-23 NOTE — Consult Note (Signed)
Reason for Consult: Hematemesis Referring Physician: Cardiology  Jeffrey Norton HPI: This is a 72 year old male with a PMH of CAD s/p stenting of the RCA >20 years ago, PVD s/p stenting, HTN, and PAF admitted with complaints of chest pain.  The patient was evaluated in the ER and he was identified to have an elevated troponin at 5500.  His last cardiac catherization was with Dr. [name] 07/08/2022 and identified to have a 50% diffuse restenosis in the RCA.  During this admission he was diagnosed with a late presentation of a STEMI and he had balloon angioplasty of the RCA.  Dual antiplatelet therapy was initiated and he was well until today.  He was reported to have three episodes of melenic stools this morning and a bout of hematemesis.  Initially it was reported as hemoptysis as he has a chronic cough from a COVID infection, but the patient only reported hematemesis.  He does not have a history of GERD or any abdominal pain complaints.  His only evaluation was with Dr. Loreta Ave in 2015 for a diverticulitis and he was supposed to have a colonoscopy, but he did not undergo the procedure.  In December this year he was treated with Eliquis for his PAF, but he stopped using the medication as he had hematochezia.  The patient did not seek further evaluation or treatment for his hematochezia.  Past Medical History:  Diagnosis Date   Coronary artery disease    Hyperlipidemia    Hypertension    Myocardial infarction [facility])    PVD (peripheral vascular disease) (HCC)    S/P angioplasty with stent to Lt. iliac 08/15/12 08/15/2012    Past Surgical History:  Procedure Laterality Date   2D Echocardiogram  08/25/2004   EF >55%, LA-moderately dilated.   CARDIAC CATHETERIZATION  03/11/2002   RCA subtotally occluded with TIMI-1 to1/2 flow stented with a 3x23 Cypher DES resulting in reduction of occlusion to 0% with TIMI-3 flow   CORONARY BALLOON ANGIOPLASTY N/A 02/22/2023   Procedure: CORONARY BALLOON ANGIOPLASTY;   Surgeon: Corky Crafts, MD;  Location: [facility] INVASIVE CV LAB;  Service: Cardiovascular;  Laterality: N/A;   CORONARY THROMBECTOMY N/A 02/22/2023   Procedure: Coronary Thrombectomy;  Surgeon: Corky Crafts, MD;  Location: [facility] INVASIVE CV LAB;  Service: Cardiovascular;  Laterality: N/A;   LEA Doppler  08/29/2012   L SFA Proximal: 70-99% diameter reduction. Velocities suggest upper end of range-new finding when compared to previous study. L EIA stent: opne and patent without evidence of restenosis   LEFT HEART CATH AND CORONARY ANGIOGRAPHY N/A 07/07/2022   Procedure: LEFT HEART CATH AND CORONARY ANGIOGRAPHY;  Surgeon: [name], Peter M, MD;  Location: [facility] INVASIVE CV LAB;  Service: Cardiovascular;  Laterality: N/A;   LEFT HEART CATH AND CORONARY ANGIOGRAPHY N/A 02/22/2023   Procedure: LEFT HEART CATH AND CORONARY ANGIOGRAPHY;  Surgeon: Corky Crafts, MD;  Location: [facility] INVASIVE CV LAB;  Service: Cardiovascular;  Laterality: N/A;   Lexiscan Myoview  07/19/2012   No significant ECG changes, mild inferobasal hypokinesis with normal LV function, low risk stress nuclear study   PV Intervention  08/15/2012   Occluded L External Iliac-stented with a 10x63mm Abbott Nitinol Absolute Pro self-expanding stent. 75% stenosis in L Common Femoral artery stented witha 8x38mm lon Absolute Nitinol Absolute Po self-expanding stent.    Family History  Problem Relation Age of Onset   Diabetes Mother    Heart disease Mother 22   Hyperlipidemia Mother    Kidney disease  Mother    Heart disease Father 67   Cancer Father 39   Stroke Maternal Grandfather        Mid 64s   Lung disease Paternal Grandfather        Black Lung Disease    Social History:  reports that he quit smoking about 24 years ago. His smoking use included cigarettes. He has never used smokeless tobacco. He reports that he does not drink alcohol and does not use drugs.  Allergies: No Known Allergies  Medications: Scheduled:  aspirin  81  mg Oral Daily   Chlorhexidine Gluconate Cloth  6 each Topical Daily   isosorbide dinitrate  10 mg Oral TID   metoprolol tartrate  50 mg Oral BID   [START ON 02/27/2023] pantoprazole  40 mg Intravenous Q12H   rosuvastatin  40 mg Oral Daily   sodium chloride flush  3 mL Intravenous Q12H   sodium chloride flush  3 mL Intravenous Q12H   ticagrelor  90 mg Oral BID   Continuous:  sodium chloride     sodium chloride     DOPamine Stopped (02/23/23 0703)   nitroGLYCERIN Stopped (02/22/23 1826)   pantoprazole Stopped (02/23/23 1558)    Results for orders placed or performed during the hospital encounter of 02/22/23 (from the past 24 hour(s))  POCT Activated clotting time     Status: None   Collection Time: 02/22/23  6:29 PM  Result Value Ref Range   Activated Clotting Time 996 seconds  POCT Activated clotting time     Status: None   Collection Time: 02/22/23  6:45 PM  Result Value Ref Range   Activated Clotting Time 336 seconds  MRSA Next Gen by PCR, Nasal     Status: None   Collection Time: 02/22/23  6:57 PM   Specimen: Nasal Mucosa; Nasal Swab  Result Value Ref Range   MRSA by PCR Next Gen NOT DETECTED NOT DETECTED  Type and screen [facility]     Status: None   Collection Time: 02/22/23 10:55 PM  Result Value Ref Range   ABO/RH(D) O POS    Antibody Screen NEG    Sample Expiration      02/25/2023,2359 Performed at [facility], [address], [place], [place] [address]   CBC     Status: Abnormal   Collection Time: 02/22/23 11:07 PM  Result Value Ref Range   WBC 19.0 (H) 4.0 - 10.5 K/uL   RBC 5.10 4.22 - 5.81 MIL/uL   Hemoglobin 16.1 13.0 - 17.0 g/dL   HCT 86.5 78.4 - 69.6 %   MCV 99.0 80.0 - 100.0 fL   MCH 31.6 26.0 - 34.0 pg   MCHC 31.9 30.0 - 36.0 g/dL   RDW 29.5 28.4 - 13.2 %   Platelets 199 150 - 400 K/uL   nRBC 0.0 0.0 - 0.2 %  Protime-INR     Status: None   Collection Time: 02/22/23 11:07 PM  Result Value Ref Range   Prothrombin Time 14.8  11.4 - 15.2 seconds   INR 1.1 0.8 - 1.2  Comprehensive metabolic panel     Status: Abnormal   Collection Time: 02/22/23 11:07 PM  Result Value Ref Range   Sodium 134 (L) 135 - 145 mmol/L   Potassium 6.1 (H) 3.5 - 5.1 mmol/L   Chloride 104 98 - 111 mmol/L   CO2 19 (L) 22 - 32 mmol/L   Glucose, Bld 139 (H) 70 - 99 mg/dL  BUN 29 (H) 8 - 23 mg/dL   Creatinine, Ser 2.20 (H) 0.61 - 1.24 mg/dL   Calcium 7.9 (L) 8.9 - 10.3 mg/dL   Total Protein 6.7 6.5 - 8.1 g/dL   Albumin 2.7 (L) 3.5 - 5.0 g/dL   AST 254 (H) 15 - 41 U/L   ALT 95 (H) 0 - 44 U/L   Alkaline Phosphatase 77 38 - 126 U/L   Total Bilirubin 0.7 0.3 - 1.2 mg/dL   GFR, Estimated 34 (L) >60 mL/min   Anion gap 11 5 - 15  Basic metabolic panel     Status: Abnormal   Collection Time: 02/23/23  3:06 AM  Result Value Ref Range   Sodium 134 (L) 135 - 145 mmol/L   Potassium 6.1 (H) 3.5 - 5.1 mmol/L   Chloride 103 98 - 111 mmol/L   CO2 23 22 - 32 mmol/L   Glucose, Bld 105 (H) 70 - 99 mg/dL   BUN 34 (H) 8 - 23 mg/dL   Creatinine, Ser 2.70 (H) 0.61 - 1.24 mg/dL   Calcium 7.7 (L) 8.9 - 10.3 mg/dL   GFR, Estimated 40 (L) >60 mL/min   Anion gap 8 5 - 15  CBC     Status: Abnormal   Collection Time: 02/23/23  3:06 AM  Result Value Ref Range   WBC 13.4 (H) 4.0 - 10.5 K/uL   RBC 4.70 4.22 - 5.81 MIL/uL   Hemoglobin 14.4 13.0 - 17.0 g/dL   HCT 62.3 76.2 - 83.1 %   MCV 94.7 80.0 - 100.0 fL   MCH 30.6 26.0 - 34.0 pg   MCHC 32.4 30.0 - 36.0 g/dL   RDW 51.7 61.6 - 07.3 %   Platelets 197 150 - 400 K/uL   nRBC 0.0 0.0 - 0.2 %  CBC     Status: Abnormal   Collection Time: 02/23/23  7:33 AM  Result Value Ref Range   WBC 14.6 (H) 4.0 - 10.5 K/uL   RBC 4.42 4.22 - 5.81 MIL/uL   Hemoglobin 14.2 13.0 - 17.0 g/dL   HCT 71.0 62.6 - 94.8 %   MCV 98.4 80.0 - 100.0 fL   MCH 32.1 26.0 - 34.0 pg   MCHC 32.6 30.0 - 36.0 g/dL   RDW 54.6 27.0 - 35.0 %   Platelets 219 150 - 400 K/uL   nRBC 0.0 0.0 - 0.2 %  Comprehensive metabolic panel     Status:  Abnormal   Collection Time: 02/23/23  7:33 AM  Result Value Ref Range   Sodium 137 135 - 145 mmol/L   Potassium 5.5 (H) 3.5 - 5.1 mmol/L   Chloride 105 98 - 111 mmol/L   CO2 22 22 - 32 mmol/L   Glucose, Bld 94 70 - 99 mg/dL   BUN 43 (H) 8 - 23 mg/dL   Creatinine, Ser 0.93 (H) 0.61 - 1.24 mg/dL   Calcium 7.9 (L) 8.9 - 10.3 mg/dL   Total Protein 6.5 6.5 - 8.1 g/dL   Albumin 2.5 (L) 3.5 - 5.0 g/dL   AST 818 (H) 15 - 41 U/L   ALT 108 (H) 0 - 44 U/L   Alkaline Phosphatase 67 38 - 126 U/L   Total Bilirubin 0.7 0.3 - 1.2 mg/dL   GFR, Estimated 41 (L) >60 mL/min   Anion gap 10 5 - 15  CBC     Status: Abnormal   Collection Time: 02/23/23  1:53 PM  Result Value Ref Range   WBC 13.5 (  H) 4.0 - 10.5 K/uL   RBC 4.30 4.22 - 5.81 MIL/uL   Hemoglobin 13.3 13.0 - 17.0 g/dL   HCT 16.1 09.6 - 04.5 %   MCV 95.3 80.0 - 100.0 fL   MCH 30.9 26.0 - 34.0 pg   MCHC 32.4 30.0 - 36.0 g/dL   RDW 40.9 81.1 - 91.4 %   Platelets 196 150 - 400 K/uL   nRBC 0.0 0.0 - 0.2 %  Lipid panel     Status: Abnormal   Collection Time: 02/23/23  1:53 PM  Result Value Ref Range   Cholesterol 103 0 - 200 mg/dL   Triglycerides 782 <956 mg/dL   HDL 22 (L) >21 mg/dL   Total CHOL/HDL Ratio 4.7 RATIO   VLDL 22 0 - 40 mg/dL   LDL Cholesterol 59 0 - 99 mg/dL  CBC     Status: Abnormal   Collection Time: 02/23/23  3:55 PM  Result Value Ref Range   WBC 14.9 (H) 4.0 - 10.5 K/uL   RBC 4.36 4.22 - 5.81 MIL/uL   Hemoglobin 13.3 13.0 - 17.0 g/dL   HCT 30.8 65.7 - 84.6 %   MCV 95.6 80.0 - 100.0 fL   MCH 30.5 26.0 - 34.0 pg   MCHC 31.9 30.0 - 36.0 g/dL   RDW 96.2 95.2 - 84.1 %   Platelets 206 150 - 400 K/uL   nRBC 0.0 0.0 - 0.2 %     CARDIAC CATHETERIZATION  Result Date: 02/23/2023   Prox LAD to Mid LAD lesion is 45% stenosed.   Ost Cx to Prox Cx lesion is 40% stenosed.   1st Mrg lesion is 40% stenosed.   Prox RCA to Mid RCA lesion is 100% stenosed within the stent.   Balloon angioplasty was performed using a BALL SAPPHIRE  NC24 3.0X15.   Post intervention, there is a 40% residual stenosis.   LV end diastolic pressure is normal.   There is no aortic valve stenosis.   Recommend uninterrupted dual antiplatelet therapy with Aspirin 81mg  daily and Ticagrelor 90mg  twice daily for a minimum of 12 months (ACS-Class I recommendation). Late presenting inferior MI.  Stent occlusion likely happened about 48 hours ago.  Due to patient's residual discomfort, we opened the stented segment with angioplasty and thrombectomy improving flow.  The patient was pain-free.  He did have some hemodynamic instability during the procedure.  He has responded well to low-dose dopamine.  Hopefully, this can be weaned off quickly.  Will continue anticoagulation to help with residual thrombus.  I suspect that with the extensive thrombus, the patient will not benefit from further stenting of the mid RCA.  Further imaging based on his symptoms.  He will be moved to the ICU.   ECHOCARDIOGRAM COMPLETE  Result Date: 02/22/2023    ECHOCARDIOGRAM REPORT   Patient Name:   Jeffrey Norton Date of Exam: 02/22/2023 Medical Rec #:  [id number]        Height:       69.0 in Accession #:    [id number]       Weight:       164.0 lb Date of Birth:  09-01-1950        BSA:          1.899 m Patient Age:    71 years         BP:           151/82 mmHg Patient Gender: M  HR:           49 bpm. Exam Location:  Inpatient Procedure: 2D Echo, Cardiac Doppler, Color Doppler and Intracardiac            Opacification Agent Indications:     Myocardial Infact I21.9  History:         Patient has prior history of Echocardiogram examinations, most                  recent 07/08/2022. CHF, CAD and Previous Myocardial Infarction,                  Arrythmias:Atrial Fibrillation and Atrial Flutter; Risk                  Factors:Hypertension and Dyslipidemia.  Sonographer:     Lucendia Herrlich Referring Phys:  [id number] Diagnosing Phys: Charlton Haws MD IMPRESSIONS  1. Global  hypokinesis worse in septum apex and inferior basal wall. Left ventricular ejection fraction, by estimation, is 40 to 45%. The left ventricle has mildly decreased function. The left ventricle has no regional wall motion abnormalities. There is  mild left ventricular hypertrophy. Left ventricular diastolic parameters were normal.  2. Right ventricular systolic function is mildly reduced. The right ventricular size is mildly enlarged.  3. Left atrial size was moderately dilated.  4. Right atrial size was mildly dilated.  5. The mitral valve is abnormal. Trivial mitral valve regurgitation. No evidence of mitral stenosis.  6. The aortic valve is tricuspid. There is mild calcification of the aortic valve. There is mild thickening of the aortic valve. Aortic valve regurgitation is not visualized. Aortic valve sclerosis is present, with no evidence of aortic valve stenosis.  7. Aortic dilatation noted. There is mild dilatation of the ascending aorta, measuring 38 mm.  8. The inferior vena cava is dilated in size with >50% respiratory variability, suggesting right atrial pressure of 8 mmHg. FINDINGS  Left Ventricle: Global hypokinesis worse in septum apex and inferior basal wall. Left ventricular ejection fraction, by estimation, is 40 to 45%. The left ventricle has mildly decreased function. The left ventricle has no regional wall motion abnormalities. Definity contrast agent was given IV to delineate the left ventricular endocardial borders. The left ventricular internal cavity size was normal in size. There is mild left ventricular hypertrophy. Left ventricular diastolic parameters were normal. Right Ventricle: The right ventricular size is mildly enlarged. No increase in right ventricular wall thickness. Right ventricular systolic function is mildly reduced. Left Atrium: Left atrial size was moderately dilated. Right Atrium: Right atrial size was mildly dilated. Pericardium: Trivial pericardial effusion is present. The  pericardial effusion is posterior to the left ventricle. Mitral Valve: The mitral valve is abnormal. There is mild thickening of the mitral valve leaflet(s). There is mild calcification of the mitral valve leaflet(s). Trivial mitral valve regurgitation. No evidence of mitral valve stenosis. Tricuspid Valve: The tricuspid valve is normal in structure. Tricuspid valve regurgitation is mild . No evidence of tricuspid stenosis. Aortic Valve: The aortic valve is tricuspid. There is mild calcification of the aortic valve. There is mild thickening of the aortic valve. Aortic valve regurgitation is not visualized. Aortic valve sclerosis is present, with no evidence of aortic valve stenosis. Aortic valve peak gradient measures 7.1 mmHg. Pulmonic Valve: The pulmonic valve was normal in structure. Pulmonic valve regurgitation is not visualized. No evidence of pulmonic stenosis. Aorta: Aortic dilatation noted. There is mild dilatation of the ascending aorta, measuring 38 mm.  Venous: The inferior vena cava is dilated in size with greater than 50% respiratory variability, suggesting right atrial pressure of 8 mmHg. IAS/Shunts: No atrial level shunt detected by color flow Doppler.  LEFT VENTRICLE PLAX 2D LVIDd:         5.10 cm   Diastology LVIDs:         4.10 cm   LV e' medial:    5.35 cm/s LV PW:         1.30 cm   LV E/e' medial:  14.9 LV IVS:        1.20 cm   LV e' lateral:   10.80 cm/s LVOT diam:     2.30 cm   LV E/e' lateral: 7.4 LV SV:         60 LV SV Index:   31 LVOT Area:     4.15 cm  RIGHT VENTRICLE            IVC RV S prime:     7.46 cm/s  IVC diam: 2.60 cm TAPSE (M-mode): 1.3 cm LEFT ATRIUM           Index        RIGHT ATRIUM           Index LA diam:      4.40 cm 2.32 cm/m   RA Area:     25.70 cm LA Vol (A2C): 76.8 ml 40.42 ml/m  RA Volume:   87.60 ml  46.13 ml/m LA Vol (A4C): 42.8 ml 22.54 ml/m  AORTIC VALVE AV Area (Vmax): 2.63 cm AV Vmax:        133.67 cm/s AV Peak Grad:   7.1 mmHg LVOT Vmax:      84.60 cm/s  LVOT Vmean:     48.867 cm/s LVOT VTI:       0.144 m  AORTA Ao Root diam: 3.60 cm Ao Asc diam:  3.80 cm MITRAL VALVE               TRICUSPID VALVE MV Area (PHT): 4.31 cm    TR Peak grad:   21.0 mmHg MV Decel Time: 176 msec    TR Vmax:        229.00 cm/s MR Peak grad: 38.9 mmHg MR Vmax:      312.00 cm/s  SHUNTS MV E velocity: 79.90 cm/s  Systemic VTI:  0.14 m MV A velocity: 81.50 cm/s  Systemic Diam: 2.30 cm MV E/A ratio:  0.98 Charlton Haws MD Electronically signed by Charlton Haws MD Signature Date/Time: 02/22/2023/12:29:15 PM    Final (Updated)    DG Chest 2 View  Result Date: 02/22/2023 CLINICAL DATA:  Shortness of breath. Mid chest pain. History of atrial fibrillation. EXAM: CHEST - 2 VIEW COMPARISON:  07/07/2022; 08/12/2012 FINDINGS: Grossly unchanged enlarged cardiac silhouette and mediastinal contours. The pulmonary vasculature appears less distinct than present examination with cephalization of flow. Potential developing airspace opacity within the peripheral aspect of the right upper lung. Interval development of a trace left-sided pleural effusion. No definite right-sided pleural effusion. No pneumothorax. No acute osseous abnormalities. Postcholecystectomy. IMPRESSION: 1. Constellation of findings suggestive of mild pulmonary edema with trace left-sided pleural effusion. 2. Potential developing airspace opacity within the peripheral aspect of the right upper lung, potentially asymmetric alveolar pulmonary edema, though developing infection could have a similar appearance. A follow-up chest radiograph in 3 to 4 weeks after treatment is recommended to ensure resolution. Electronically Signed   By: Simonne Come M.D.   On: 02/22/2023 09:02  ROS:  As stated above in the HPI otherwise negative.  Blood pressure (!) 114/59, pulse 75, temperature 98.4 F (36.9 C), temperature source Oral, resp. rate (!) 26, height 5\' 9"  (1.753 m), weight 79 kg, SpO2 96%.    PE: Gen: NAD, Alert and Oriented HEENT:   [place]/AT, EOMI Neck: Supple, no LAD Lungs: CTA Bilaterally CV: RRR without M/G/R ABD: Soft, NTND, +BS Ext: No C/C/E  Assessment/Plan: 1) Heamatemesis. 2) Hematochezia in December 2023. 3) STEMI on dual antiplatelet therapy.   The patient's HGB on admission was at 16 g/dL and over the course of his hospitalization his HGB dropped down to 13.3 g/dL.  He does not report any issues with chest pain after passing the melenic stools or having hematemesis.  Further evaluation with an EGD is warranted.  Currently he is hemodynamically stable.  Because of his recent MI, a colonoscopy cannot be performed for at least 6 months.    Plan: 1) PPI BID. 2) Follow HGB and transfuse as necessary. 3) EGD tomorrow AM with conscious sedation.  Anesthesia is not available for this case secondary to the shortage of CRNAs.  , D 02/23/2023, 4:54 PM

## 2023-02-23 NOTE — Significant Event (Signed)
I was alerted by Mr. Gunion primary nurse, Levester Fresh, RN, around 815pm that patient wanted to leave the hospital immediately.  I reviewed his chart.  Fully, he presented with a late presenting inferior MI, likely occurring 48 hours prior to presentation.  He underwent coronary angiogram on 02/23/2023 with angioplasty and thrombectomy of the 100% RCA stenosis that was discovered.  There was noted to be hemodynamic instability during the procedure which responded well to low-dose dopamine.  This was weaned off later in the day, today 7/30.  Given his extensive thrombus, he was continued on anticoagulation and ultimately transition to DAPT with aspirin 81 mg daily and ticagrelor 90 mg twice daily.  Prior to this presentation, he was taking aspirin monotherapy 81 mg every other day.  He was unable to tolerate anticoagulation in the outpatient setting, which was prescribed for atrial fibrillation, due to gastrointestinal bleeding.  While on aspirin, Brilinta and heparin drip this hospitalization, he experienced hematemesis, starting on 7/29.  Also experienced melena x 3.  GI was consulted who recommended EGD this hospitalization which the patient has declined signing consent for.  He also received IV PPI therapy for his GI bleeding during this hospitalization.  I had a long discussion with Mr. Bonomo, his wife and his primary nurse at bedside.  I reviewed the above data and described in detail his concerning and extensive coronary artery disease in combination with his gastrointestinal bleeding.  I discussed with him that he is very likely still experiencing an ongoing GI bleed.  I discussed that he is high risk for an additional coronary ischemic event, worsening heart failure, life-threatening arrhythmias, and significant gastrointestinal bleeding all of which could lead to significant morbidity acutely or even death.  His wife expressed wishes for him to stay in the hospital.  Mr. Ovitt was very  adamant that he did not want to stay in the hospital for any second longer.  His main concern was his significant anxiety and inability to sleep in the hospital.  I discussed that I was willing to give him anxiety reducing medications, including benzodiazepines, to help with sleep and his anxiety in hopes that he would stay in the hospital for closer monitoring and continued therapy.  He was resistant to these options.  He stated on multiple occasions that he understands the risks of leaving the hospital at this time including all of the above as well as those that are unforeseeable and may be related to his recent heart attack and/or ongoing gastrointestinal bleeding.  I also discussed with him that recommending medications for treatment at this time of his heart attack are difficult as we do not have all of the information that we would want and reasonably be able to obtain this hospitalization in order to come up with the best regimen for discharge.  In particular, he needs antiplatelet therapy given the recent coronary intervention and significant coronary artery disease resulting myocardial infarction that he presented with.  Given his gastrointestinal bleeding and high risk of ongoing gastrointestinal bleeding on antiplatelet medication, he will be discharged on aspirin 325 mg once daily and no P2Y12 inhibition.  Similarly, he will not be discharged on anticoagulation.  I explained my rationale for this decision.  I also prescribed pantoprazole 40 mg twice daily for treatment of presumed acute upper GI bleed.  I instructed him to present immediately to medical care or call 911 should he develop new acute onset chest pain, shortness of breath, dizziness/lightheadedness, blood in his emesis, blood  in his stool, if he passes out or any new concerning symptom.  We will be happy to treat him here if he requires further care.  I also let him know that we will contact him in the morning and set up a follow-up  appointment for further treatment considerations.  Despite all of the above, the patient is adamant that he is going to leave AGAINST MEDICAL ADVICE and is willing to sign the appropriate paperwork.  He is able to explain the risks  of leaving AMA at this time and displays decision-making capacity.  I discussed all of the above with Dr. Izora Ribas, cardiology, who is in agreement with the plan.

## 2023-02-23 NOTE — Progress Notes (Signed)
Pt sleeping, will f/u.

## 2023-02-23 NOTE — Plan of Care (Signed)
  Problem: Education: Goal: Understanding of CV disease, CV risk reduction, and recovery process will improve Outcome: Not Met (add Reason) Goal: Individualized Educational Video(s) Outcome: Not Met (add Reason)   Problem: Activity: Goal: Ability to return to baseline activity level will improve Outcome: Not Met (add Reason)   Problem: Cardiovascular: Goal: Ability to achieve and maintain adequate cardiovascular perfusion will improve Outcome: Not Met (add Reason) Goal: Vascular access site(s) Level 0-1 will be maintained Outcome: Not Met (add Reason)   Problem: Health Behavior/Discharge Planning: Goal: Ability to safely manage health-related needs after discharge will improve Outcome: Not Met (add Reason)   Problem: Education: Goal: Understanding of cardiac disease, CV risk reduction, and recovery process will improve Outcome: Not Met (add Reason) Goal: Individualized Educational Video(s) Outcome: Not Met (add Reason)   Problem: Activity: Goal: Ability to tolerate increased activity will improve Outcome: Not Met (add Reason)   Problem: Cardiac: Goal: Ability to achieve and maintain adequate cardiovascular perfusion will improve Outcome: Not Met (add Reason)   Problem: Health Behavior/Discharge Planning: Goal: Ability to safely manage health-related needs after discharge will improve Outcome: Not Met (add Reason)   Problem: Education: Goal: Knowledge of General Education information will improve Description: Including pain rating scale, medication(s)/side effects and non-pharmacologic comfort measures Outcome: Not Met (add Reason)   Problem: Health Behavior/Discharge Planning: Goal: Ability to manage health-related needs will improve Outcome: Not Met (add Reason)   Problem: Clinical Measurements: Goal: Ability to maintain clinical measurements within normal limits will improve Outcome: Not Met (add Reason) Goal: Will remain free from infection Outcome: Not Met (add  Reason) Goal: Diagnostic test results will improve Outcome: Not Met (add Reason) Goal: Respiratory complications will improve Outcome: Not Met (add Reason) Goal: Cardiovascular complication will be avoided Outcome: Not Met (add Reason)   Problem: Activity: Goal: Risk for activity intolerance will decrease Outcome: Not Met (add Reason)   Problem: Nutrition: Goal: Adequate nutrition will be maintained Outcome: Not Met (add Reason)   Problem: Coping: Goal: Level of anxiety will decrease Outcome: Not Met (add Reason)   Problem: Elimination: Goal: Will not experience complications related to bowel motility Outcome: Not Met (add Reason) Goal: Will not experience complications related to urinary retention Outcome: Not Met (add Reason)   Problem: Pain Managment: Goal: General experience of comfort will improve Outcome: Not Met (add Reason)   Problem: Safety: Goal: Ability to remain free from injury will improve Outcome: Not Met (add Reason)   Problem: Skin Integrity: Goal: Risk for impaired skin integrity will decrease Outcome: Not Met (add Reason)   Patient is leaving AMA.

## 2023-02-23 NOTE — Plan of Care (Signed)
  Problem: Education: Goal: Understanding of CV disease, CV risk reduction, and recovery process will improve Outcome: Progressing Goal: Individualized Educational Video(s) Outcome: Progressing   Problem: Activity: Goal: Ability to return to baseline activity level will improve Outcome: Progressing   Problem: Cardiovascular: Goal: Ability to achieve and maintain adequate cardiovascular perfusion will improve Outcome: Progressing Goal: Vascular access site(s) Level 0-1 will be maintained Outcome: Progressing   Problem: Health Behavior/Discharge Planning: Goal: Ability to safely manage health-related needs after discharge will improve Outcome: Progressing   Problem: Education: Goal: Understanding of cardiac disease, CV risk reduction, and recovery process will improve Outcome: Progressing Goal: Individualized Educational Video(s) Outcome: Progressing   Problem: Activity: Goal: Ability to tolerate increased activity will improve Outcome: Progressing   Problem: Cardiac: Goal: Ability to achieve and maintain adequate cardiovascular perfusion will improve Outcome: Progressing   Problem: Health Behavior/Discharge Planning: Goal: Ability to safely manage health-related needs after discharge will improve Outcome: Progressing   Problem: Education: Goal: Knowledge of General Education information will improve Description: Including pain rating scale, medication(s)/side effects and non-pharmacologic comfort measures Outcome: Progressing   Problem: Health Behavior/Discharge Planning: Goal: Ability to manage health-related needs will improve Outcome: Progressing   Problem: Clinical Measurements: Goal: Ability to maintain clinical measurements within normal limits will improve Outcome: Progressing Goal: Will remain free from infection Outcome: Progressing Goal: Diagnostic test results will improve Outcome: Progressing Goal: Respiratory complications will improve Outcome:  Progressing Goal: Cardiovascular complication will be avoided Outcome: Progressing   Problem: Activity: Goal: Risk for activity intolerance will decrease Outcome: Progressing   Problem: Nutrition: Goal: Adequate nutrition will be maintained Outcome: Progressing   Problem: Coping: Goal: Level of anxiety will decrease Outcome: Progressing   Problem: Elimination: Goal: Will not experience complications related to bowel motility Outcome: Progressing Goal: Will not experience complications related to urinary retention Outcome: Progressing   Problem: Pain Managment: Goal: General experience of comfort will improve Outcome: Progressing   Problem: Safety: Goal: Ability to remain free from injury will improve Outcome: Progressing   Problem: Skin Integrity: Goal: Risk for impaired skin integrity will decrease Outcome: Progressing   

## 2023-02-24 ENCOUNTER — Other Ambulatory Visit (HOSPITAL_COMMUNITY): Payer: Self-pay

## 2023-02-24 SURGERY — EGD (ESOPHAGOGASTRODUODENOSCOPY)
Anesthesia: Moderate Sedation

## 2023-02-26 ENCOUNTER — Other Ambulatory Visit: Payer: Self-pay | Admitting: Cardiology

## 2023-03-04 ENCOUNTER — Emergency Department (HOSPITAL_COMMUNITY): Payer: Medicare Other

## 2023-03-04 ENCOUNTER — Inpatient Hospital Stay (HOSPITAL_COMMUNITY): Payer: Medicare Other

## 2023-03-04 ENCOUNTER — Other Ambulatory Visit: Payer: Self-pay

## 2023-03-04 ENCOUNTER — Encounter (HOSPITAL_COMMUNITY): Payer: Self-pay

## 2023-03-04 ENCOUNTER — Inpatient Hospital Stay (HOSPITAL_COMMUNITY)
Admission: EM | Admit: 2023-03-04 | Discharge: 2023-03-06 | DRG: 280 | Disposition: A | Discharge status: Home / Self Care | Payer: Medicare Other | Attending: Internal Medicine | Admitting: Internal Medicine

## 2023-03-04 DIAGNOSIS — D72829 Elevated white blood cell count, unspecified: Secondary | ICD-10-CM | POA: Diagnosis present

## 2023-03-04 DIAGNOSIS — R0789 Other chest pain: Secondary | ICD-10-CM | POA: Diagnosis not present

## 2023-03-04 DIAGNOSIS — I4891 Unspecified atrial fibrillation: Secondary | ICD-10-CM | POA: Diagnosis present

## 2023-03-04 DIAGNOSIS — K269 Duodenal ulcer, unspecified as acute or chronic, without hemorrhage or perforation: Secondary | ICD-10-CM | POA: Diagnosis present

## 2023-03-04 DIAGNOSIS — Z955 Presence of coronary angioplasty implant and graft: Secondary | ICD-10-CM

## 2023-03-04 DIAGNOSIS — J9601 Acute respiratory failure with hypoxia: Secondary | ICD-10-CM | POA: Diagnosis present

## 2023-03-04 DIAGNOSIS — Z91128 Patient's intentional underdosing of medication regimen for other reason: Secondary | ICD-10-CM

## 2023-03-04 DIAGNOSIS — I255 Ischemic cardiomyopathy: Secondary | ICD-10-CM | POA: Diagnosis present

## 2023-03-04 DIAGNOSIS — E871 Hypo-osmolality and hyponatremia: Secondary | ICD-10-CM | POA: Diagnosis present

## 2023-03-04 DIAGNOSIS — I222 Subsequent non-ST elevation (NSTEMI) myocardial infarction: Secondary | ICD-10-CM | POA: Diagnosis present

## 2023-03-04 DIAGNOSIS — K2971 Gastritis, unspecified, with bleeding: Secondary | ICD-10-CM | POA: Diagnosis present

## 2023-03-04 DIAGNOSIS — R079 Chest pain, unspecified: Secondary | ICD-10-CM | POA: Diagnosis not present

## 2023-03-04 DIAGNOSIS — I2119 ST elevation (STEMI) myocardial infarction involving other coronary artery of inferior wall: Secondary | ICD-10-CM | POA: Diagnosis present

## 2023-03-04 DIAGNOSIS — Z87891 Personal history of nicotine dependence: Secondary | ICD-10-CM

## 2023-03-04 DIAGNOSIS — R0902 Hypoxemia: Principal | ICD-10-CM

## 2023-03-04 DIAGNOSIS — I70202 Unspecified atherosclerosis of native arteries of extremities, left leg: Secondary | ICD-10-CM | POA: Diagnosis present

## 2023-03-04 DIAGNOSIS — T50916A Underdosing of multiple unspecified drugs, medicaments and biological substances, initial encounter: Secondary | ICD-10-CM | POA: Diagnosis present

## 2023-03-04 DIAGNOSIS — N179 Acute kidney failure, unspecified: Secondary | ICD-10-CM | POA: Diagnosis present

## 2023-03-04 DIAGNOSIS — N183 Chronic kidney disease, stage 3 unspecified: Secondary | ICD-10-CM | POA: Diagnosis present

## 2023-03-04 DIAGNOSIS — I252 Old myocardial infarction: Secondary | ICD-10-CM

## 2023-03-04 DIAGNOSIS — E785 Hyperlipidemia, unspecified: Secondary | ICD-10-CM | POA: Diagnosis present

## 2023-03-04 DIAGNOSIS — Z8616 Personal history of COVID-19: Secondary | ICD-10-CM | POA: Diagnosis not present

## 2023-03-04 DIAGNOSIS — I10 Essential (primary) hypertension: Secondary | ICD-10-CM | POA: Diagnosis present

## 2023-03-04 DIAGNOSIS — Z7982 Long term (current) use of aspirin: Secondary | ICD-10-CM

## 2023-03-04 DIAGNOSIS — Z823 Family history of stroke: Secondary | ICD-10-CM

## 2023-03-04 DIAGNOSIS — K209 Esophagitis, unspecified without bleeding: Secondary | ICD-10-CM | POA: Diagnosis present

## 2023-03-04 DIAGNOSIS — I130 Hypertensive heart and chronic kidney disease with heart failure and stage 1 through stage 4 chronic kidney disease, or unspecified chronic kidney disease: Secondary | ICD-10-CM | POA: Diagnosis not present

## 2023-03-04 DIAGNOSIS — I2510 Atherosclerotic heart disease of native coronary artery without angina pectoris: Secondary | ICD-10-CM | POA: Diagnosis present

## 2023-03-04 DIAGNOSIS — Z9861 Coronary angioplasty status: Secondary | ICD-10-CM

## 2023-03-04 DIAGNOSIS — R9431 Abnormal electrocardiogram [ECG] [EKG]: Secondary | ICD-10-CM | POA: Diagnosis not present

## 2023-03-04 DIAGNOSIS — R911 Solitary pulmonary nodule: Secondary | ICD-10-CM | POA: Diagnosis present

## 2023-03-04 DIAGNOSIS — Z95820 Peripheral vascular angioplasty status with implants and grafts: Secondary | ICD-10-CM

## 2023-03-04 DIAGNOSIS — I739 Peripheral vascular disease, unspecified: Secondary | ICD-10-CM

## 2023-03-04 DIAGNOSIS — Z79899 Other long term (current) drug therapy: Secondary | ICD-10-CM

## 2023-03-04 DIAGNOSIS — I3139 Other pericardial effusion (noninflammatory): Secondary | ICD-10-CM | POA: Diagnosis present

## 2023-03-04 DIAGNOSIS — R7989 Other specified abnormal findings of blood chemistry: Secondary | ICD-10-CM | POA: Diagnosis present

## 2023-03-04 DIAGNOSIS — I5021 Acute systolic (congestive) heart failure: Secondary | ICD-10-CM

## 2023-03-04 DIAGNOSIS — I708 Atherosclerosis of other arteries: Secondary | ICD-10-CM | POA: Diagnosis present

## 2023-03-04 DIAGNOSIS — I5023 Acute on chronic systolic (congestive) heart failure: Secondary | ICD-10-CM | POA: Insufficient documentation

## 2023-03-04 DIAGNOSIS — Z833 Family history of diabetes mellitus: Secondary | ICD-10-CM

## 2023-03-04 DIAGNOSIS — I509 Heart failure, unspecified: Secondary | ICD-10-CM | POA: Insufficient documentation

## 2023-03-04 DIAGNOSIS — Z841 Family history of disorders of kidney and ureter: Secondary | ICD-10-CM

## 2023-03-04 DIAGNOSIS — Z83438 Family history of other disorder of lipoprotein metabolism and other lipidemia: Secondary | ICD-10-CM

## 2023-03-04 DIAGNOSIS — D500 Iron deficiency anemia secondary to blood loss (chronic): Secondary | ICD-10-CM | POA: Diagnosis present

## 2023-03-04 DIAGNOSIS — Z8249 Family history of ischemic heart disease and other diseases of the circulatory system: Secondary | ICD-10-CM

## 2023-03-04 LAB — CBC WITH DIFFERENTIAL/PLATELET
Abs Immature Granulocytes: 0.07 10*3/uL (ref 0.00–0.07)
Basophils Absolute: 0.1 10*3/uL (ref 0.0–0.1)
Basophils Relative: 1 %
Eosinophils Absolute: 0.1 10*3/uL (ref 0.0–0.5)
Eosinophils Relative: 1 %
HCT: 39.2 % (ref 39.0–52.0)
Hemoglobin: 12.4 g/dL — ABNORMAL LOW (ref 13.0–17.0)
Immature Granulocytes: 1 %
Lymphocytes Relative: 8 %
Lymphs Abs: 1 10*3/uL (ref 0.7–4.0)
MCH: 30.5 pg (ref 26.0–34.0)
MCHC: 31.6 g/dL (ref 30.0–36.0)
MCV: 96.3 fL (ref 80.0–100.0)
Monocytes Absolute: 0.9 10*3/uL (ref 0.1–1.0)
Monocytes Relative: 7 %
Neutro Abs: 10.1 10*3/uL — ABNORMAL HIGH (ref 1.7–7.7)
Neutrophils Relative %: 82 %
Platelets: 361 10*3/uL (ref 150–400)
RBC: 4.07 MIL/uL — ABNORMAL LOW (ref 4.22–5.81)
RDW: 14.3 % (ref 11.5–15.5)
WBC: 12.2 10*3/uL — ABNORMAL HIGH (ref 4.0–10.5)
nRBC: 0 % (ref 0.0–0.2)

## 2023-03-04 LAB — BASIC METABOLIC PANEL
Anion gap: 8 (ref 5–15)
BUN: 16 mg/dL (ref 8–23)
CO2: 24 mmol/L (ref 22–32)
Calcium: 8.2 mg/dL — ABNORMAL LOW (ref 8.9–10.3)
Chloride: 101 mmol/L (ref 98–111)
Creatinine, Ser: 1.55 mg/dL — ABNORMAL HIGH (ref 0.61–1.24)
GFR, Estimated: 48 mL/min — ABNORMAL LOW (ref >60)
Glucose, Bld: 141 mg/dL — ABNORMAL HIGH (ref 70–99)
Potassium: 4.8 mmol/L (ref 3.5–5.1)
Sodium: 133 mmol/L — ABNORMAL LOW (ref 135–145)

## 2023-03-04 LAB — TROPONIN I (HIGH SENSITIVITY)
Troponin I (High Sensitivity): 3937 ng/L (ref <18)
Troponin I (High Sensitivity): 3947 ng/L (ref <18)

## 2023-03-04 LAB — D-DIMER, QUANTITATIVE: D-Dimer, Quant: 1.56 ug/mL-FEU — ABNORMAL HIGH (ref 0.00–0.50)

## 2023-03-04 LAB — BRAIN NATRIURETIC PEPTIDE: B Natriuretic Peptide: 395.6 pg/mL — ABNORMAL HIGH (ref 0.0–100.0)

## 2023-03-04 MED ORDER — ONDANSETRON HCL 4 MG/2ML IJ SOLN: 4.0000 mg | Freq: Four times a day (QID) | INTRAMUSCULAR | Status: DC | PRN

## 2023-03-04 MED ORDER — FUROSEMIDE 10 MG/ML IJ SOLN: 40.0000 mg | Freq: Two times a day (BID) | INTRAMUSCULAR | Status: DC

## 2023-03-04 MED ORDER — HEPARIN SODIUM (PORCINE) 5000 UNIT/ML IJ SOLN
5000.0000 [IU] | Freq: Three times a day (TID) | INTRAMUSCULAR | Status: DC
  Filled 2023-03-04 (×2): qty 1

## 2023-03-04 MED ORDER — SODIUM CHLORIDE 0.9 % IV SOLN: 250.0000 mL | INTRAVENOUS | Status: DC | PRN

## 2023-03-04 MED ORDER — SODIUM CHLORIDE 0.9% FLUSH
3.0000 mL | Freq: Two times a day (BID) | INTRAVENOUS | Status: DC
  Administered 2023-03-04 – 2023-03-06 (×4): 3 mL via INTRAVENOUS

## 2023-03-04 MED ORDER — SODIUM CHLORIDE 0.9% FLUSH: 3.0000 mL | INTRAVENOUS | Status: DC | PRN

## 2023-03-04 MED ORDER — LISINOPRIL 5 MG PO TABS
5.0000 mg | ORAL_TABLET | Freq: Every day | ORAL | Status: DC
  Administered 2023-03-04: 5 mg via ORAL
  Filled 2023-03-04: qty 1

## 2023-03-04 MED ORDER — ASPIRIN 81 MG PO TBEC
81.0000 mg | DELAYED_RELEASE_TABLET | Freq: Every day | ORAL | Status: DC
  Administered 2023-03-04 – 2023-03-06 (×3): 81 mg via ORAL
  Filled 2023-03-04 (×3): qty 1

## 2023-03-04 MED ORDER — ACETAMINOPHEN 325 MG PO TABS: 650.0000 mg | ORAL_TABLET | ORAL | Status: DC | PRN

## 2023-03-04 MED ORDER — FUROSEMIDE 10 MG/ML IJ SOLN
20.0000 mg | Freq: Two times a day (BID) | INTRAMUSCULAR | Status: AC
  Administered 2023-03-04 (×2): 20 mg via INTRAVENOUS
  Filled 2023-03-04 (×2): qty 2

## 2023-03-04 MED ORDER — FUROSEMIDE 10 MG/ML IJ SOLN
40.0000 mg | INTRAMUSCULAR | Status: AC
  Administered 2023-03-04: 40 mg via INTRAVENOUS
  Filled 2023-03-04: qty 4

## 2023-03-04 NOTE — Progress Notes (Signed)
Subjective: See below.  Objective: Vital signs in last 24 hours: Temp:  [98.1 F (36.7 C)-99.3 F (37.4 C)] 98.4 F (36.9 C) (08/08 1441) Pulse Rate:  [81-106] 91 (08/08 1441) Resp:  [18-32] 20 (08/08 1441) BP: (97-189)/(66-154) 136/72 (08/08 1441) SpO2:  [87 %-100 %] 92 % (08/08 1441) Weight:  [76.8 kg-84.8 kg] 76.8 kg (08/08 1441)    Intake/Output from previous day: No intake/output data recorded. Intake/Output this shift: Total I/O In: -  Out: 825 [Urine:825]  General appearance: fatigued GI: soft, non-tender; bowel sounds normal; no masses,  no organomegaly  Lab Results: Recent Labs    03/04/23 0300  WBC 12.2*  HGB 12.4*  HCT 39.2  PLT 361   BMET Recent Labs    03/04/23 0300  NA 133*  K 4.8  CL 101  CO2 24  GLUCOSE 141*  BUN 16  CREATININE 1.55*  CALCIUM 8.2*   LFT No results for input(s): "PROT", "ALBUMIN", "AST", "ALT", "ALKPHOS", "BILITOT", "BILIDIR", "IBILI" in the last 72 hours. PT/INR No results for input(s): "LABPROT", "INR" in the last 72 hours. Hepatitis Panel No results for input(s): "HEPBSAG", "HCVAB", "HEPAIGM", "HEPBIGM" in the last 72 hours. C-Diff No results for input(s): "CDIFFTOX" in the last 72 hours. Fecal Lactopherrin No results for input(s): "FECLLACTOFRN" in the last 72 hours.  Studies/Results: CT CHEST WO CONTRAST  Result Date: 03/04/2023 CLINICAL DATA:  Hypoxia, acute respiratory failure. Chest pain and pressure. EXAM: CT CHEST WITHOUT CONTRAST TECHNIQUE: Multidetector CT imaging of the chest was performed following the standard protocol without IV contrast. RADIATION DOSE REDUCTION: This exam was performed according to the departmental dose-optimization program which includes automated exposure control, adjustment of the mA and/or kV according to patient size and/or use of iterative reconstruction technique. COMPARISON:  No prior chest CT. Chest radiographs 03/04/2023 and 02/22/2023. Abdominal CTA 08/29/2012. FINDINGS:  Cardiovascular: Atherosclerosis of the aorta, great vessels and coronary arteries. There are calcifications of the aortic valve. New mild cardiomegaly and a new small pericardial effusion. Mediastinum/Nodes: There are numerous prominent mediastinal and hilar lymph nodes, including a 1.3 cm short axis right paratracheal node on image 55/3 and a right infrahilar node measuring 1.1 cm short axis on image 85/3. No axillary adenopathy. The thyroid gland, trachea and esophagus demonstrate no significant findings. Lungs/Pleura: No pleural effusion or pneumothorax. Moderate centrilobular and paraseptal emphysema with scattered subpleural reticulation, architectural distortion and central airway thickening. Asymmetric dependent airspace disease at the right lung base suspicious for pneumonia, possibly on the basis of aspiration. Cavitary subpleural nodule peripherally in the right upper lobe measuring 1.8 x 1.7 cm on image 54/4. This measures up to 2.1 cm on sagittal image 43/6. No other suspicious pulmonary nodules. Upper abdomen: No acute or suspicious findings are seen in the visualized upper abdomen. There is a stable right adrenal nodule measuring 2.5 cm and 8 HU consistent with an incidental lipid rich adrenal adenoma. No follow-up imaging recommended. Musculoskeletal/Chest wall: There is no chest wall mass or suspicious osseous finding. Unless specific follow-up recommendations are mentioned in the findings or impression sections, no imaging follow-up of any mentioned incidental findings is recommended. IMPRESSION: 1. Asymmetric dependent airspace disease at the right lung base suspicious for pneumonia, possibly on the basis of aspiration. 2. Cavitary subpleural nodule peripherally in the right upper lobe measuring up to 2.1 cm. This is nonspecific and could reflect a small abscess, although is suspicious for bronchogenic carcinoma. Short-term CT follow-up (2-3 months) suggested, and if persistent recommend further  evaluation with PET-CT.  3. Nonspecific mediastinal and hilar adenopathy, potentially reactive. 4. New mild cardiomegaly and a new small pericardial effusion. 5. 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 6. Stable incidental right adrenal adenoma. 7. Aortic Atherosclerosis (ICD10-I70.0) and Emphysema (ICD10-J43.9). Electronically Signed   By: Carey Bullocks M.D.   On: 03/04/2023 08:42   DG Chest Port 1 View  Result Date: 03/04/2023 CLINICAL DATA:  Chest pain. EXAM: PORTABLE CHEST 1 VIEW COMPARISON:  February 22, 2023 FINDINGS: The cardiac silhouette is moderately enlarged and stable in appearance. Mild to moderate severity diffuse, chronic appearing increased interstitial lung markings are seen. Mild areas of atelectasis and/or infiltrate are seen within the mid left lung and bilateral lung bases. No pleural effusion or pneumothorax is identified. The visualized skeletal structures are unremarkable. IMPRESSION: Chronic appearing increased interstitial lung markings with mild areas of atelectasis and/or infiltrate within the mid left lung and bilateral lung bases. Electronically Signed   By: Aram Candela M.D.   On: 03/04/2023 03:31    Medications: Scheduled:  aspirin EC  81 mg Oral Daily   furosemide  20 mg Intravenous BID   heparin  5,000 Units Subcutaneous Q8H   lisinopril  5 mg Oral Daily   sodium chloride flush  3 mL Intravenous Q12H   Continuous:  sodium chloride      Assessment/Plan: 1) STEMI. 2) Recent hematemesis. 3) Medical noncompliance.   I am very familiar with the patient.  He left AMA one week ago.  He represents with chest pain and an elevated troponin at 3947.  His HGB remains mildly decreased from last week at 12.4 g/dL.  He is agreeable to having an EGD.  Plan: 1) EGD tomorrow.  LOS: 0 days   , D 03/04/2023, 4:16 PM

## 2023-03-04 NOTE — ED Notes (Signed)
ED TO INPATIENT HANDOFF REPORT  ED Nurse Name and Phone #: Theophilus Bones [phone]  S Name/Age/Gender Jeffrey Norton 72 y.o. male Room/Bed: 037C/037C  Code Status   Code Status: Full Code  Home/SNF/Other Home Patient oriented to: self, place, time, and situation Is this baseline? Yes   Triage Complete: Triage complete  Chief Complaint Acute HFrEF (heart failure with reduced ejection fraction) (HCC) [I50.21]  Triage Note PER EMS:pt reports a gradual onset of central non-radiating chest pain/pressure that started about 5 hrs PTA (10pm). Hx of MI. He took 324 aspirin and received 0.4 nitroglycerin tabs x 2 and 6 mg Morphine IV by EMS. Initial room air sat was 91%. He was placed on 4L [place] and O2 increased to 96% on 4L [place].  BP-138/78, HR-95, cbg-133      Allergies No Known Allergies  Level of Care/Admitting Diagnosis ED Disposition     ED Disposition  Admit   Condition  --   Comment  Hospital Area: [facility] [[id number]]  Level of Care: Telemetry Cardiac [[id number]]  May admit patient to [facility] or [facility] if equivalent level of care is available:: No  Covid Evaluation: Asymptomatic - no recent exposure (last 10 days) testing not required  Diagnosis: Acute HFrEF (heart failure with reduced ejection fraction) [facility]) [[id number]]  Admitting Physician: Gery Pray [[id number]]  Attending Physician: Gery Pray [[id number]]  Certification:: I certify this patient will need inpatient services for at least 2 midnights  Estimated Length of Stay: 2          B Medical/Surgery History Past Medical History:  Diagnosis Date   Coronary artery disease    Hyperlipidemia    Hypertension    Myocardial infarction (HCC)    PVD (peripheral vascular disease) (HCC)    S/P angioplasty with stent to Lt. iliac 08/15/12 08/15/2012   Past Surgical History:  Procedure Laterality Date   2D Echocardiogram  08/25/2004   EF >55%, LA-moderately dilated.   CARDIAC CATHETERIZATION   03/11/2002   RCA subtotally occluded with TIMI-1 to1/2 flow stented with a 3x23 Cypher DES resulting in reduction of occlusion to 0% with TIMI-3 flow   CORONARY BALLOON ANGIOPLASTY N/A 02/22/2023   Procedure: CORONARY BALLOON ANGIOPLASTY;  Surgeon: Corky Crafts, MD;  [facility]: [facility] INVASIVE CV LAB;  Service: Cardiovascular;  Laterality: N/A;   CORONARY THROMBECTOMY N/A 02/22/2023   Procedure: Coronary Thrombectomy;  Surgeon: Corky Crafts, MD;  Location: [facility] INVASIVE CV LAB;  Service: Cardiovascular;  Laterality: N/A;   LEA Doppler  08/29/2012   L SFA Proximal: 70-99% diameter reduction. Velocities suggest upper end of range-new finding when compared to previous study. L EIA stent: opne and patent without evidence of restenosis   LEFT HEART CATH AND CORONARY ANGIOGRAPHY N/A 07/07/2022   Procedure: LEFT HEART CATH AND CORONARY ANGIOGRAPHY;  Surgeon: [name], Peter M, MD;  Location: [facility] INVASIVE CV LAB;  Service: Cardiovascular;  Laterality: N/A;   LEFT HEART CATH AND CORONARY ANGIOGRAPHY N/A 02/22/2023   Procedure: LEFT HEART CATH AND CORONARY ANGIOGRAPHY;  Surgeon: Corky Crafts, MD;  Location: [facility] INVASIVE CV LAB;  Service: Cardiovascular;  Laterality: N/A;   Lexiscan Myoview  07/19/2012   No significant ECG changes, mild inferobasal hypokinesis with normal LV function, low risk stress nuclear study   PV Intervention  08/15/2012   Occluded L External Iliac-stented with a 10x51mm Abbott Nitinol Absolute Pro self-expanding stent. 75% stenosis in L Common Femoral artery stented witha 8x70mm lon Absolute Nitinol Absolute Po self-expanding stent.  A IV Location/Drains/Wounds Patient Lines/Drains/Airways Status     Active Line/Drains/Airways     Name Placement date Placement time Site Days   Peripheral IV 03/04/23 18 G Left Antecubital 03/04/23  0405  Antecubital  less than 1            Intake/Output Last 24 hours No intake or output data in the 24 hours ending 03/04/23  0831  Labs/Imaging Results for orders placed or performed during the hospital encounter of 03/04/23 (from the past 48 hour(s))  CBC with Differential     Status: Abnormal   Collection Time: 03/04/23  3:00 AM  Result Value Ref Range   WBC 12.2 (H) 4.0 - 10.5 K/uL   RBC 4.07 (L) 4.22 - 5.81 MIL/uL   Hemoglobin 12.4 (L) 13.0 - 17.0 g/dL   HCT 16.1 09.6 - 04.5 %   MCV 96.3 80.0 - 100.0 fL   MCH 30.5 26.0 - 34.0 pg   MCHC 31.6 30.0 - 36.0 g/dL   RDW 40.9 81.1 - 91.4 %   Platelets 361 150 - 400 K/uL   nRBC 0.0 0.0 - 0.2 %   Neutrophils Relative % 82 %   Neutro Abs 10.1 (H) 1.7 - 7.7 K/uL   Lymphocytes Relative 8 %   Lymphs Abs 1.0 0.7 - 4.0 K/uL   Monocytes Relative 7 %   Monocytes Absolute 0.9 0.1 - 1.0 K/uL   Eosinophils Relative 1 %   Eosinophils Absolute 0.1 0.0 - 0.5 K/uL   Basophils Relative 1 %   Basophils Absolute 0.1 0.0 - 0.1 K/uL   Immature Granulocytes 1 %   Abs Immature Granulocytes 0.07 0.00 - 0.07 K/uL    Comment: Performed at [facility], [address] [place], [place] [address]  Basic metabolic panel     Status: Abnormal   Collection Time: 03/04/23  3:00 AM  Result Value Ref Range   Sodium 133 (L) 135 - 145 mmol/L   Potassium 4.8 3.5 - 5.1 mmol/L   Chloride 101 98 - 111 mmol/L   CO2 24 22 - 32 mmol/L   Glucose, Bld 141 (H) 70 - 99 mg/dL    Comment: Glucose reference range applies only to samples taken after fasting for at least 8 hours.   BUN 16 8 - 23 mg/dL   Creatinine, Ser 6.21 (H) 0.61 - 1.24 mg/dL   Calcium 8.2 (L) 8.9 - 10.3 mg/dL   GFR, Estimated 48 (L) >60 mL/min    Comment: (NOTE) Calculated using the CKD-EPI Creatinine Equation (2021)    Anion gap 8 5 - 15    Comment: Performed at [facility], [address] [place], [place] [address]  Brain natriuretic peptide     Status: Abnormal   Collection Time: 03/04/23  3:00 AM  Result Value Ref Range   B Natriuretic Peptide 395.6 (H) 0.0 - 100.0 pg/mL    Comment: Performed at [facility], [address], [place], [place] [address]  Troponin I (High Sensitivity)     Status: Abnormal   Collection Time: 03/04/23  3:00 AM  Result Value Ref Range   Troponin I (High Sensitivity) 3,937 (HH) <18 ng/L    Comment: CRITICAL RESULT CALLED TO, READ BACK BY AND VERIFIED WITH Heloise Purpura, RN. [phone] 03/04/23. LPAIT (NOTE) Elevated high sensitivity troponin I (hsTnI) values and significant  changes across serial measurements may suggest ACS but many other  chronic and acute conditions are known to elevate hsTnI results.  Refer to the "Links" section for chest pain algorithms and additional  guidance. Performed at [facility], [address], [place], [place] [address]   Troponin I (High Sensitivity)     Status: Abnormal   Collection Time: 03/04/23  4:50 AM  Result Value Ref Range   Troponin I (High Sensitivity) 3,947 (HH) <18 ng/L    Comment: CRITICAL VALUE NOTED. VALUE IS CONSISTENT WITH PREVIOUSLY REPORTED/CALLED VALUE (NOTE) Elevated high sensitivity troponin I (hsTnI) values and significant  changes across serial measurements may suggest ACS but many other  chronic and acute conditions are known to elevate hsTnI results.  Refer to the "Links" section for chest pain algorithms and additional  guidance. Performed at [facility], [address] [place], [place] [address]    DG Chest Port 1 View  Result Date: 03/04/2023 CLINICAL DATA:  Chest pain. EXAM: PORTABLE CHEST 1 VIEW COMPARISON:  February 22, 2023 FINDINGS: The cardiac silhouette is moderately enlarged and stable in appearance. Mild to moderate severity diffuse, chronic appearing increased interstitial lung markings are seen. Mild areas of atelectasis and/or infiltrate are seen within the mid left lung and bilateral lung bases. No pleural effusion or pneumothorax is identified. The visualized skeletal structures are unremarkable. IMPRESSION: Chronic appearing increased interstitial lung markings with mild  areas of atelectasis and/or infiltrate within the mid left lung and bilateral lung bases. Electronically Signed   By: Aram Candela M.D.   On: 03/04/2023 03:31    Pending Labs Unresulted Labs (From admission, onward)     Start     Ordered   03/05/23 0500  Basic metabolic panel  Daily,   R     Comments: As Scheduled for 5 days    03/04/23 0530   03/05/23 0500  CBC with Differential/Platelet  Tomorrow morning,   R        03/04/23 0530   03/04/23 0728  D-dimer, quantitative  Once,   R        03/04/23 0728            Vitals/Pain Today's Vitals   03/04/23 0615 03/04/23 0630 03/04/23 0645 03/04/23 0651  BP: (!) 163/83 (!) 144/85 137/83   Pulse: 94 90 85   Resp: (!) 30 (!) 24 (!) 26   Temp:    98.9 F (37.2 C)  TempSrc:    Oral  SpO2: 96% 97% 97%   Weight:      Height:      PainSc:        Isolation Precautions No active isolations  Medications Medications  sodium chloride flush (NS) 0.9 % injection 3 mL (has no administration in time range)  sodium chloride flush (NS) 0.9 % injection 3 mL (has no administration in time range)  0.9 %  sodium chloride infusion (has no administration in time range)  acetaminophen (TYLENOL) tablet 650 mg (has no administration in time range)  ondansetron (ZOFRAN) injection 4 mg (has no administration in time range)  heparin injection 5,000 Units (5,000 Units Subcutaneous Patient Refused/Not Given 03/04/23 0537)  lisinopril (ZESTRIL) tablet 5 mg (has no administration in time range)  furosemide (LASIX) injection 20 mg (has no administration in time range)  furosemide (LASIX) injection 40 mg (40 mg Intravenous Given 03/04/23 0424)    Mobility walks     Focused Assessments Cardiac Assessment Handoff:    No results found for: "CKTOTAL", "CKMB", "CKMBINDEX", "TROPONINI" No results found for: "DDIMER" Does the Patient currently have chest pain? No  R Recommendations: See Admitting Provider Note  Report given to:   Additional  Notes: pt refused heparin injection d/t hx of GI bleed

## 2023-03-04 NOTE — H&P (Deleted)
PCP:   [name], MD   Chief Complaint:  Chest pain shortness of breath  HPI: This is a 72 year old male with past medical history of CAD recently admitted with STEMI 02/22/2023.  Patient had a LHC balloon angioplasty done.  Patient developed hematemesis after being placed on heparin and aspirin.  GI consulted, plans for EGD on the way however patient left AMA.  He was discharged on Protonix twice daily.  After discharge patient has not taken Protonix however his hematemesis resolved.  Patient previously admitted December 2023 with NSTEMI and atrial flutter.  After discharge patient patient declined to take any prescribed medication.  Per patient he was discharged with Eliquis and noted blood in the stool.  All medication were discontinued.  Patient states he is not inclined towards pharmaceuticals because of there are side effects.  His wife treats him with several herbal medications including cayenne pepper and Hawthorne berry. Last night around 10 PM he developed severe central located chest pain with no radiation.  He describes the pain as sharp, associate with mild shortness of breath.  It was persistent and constant, resolving after he arrived in the ER.  In the ER patient's troponin was 3,947.  However, this is down from 8, 739 on 02/22/2023.  EKG without acute ischemic changes.  Patient presenting vitals BP 153/83, HR 96, RR 26, afebrile.  Patient hypoxic needing 4 L oxygen.  CXR with chronic appearing interstitial markings with mild areas of atelectasis and/or infiltrates in the left lung bases B/L.  BNP 395.6.  Cardiology on-call Dr. Paulino Rily consulted suspect CHF.  Recommended trending troponins and holding heparin given history of GI bleed.  Review of Systems:  Per HPI  Past Medical History: Past Medical History:  Diagnosis Date   Coronary artery disease    Hyperlipidemia    Hypertension    Myocardial infarction [facility])    PVD (peripheral vascular disease) (HCC)    S/P angioplasty  with stent to Lt. iliac 08/15/12 08/15/2012   Past Surgical History:  Procedure Laterality Date   2D Echocardiogram  08/25/2004   EF >55%, LA-moderately dilated.   CARDIAC CATHETERIZATION  03/11/2002   RCA subtotally occluded with TIMI-1 to1/2 flow stented with a 3x23 Cypher DES resulting in reduction of occlusion to 0% with TIMI-3 flow   CORONARY BALLOON ANGIOPLASTY N/A 02/22/2023   Procedure: CORONARY BALLOON ANGIOPLASTY;  Surgeon: Corky Crafts, MD;  Location: [facility] INVASIVE CV LAB;  Service: Cardiovascular;  Laterality: N/A;   CORONARY THROMBECTOMY N/A 02/22/2023   Procedure: Coronary Thrombectomy;  Surgeon: Corky Crafts, MD;  Location: [facility] INVASIVE CV LAB;  Service: Cardiovascular;  Laterality: N/A;   LEA Doppler  08/29/2012   L SFA Proximal: 70-99% diameter reduction. Velocities suggest upper end of range-new finding when compared to previous study. L EIA stent: opne and patent without evidence of restenosis   LEFT HEART CATH AND CORONARY ANGIOGRAPHY N/A 07/07/2022   Procedure: LEFT HEART CATH AND CORONARY ANGIOGRAPHY;  Surgeon: [name], Peter M, MD;  Location: [facility] INVASIVE CV LAB;  Service: Cardiovascular;  Laterality: N/A;   LEFT HEART CATH AND CORONARY ANGIOGRAPHY N/A 02/22/2023   Procedure: LEFT HEART CATH AND CORONARY ANGIOGRAPHY;  Surgeon: Corky Crafts, MD;  Location: [facility] INVASIVE CV LAB;  Service: Cardiovascular;  Laterality: N/A;   Lexiscan Myoview  07/19/2012   No significant ECG changes, mild inferobasal hypokinesis with normal LV function, low risk stress nuclear study   PV Intervention  08/15/2012   Occluded L External Iliac-stented with  a 10x39mm Abbott Nitinol Absolute Pro self-expanding stent. 75% stenosis in L Common Femoral artery stented witha 8x50mm lon Absolute Nitinol Absolute Po self-expanding stent.    Medications: Prior to Admission medications   Medication Sig Start Date End Date Taking? Authorizing Provider  ASCORBIC ACID PO Take 1 tablet by mouth  daily. Vitamin C, unknown strength.    [provider]  aspirin EC 325 MG tablet Take 1 tablet (325 mg total) by mouth daily. 02/23/23 03/25/23  Aundra Dubin, MD  b complex vitamins capsule Take 1 capsule by mouth daily.    [provider]  Cholecalciferol (VITAMIN D-3 PO) Take 1 capsule by mouth daily.    [provider]  CVS TRIPLE MAGNESIUM COMPLEX PO Take 1 tablet by mouth daily.    [provider]  nitroGLYCERIN (NITROSTAT) 0.4 MG SL tablet Place 1 tablet (0.4 mg total) under the tongue every 5 (five) minutes as needed for chest pain. 07/08/22   Arty Baumgartner, NP  pantoprazole (PROTONIX) 40 MG tablet Take 1 tablet (40 mg total) by mouth 2 (two) times daily. 02/23/23 02/23/24  Aundra Dubin, MD    Allergies:  No Known Allergies  Social History:  reports that he quit smoking about 24 years ago. His smoking use included cigarettes. He has never used smokeless tobacco. He reports that he does not drink alcohol and does not use drugs.  Family History: Family History  Problem Relation Age of Onset   Diabetes Mother    Heart disease Mother 30   Hyperlipidemia Mother    Kidney disease Mother    Heart disease Father 64   Cancer Father 51   Stroke Maternal Grandfather        Mid 73s   Lung disease Paternal Grandfather        Black Lung Disease    Physical Exam: Vitals:   03/04/23 0415 03/04/23 0430 03/04/23 0445 03/04/23 0500  BP: 97/66 (!) 156/81 (!) 145/85 (!) 140/92  Pulse: (!) 102 95 91 93  Resp: (!) 30 (!) 29 (!) 24 (!) 28  Temp:      TempSrc:      SpO2: 97% 96% 95% 97%  Weight:      Height:        General:  Alert and oriented times three, well developed and nourished, no acute distress Eyes: Pnk conjunctiva, no scleral icterus ENT: Moist oral mucosa, neck supple, +1 JVD Lungs: clear to ascultation, no wheeze, no crackles, no use of accessory muscles Cardiovascular: RRR, no regurgitation, no gallops, no murmurs. No carotid  bruits, no JVD Abdomen: soft, positive BS, non-tender, non-distended, no organomegaly, not an acute abdomen GU: not examined Neuro: CN II - XII grossly intact, sensation intact Musculoskeletal: strength 5/5 all extremities, no edema Skin: no rash, no subcutaneous crepitation, no decubitus Psych: appropriate patient   Labs on Admission:  Recent Labs    03/04/23 0300  NA 133*  K 4.8  CL 101  CO2 24  GLUCOSE 141*  BUN 16  CREATININE 1.55*  CALCIUM 8.2*    Recent Labs    03/04/23 0300  WBC 12.2*  NEUTROABS 10.1*  HGB 12.4*  HCT 39.2  MCV 96.3  PLT 361    Micro Results: Recent Results (from the past 240 hour(s))  MRSA Next Gen by PCR, Nasal     Status: None   Collection Time: 02/22/23  6:57 PM   Specimen: Nasal Mucosa; Nasal Swab  Result Value Ref Range Status  MRSA by PCR Next Gen NOT DETECTED NOT DETECTED Final    Comment: (NOTE) The GeneXpert MRSA Assay (FDA approved for NASAL specimens only), is one component of a comprehensive MRSA colonization surveillance program. It is not intended to diagnose MRSA infection nor to guide or monitor treatment for MRSA infections. Test performance is not FDA approved in patients less than 70 years old. Performed at [facility], [address], [place], [place] [address]      Radiological Exams on Admission: DG Chest Port 1 View  Result Date: 03/04/2023 CLINICAL DATA:  Chest pain. EXAM: PORTABLE CHEST 1 VIEW COMPARISON:  February 22, 2023 FINDINGS: The cardiac silhouette is moderately enlarged and stable in appearance. Mild to moderate severity diffuse, chronic appearing increased interstitial lung markings are seen. Mild areas of atelectasis and/or infiltrate are seen within the mid left lung and bilateral lung bases. No pleural effusion or pneumothorax is identified. The visualized skeletal structures are unremarkable. IMPRESSION: Chronic appearing increased interstitial lung markings with mild areas of atelectasis and/or  infiltrate within the mid left lung and bilateral lung bases. Electronically Signed   By: Aram Candela M.D.   On: 03/04/2023 03:31    Assessment/Plan Present on Admission:  Acute respiratory failure / chest pain  Recent STEMI (1 wk ago)/NSTEMI (w/in last 8 months)  Medical noncompliance -Differential diagnosis includes CHF vs pneumonia.  Currently inconclusive -CHF order set initiated, low-dose Lasix.  Patient appears relatively euvolemic with mild JVD. -CT chest without contrast will be ordered to better differentiate.  D-dimer ordered r/o PE -Patient's most recent echo showed EF of 40 to 45% -Continue oxygen -Cardiology on board   Atrial fibrillation (HCC)  HTN (hypertension)  Hyperlipidemia  CKD stage III -Patient not inclined to taking prescription medication.  For now patient's SBP in the 160s. EKG NSR.  Patient and family.  He needs time and education on the benefits and side effects of medication ordered.  Approximately 30 minutes spent in discussion.  For now all medications not resumed.  Defer to a.m. team/cardiology continued education and reinstitution of needed medications.   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

## 2023-03-04 NOTE — ED Notes (Signed)
Lisa S, PA at bedside. 

## 2023-03-04 NOTE — Consult Note (Addendum)
Cardiology Consultation   Patient ID: Jeffrey Norton MRN: [id number]; DOB: March 28, 1951  Admit date: 03/04/2023 Date of Consult: 03/04/2023  PCP:  [name], MD   [facility] Providers Cardiologist:  Nanetta Batty, MD        Patient Profile:   Jeffrey Norton is a 72 y.o. male with a hx of CAD with multiple PCI of RCA, PAD s/p stenting of left external iliac artery 2014, atrial flutter, hypertension, hyperlipidemia, remote tobacco abuse and COVID-19 infection in 2021 with chronic cough who is being seen 03/04/2023 for the evaluation of chest pain and elevated troponin at the request of Dr. Tyson Babinski.  History of Present Illness:   Jeffrey Norton is a 72 year old male with past medical history of CAD with multiple PCI of RCA, PAD s/p stenting of left external iliac artery 2014, atrial flutter, hypertension, hyperlipidemia, remote tobacco abuse and COVID-19 infection in 2021 with chronic cough.  Patient had inferior STEMI and underwent DES to RCA in 2003, DES to RCA in 2005 due to in-stent restenosis.  He was hospitalized in December 2023 with severe chest pain.  EKG at the time revealed 2-1 atrial flutter with ST elevation in V2-V3.  ST elevation was felt to be due to LVH but due to persistent chest pain and elevated troponin of 200, he was taken to the Cath Lab.  Cardiac catheterization revealed 45% proximal to mid LAD, 40% ostial to proximal left circumflex lesion, 40% OM1, 50% in-stent restenosis in proximal to mid RCA, EF 55 to 65%, trivial AI.  His chest discomfort and troponin elevation was felt to be demand ischemia in the setting of new onset of atrial flutter.  Echocardiogram showed EF 45 to 50%, global hypokinesis, grade 2 DD.  He underwent IV diuresis.  He was also placed on metoprolol and Eliquis.  He converted to sinus rhythm prior to discharge.  After discharge, he had hematochezia and stopped taking Eliquis.    He was readmitted to the hospital on 02/22/2019 for was  inferior STEMI.  Cardiac catheterization performed on 02/22/2023 showed 100% occluded proximal to mid RCA treated with balloon angioplasty, 45% proximal to mid LAD lesion, 40% ostial to proximal left circumflex lesion.  It was suspected the patient had late presenting inferior MI and stent occlusion likely occurred 48 hours prior to arrival.  During the cardiac catheterization, he had hemodynamic instability requiring low-dose dopamine.  Due to extensive thrombus, it was felt patient will not benefit from RCA stent, he was left on IV heparin for 48 hours.  He was also placed on aspirin and Brilinta.  Hospitalization complicated by mild AKI with creatinine trended up to 2.0 before coming back down to 1.7.  Unfortunately, he developed hematemesis and had several bouts of melanic stools, GI service was consulted who recommended EGD.  He was placed on IV Protonix for GI protection.  Patient refused to sign consent for EGD and ultimately left the hospital AMA on the night of 02/23/2023.  According to cardiology fellow's note, due to GI bleed risk, patient was not discharged on P2Y12 inhibitor, instead he was discharged on 325 mg daily of aspirin.  He was also on metoprolol heart rate.    After he got home, he self discontinued aspirin due to concern for GI bleed.  Instead, his wife placed him on vitamin and herbal supplements, co-Q10, cayenne peppers and hawthorne berries.  He has not had any recurrent melena or hematemesis.  He has been able to walk around  without any new issue or recurrent chest discomfort.  He developed recurrent chest pain around 10 PM on the night of 03/03/2023, chest pain is similar to the previous angina.  This prompted the patient to seek urgent medical attention at [facility], ED.  He has been admitted by hospitalist service.  Serial troponin was 3937--> 3947.  Last high-sensitivity troponin from 10 days ago was 8739.  BNP mildly elevated at 395.  Creatinine 1.55.  White blood cell count 12.2,  hemoglobin 12.4.  D-dimer elevated 1.56.  CT of the chest without contrast showed asymmetric dependent airspace disease in the right lung base suspicious for pneumonia, possibly on basis of aspiration, cavitary subpleural nodule in the right upper lobe measuring up to 2.1 cm, suspicious for bronchogenic carcinoma, nonspecific mediastinal and hilar adenopathy, new small pericardial effusion, stable incidental right adrenal adenoma, chronic lung disease consistent with emphysema.  On arrival, he was also hypoxic requiring 4 L nasal cannula, this has been weaned down to 2 L overnight.  Patient was given 40 mg +20 mg IV Lasix for suspected volume overload.  Cardiology service consulted for chest pain and elevated troponin.  At the time of interview, he had a total of 14 hours of chest discomfort, he currently rates his chest pain as 0.5 out 10, barely noticeable.    Past Medical History:  Diagnosis Date   Coronary artery disease    Hyperlipidemia    Hypertension    Myocardial infarction [facility])    PVD (peripheral vascular disease) (HCC)    S/P angioplasty with stent to Lt. iliac 08/15/12 08/15/2012    Past Surgical History:  Procedure Laterality Date   2D Echocardiogram  08/25/2004   EF >55%, LA-moderately dilated.   CARDIAC CATHETERIZATION  03/11/2002   RCA subtotally occluded with TIMI-1 to1/2 flow stented with a 3x23 Cypher DES resulting in reduction of occlusion to 0% with TIMI-3 flow   CORONARY BALLOON ANGIOPLASTY N/A 02/22/2023   Procedure: CORONARY BALLOON ANGIOPLASTY;  Surgeon: Corky Crafts, MD;  Location: [facility] INVASIVE CV LAB;  Service: Cardiovascular;  Laterality: N/A;   CORONARY THROMBECTOMY N/A 02/22/2023   Procedure: Coronary Thrombectomy;  Surgeon: Corky Crafts, MD;  Location: [facility] INVASIVE CV LAB;  Service: Cardiovascular;  Laterality: N/A;   LEA Doppler  08/29/2012   L SFA Proximal: 70-99% diameter reduction. Velocities suggest upper end of range-new finding when compared to  previous study. L EIA stent: opne and patent without evidence of restenosis   LEFT HEART CATH AND CORONARY ANGIOGRAPHY N/A 07/07/2022   Procedure: LEFT HEART CATH AND CORONARY ANGIOGRAPHY;  Surgeon: [name], Peter M, MD;  Location: [facility] INVASIVE CV LAB;  Service: Cardiovascular;  Laterality: N/A;   LEFT HEART CATH AND CORONARY ANGIOGRAPHY N/A 02/22/2023   Procedure: LEFT HEART CATH AND CORONARY ANGIOGRAPHY;  Surgeon: Corky Crafts, MD;  Location: [facility] INVASIVE CV LAB;  Service: Cardiovascular;  Laterality: N/A;   Lexiscan Myoview  07/19/2012   No significant ECG changes, mild inferobasal hypokinesis with normal LV function, low risk stress nuclear study   PV Intervention  08/15/2012   Occluded L External Iliac-stented with a 10x32mm Abbott Nitinol Absolute Pro self-expanding stent. 75% stenosis in L Common Femoral artery stented witha 8x41mm lon Absolute Nitinol Absolute Po self-expanding stent.     Home Medications:  Prior to Admission medications   Medication Sig Start Date End Date Taking? Authorizing Provider  APPLE CIDER VINEGAR PO Take 1 Dose by mouth See admin instructions. 2 teaspoons honey + apple cider  vinegar to thin, every 6 hours as needed for cough.   Yes [provider]  Ascorbic Acid (VITAMIN C PO) Take 1 tablet by mouth daily.   Yes [provider]  aspirin EC 81 MG tablet Take 81 mg by mouth every other day.   Yes [provider]  b complex vitamins capsule Take 1 capsule by mouth daily.   Yes [provider]  Capsicum, Cayenne, (CAYENNE PEPPER PO) Take 1 tablet by mouth daily.   Yes [provider]  Cholecalciferol (VITAMIN D-3 PO) Take 1 capsule by mouth daily.   Yes [provider]  Coenzyme Q10 (COQ10 PO) Take 1 capsule by mouth daily.   Yes [provider]  Dipyridamole (PERSANTINE PO) Take 1 tablet by mouth daily.   Yes [provider]  HAWTHORNE BERRY PO Take 1 tablet by mouth daily.   Yes [provider]  MAGNESIUM PO Take 1 tablet by mouth daily.   Yes [provider]  metoprolol tartrate (LOPRESSOR) 50 MG tablet Take 50 mg by mouth 2 (two) times daily.   Yes [provider]  Multiple Vitamins-Minerals (ZINC PO) Take 1 tablet by mouth daily.   Yes [provider]  nitroGLYCERIN (NITROSTAT) 0.4 MG SL tablet Place 1 tablet (0.4 mg total) under the tongue every 5 (five) minutes as needed for chest pain. 07/08/22  Yes Arty Baumgartner, NP  OVER THE COUNTER MEDICATION Take 1 capsule by mouth daily. Black Cumin Seed.   Yes [provider]    Inpatient Medications: Scheduled Meds:  aspirin EC  81 mg Oral Daily   furosemide  20 mg Intravenous BID   heparin  5,000 Units Subcutaneous Q8H   lisinopril  5 mg Oral Daily   sodium chloride flush  3 mL Intravenous Q12H   Continuous Infusions:  sodium chloride     PRN Meds: sodium chloride, acetaminophen, ondansetron (ZOFRAN) IV, sodium chloride flush  Allergies:   No Known Allergies  Social History:   Social History   Socioeconomic History   Marital status: Married    Spouse name: Not on file   Number of children: Not on file   Years of education: Not on file   Highest education level: Not on file  Occupational History   Not on file  Tobacco Use   Smoking status: Former    Current packs/day: 0.00    Types: Cigarettes    Quit date: 08/16/1998    Years since quitting: 24.5   Smokeless tobacco: Never  Vaping Use   Vaping status: Never Used  Substance and Sexual Activity   Alcohol use: No   Drug use: No   Sexual activity: Not on file  Other Topics Concern   Not on file  Social History Narrative   Not on file   Social Determinants of Health   Financial Resource Strain: Not on file  Food Insecurity: No Food Insecurity (03/04/2023)   Hunger Vital Sign    Worried About Running Out of Food in the Last Year: Never true    Ran Out of Food in the Last Year: Never true  Transportation  Needs: No Transportation Needs (03/04/2023)   PRAPARE - Administrator, Civil Service (Medical): No    Lack of Transportation (Non-Medical): No  Physical Activity: Not on file  Stress: Not on file  Social Connections: Not on file  Intimate Partner Violence: Not At Risk (03/04/2023)   Humiliation, Afraid, Rape, and Kick questionnaire  Fear of Current or Ex-Partner: No    Emotionally Abused: No    Physically Abused: No    Sexually Abused: No    Family History:    Family History  Problem Relation Age of Onset   Diabetes Mother    Heart disease Mother 70   Hyperlipidemia Mother    Kidney disease Mother    Heart disease Father 24   Cancer Father 61   Stroke Maternal Grandfather        Mid 27s   Lung disease Paternal Grandfather        Black Lung Disease     ROS:  Please see the history of present illness.   All other ROS reviewed and negative.     Physical Exam/Data:   Vitals:   03/04/23 1132 03/04/23 1200 03/04/23 1230 03/04/23 1300  BP: (!) 148/76 (!) 141/81 137/78 131/72  Pulse: 81 83 84 83  Resp: (!) 25 (!) 24 (!) 24 18  Temp: 98.1 F (36.7 C)     TempSrc:      SpO2: 97% 96% 94% 93%  Weight:      Height:        Intake/Output Summary (Last 24 hours) at 03/04/2023 1315 Last data filed at 03/04/2023 1313 Gross per 24 hour  Intake --  Output 825 ml  Net -825 ml      03/04/2023    2:56 AM 02/22/2023    7:14 PM 02/22/2023    3:36 PM  Last 3 Weights  Weight (lbs) 187 lb 174 lb 2.6 oz 172 lb 2.9 oz  Weight (kg) 84.823 kg 79 kg 78.1 kg     Body mass index is 27.62 kg/m.  General:  Well nourished, well developed, in no acute distress HEENT: normal Neck: no JVD Vascular: No carotid bruits; Distal pulses 2+ bilaterally Cardiac:  normal S1, S2; RRR; no murmur  Lungs:  clear to auscultation bilaterally, no wheezing, rhonchi or rales  Abd: soft, nontender, no hepatomegaly  Ext: no edema Musculoskeletal:  No deformities, BUE and BLE strength normal and  equal Skin: warm and dry  Neuro:  CNs 2-12 intact, no focal abnormalities noted Psych:  Normal affect   EKG:  The EKG was personally reviewed and demonstrates:  NSR with Q wave in the inferior leads Telemetry:  Telemetry was personally reviewed and demonstrates:  NSR without significant ventricular ectopy  Relevant CV Studies:  Echo 02/22/2023  1. Global hypokinesis worse in septum apex and inferior basal wall. Left  ventricular ejection fraction, by estimation, is 40 to 45%. The left  ventricle has mildly decreased function. The left ventricle has no  regional wall motion abnormalities. There is   mild left ventricular hypertrophy. Left ventricular diastolic parameters  were normal.   2. Right ventricular systolic function is mildly reduced. The right  ventricular size is mildly enlarged.   3. Left atrial size was moderately dilated.   4. Right atrial size was mildly dilated.   5. The mitral valve is abnormal. Trivial mitral valve regurgitation. No  evidence of mitral stenosis.   6. The aortic valve is tricuspid. There is mild calcification of the  aortic valve. There is mild thickening of the aortic valve. Aortic valve  regurgitation is not visualized. Aortic valve sclerosis is present, with  no evidence of aortic valve stenosis.   7. Aortic dilatation noted. There is mild dilatation of the ascending  aorta, measuring 38 mm.   8. The inferior vena cava is dilated in size with >  50% respiratory  variability, suggesting right atrial pressure of 8 mmHg.    Laboratory Data:  High Sensitivity Troponin:   Recent Labs  Lab 02/22/23 0842 02/22/23 1150 03/04/23 0300 03/04/23 0450  TROPONINIHS 5,714* 8,739* 3,937* 3,947*     Chemistry Recent Labs  Lab 03/04/23 0300  NA 133*  K 4.8  CL 101  CO2 24  GLUCOSE 141*  BUN 16  CREATININE 1.55*  CALCIUM 8.2*  GFRNONAA 48*  ANIONGAP 8    No results for input(s): "PROT", "ALBUMIN", "AST", "ALT", "ALKPHOS", "BILITOT" in the last  168 hours. Lipids No results for input(s): "CHOL", "TRIG", "HDL", "LABVLDL", "LDLCALC", "CHOLHDL" in the last 168 hours.  Hematology Recent Labs  Lab 03/04/23 0300  WBC 12.2*  RBC 4.07*  HGB 12.4*  HCT 39.2  MCV 96.3  MCH 30.5  MCHC 31.6  RDW 14.3  PLT 361   Thyroid No results for input(s): "TSH", "FREET4" in the last 168 hours.  BNP Recent Labs  Lab 03/04/23 0300  BNP 395.6*    DDimer  Recent Labs  Lab 03/04/23 0728  DDIMER 1.56*     Radiology/Studies:  CT CHEST WO CONTRAST  Result Date: 03/04/2023 CLINICAL DATA:  Hypoxia, acute respiratory failure. Chest pain and pressure. EXAM: CT CHEST WITHOUT CONTRAST TECHNIQUE: Multidetector CT imaging of the chest was performed following the standard protocol without IV contrast. RADIATION DOSE REDUCTION: This exam was performed according to the departmental dose-optimization program which includes automated exposure control, adjustment of the mA and/or kV according to patient size and/or use of iterative reconstruction technique. COMPARISON:  No prior chest CT. Chest radiographs 03/04/2023 and 02/22/2023. Abdominal CTA 08/29/2012. FINDINGS: Cardiovascular: Atherosclerosis of the aorta, great vessels and coronary arteries. There are calcifications of the aortic valve. New mild cardiomegaly and a new small pericardial effusion. Mediastinum/Nodes: There are numerous prominent mediastinal and hilar lymph nodes, including a 1.3 cm short axis right paratracheal node on image 55/3 and a right infrahilar node measuring 1.1 cm short axis on image 85/3. No axillary adenopathy. The thyroid gland, trachea and esophagus demonstrate no significant findings. Lungs/Pleura: No pleural effusion or pneumothorax. Moderate centrilobular and paraseptal emphysema with scattered subpleural reticulation, architectural distortion and central airway thickening. Asymmetric dependent airspace disease at the right lung base suspicious for pneumonia, possibly on the basis  of aspiration. Cavitary subpleural nodule peripherally in the right upper lobe measuring 1.8 x 1.7 cm on image 54/4. This measures up to 2.1 cm on sagittal image 43/6. No other suspicious pulmonary nodules. Upper abdomen: No acute or suspicious findings are seen in the visualized upper abdomen. There is a stable right adrenal nodule measuring 2.5 cm and 8 HU consistent with an incidental lipid rich adrenal adenoma. No follow-up imaging recommended. Musculoskeletal/Chest wall: There is no chest wall mass or suspicious osseous finding. Unless specific follow-up recommendations are mentioned in the findings or impression sections, no imaging follow-up of any mentioned incidental findings is recommended. IMPRESSION: 1. Asymmetric dependent airspace disease at the right lung base suspicious for pneumonia, possibly on the basis of aspiration. 2. Cavitary subpleural nodule peripherally in the right upper lobe measuring up to 2.1 cm. This is nonspecific and could reflect a small abscess, although is suspicious for bronchogenic carcinoma. Short-term CT follow-up (2-3 months) suggested, and if persistent recommend further evaluation with PET-CT. 3. Nonspecific mediastinal and hilar adenopathy, potentially reactive. 4. New mild cardiomegaly and a new small pericardial effusion. 5. 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 6. Stable incidental right adrenal  adenoma. 7. Aortic Atherosclerosis (ICD10-I70.0) and Emphysema (ICD10-J43.9). Electronically Signed   By: Carey Bullocks M.D.   On: 03/04/2023 08:42   DG Chest Port 1 View  Result Date: 03/04/2023 CLINICAL DATA:  Chest pain. EXAM: PORTABLE CHEST 1 VIEW COMPARISON:  February 22, 2023 FINDINGS: The cardiac silhouette is moderately enlarged and stable in appearance. Mild to moderate severity diffuse, chronic appearing increased interstitial lung markings are seen. Mild areas of atelectasis and/or infiltrate are seen within the mid left lung and bilateral  lung bases. No pleural effusion or pneumothorax is identified. The visualized skeletal structures are unremarkable. IMPRESSION: Chronic appearing increased interstitial lung markings with mild areas of atelectasis and/or infiltrate within the mid left lung and bilateral lung bases. Electronically Signed   By: Aram Candela M.D.   On: 03/04/2023 03:31     Assessment and Plan:   NSTEMI  -Recently had late presenting inferior STEMI, underwent balloon angioplasty to totally occluded RCA.  Postprocedure, he was placed on aspirin, Brilinta and IV heparin for 24-48 hours. Hs troponin up to 8000.  -Unfortunately, he developed hematemesis and melena after the procedure.  He refused to consent for EGD.  He left AMA before GI service could do any further workup.  Overnight Fellow who met with the patient and family member instructed him to take 325 mg aspirin without Brilinta given active GI bleed.  His only cardiac medication on discharge was aspirin and metoprolol.  -Unfortunately since he left the hospital, he has decided to take himself off of aspirin due to concern for GI bleed.  He had recurrent chest discomfort on the night of 8/7, chest pain has been persistent until now.  Currently described the chest pain as 0.5 out of 10 in intensity.  Location is substernal without radiation.   -Will discuss with MD, previous EKG from 7/30 already showed evidence of inferior infarction. Today's EKG showed minimal J point elevation in the inferior leads, improved when compare to 7/29 EKG, still has deep Q wave in the inferior leads consistent with inferior wall infarct.  -Patient has been noncompliant with his medication.  He had almost 14 hours of continuous chest pain, currently his chest pain is barely noticeable at a 0.5 out of 10 in intensity.  Would medically manage his NSTEMI. Hesitant to consider plavix or restart brilinta due to recent GI bleed Discussed with Dr. Bjorn Pippin, although trop is elevated, however  remained flat which is reassuring, plan for repeat limited echo to reassess EF. Restart ASA. Consider restart beta blocker  Acute respiratory failure: O2 saturation 87% on arrival, required 4 L nasal cannula, now back down to 2 L/min.  Given 40 and 20 mg IV Lasix this morning.  Patient appears hypervolemic.  Does have diminished breath sounds in the right base, CT without contrast shows questionable pneumonia in the right base of the lung.  Continue IV lasix  Recent GI bleed: seen by GI during previous hospitalization, patient declined to consent for EGD. Hgb stable. However patient self discontinued all antiplatelet after leaving the hospital.   Recommend GI evaluation for EGD  Elevated d-dimer: per primary team  Lung nodule: 2.1 cm lung nodule noted on CT of chest without contrast, suspicious for bronchogenic carcinoma.  Will defer workup to primary team.  History of atrial flutter: Diagnosed in December 2023, was initially on metoprolol and Eliquis, self discontinued Eliquis due to hematochezia.  Still taking metoprolol at home, no recurrence of atrial flutter  PAD  Hypertension  Hyperlipidemia  Risk Assessment/Risk Scores:     TIMI Risk Score for Unstable Angina or Non-ST Elevation MI:   The patient's TIMI risk score is 4, which indicates a 20% risk of all cause mortality, new or recurrent myocardial infarction or need for urgent revascularization in the next 14 days.     For questions or updates, please contact [facility] Please consult [email or web address] for contact info under    Ramond Dial, [place]  03/04/2023 1:15 PM   Patient seen and examined.  Agree with above documentation.  Mr. Clenney is a 72 year old male with history of CAD status post multiple PCI's, PAD, atrial flutter, hypertension, prior tobacco use who we are consulted by Dr. Tyson Babinski for evaluation of chest pain and troponin elevation.  He has significant coronary history.  Had inferior STEMI in 2003,  underwent DES to RCA.  Developed in-stent restenosis and underwent second DES to RCA in 2005.  In 06/2022 he was hospitalized with NSTEMI and found to have nonobstructive CAD with 50% in-stent restenosis in proximal to mid RCA, 45% proximal to mid LAD, 40% ostial proximal LCx, 40% OM1.  Echocardiogram at that time showed EF 45 to 50%.  He was also noted to be in atrial flutter during that admission was started on Eliquis.  However on discharge he began having hematochezia and stopped taking Eliquis.  He was admitted 02/22/2023 with inferior STEMI, cath showed occluded proximal to mid RCA that was treated with POBA, no stents were placed.  He was started on aspirin, Brilinta, and heparin.  He developed hematemesis as well as melena.  GI was consulted and recommended EGD, however patient refused and ultimately left AMA on 02/23/2023.  Since he left AMA, he has not been taking any antiplatelet medications and reports no further bleeding.  However he states that last night around 10 PM he had recurrence of his chest pain.  Describes as sharp pain in center of his chest, was continuous for hours but currently chest pain-free.  Initial vital signs notable for BP 151/78, pulse 106, SpO2 87% on room air.  Labs notable for creatinine 1.55, down from 1.76 on 02/23/2023, hemoglobin 12.4, WBC 12.2, BNP 396, troponin 3937 > 3947.  EKG shows sinus tachycardia, rate 104, inferior Q waves, poor R wave progression.  Chest CT was done which showed asymmetric dependent airspace disease at right lung base, cavitary subpleural nodule measuring 2.1 cm suspicious for bronchogenic carcinoma versus small abscess.  On exam, patient is alert and oriented, regular rate and rhythm, no murmurs, diminished breath sounds, no LE edema, +JVD.  For his chest pain/troponin elevation, troponin is significantly elevated but flat, may reflect downtrending troponin from recent MI.  Given his recent gastrointestinal bleeding and noncompliance with  medications, he is a poor candidate for repeat cardiac catheterization.  Recommend restarting aspirin 81 mg daily and he needs GI evaluation for EGD to evaluate source of recent GI bleed, as he previously left AMA before this could be done.  He does appear volume overloaded on exam and suspect decompensated heart failure is contributing to his symptoms.  Agree with IV diuresis.  Will check limited echocardiogram to evaluate systolic function.  Little Ishikawa, MD

## 2023-03-04 NOTE — ED Notes (Signed)
Patient insisting that he DOES NOT want to be placed on any blood thinners d/t GI bleeding during previous admission.

## 2023-03-04 NOTE — ED Provider Notes (Signed)
[place] EMERGENCY DEPARTMENT AT [facility] Provider Note   CSN: [id number] Arrival date & time: 03/04/23  0249     History  Chief Complaint  Patient presents with   Chest Pain    Jeffrey Norton is a 72 y.o. male.  The history is provided by the patient and medical records.  Chest Pain Associated symptoms: shortness of breath    72 y.o. M with hx of CAD, AFIB, HLP, presenting to the ED for chest pain.  Patient just had recent NSTEMI, underwent balloon angioplasty of RCA stent on 02/22/23, complicated by subsequent hematemesis after anticoagulation and ultimately left AMA from the hospital on 03/25/28 without having EGD done.  He states after he left he felt fine for the past few days-- no further hematemesis, no blood in the stools.  He was not having any chest pain until last night around 10PM when pain returned.  Pain localized to substernal region without radiation he does report some SOB.  Denies cough/fever.    Home Medications Prior to Admission medications   Medication Sig Start Date End Date Taking? Authorizing Provider  ASCORBIC ACID PO Take 1 tablet by mouth daily. Vitamin C, unknown strength.    [provider]  aspirin EC 325 MG tablet Take 1 tablet (325 mg total) by mouth daily. 02/23/23 03/25/23  Aundra Dubin, MD  b complex vitamins capsule Take 1 capsule by mouth daily.    [provider]  Cholecalciferol (VITAMIN D-3 PO) Take 1 capsule by mouth daily.    [provider]  CVS TRIPLE MAGNESIUM COMPLEX PO Take 1 tablet by mouth daily.    [provider]  nitroGLYCERIN (NITROSTAT) 0.4 MG SL tablet Place 1 tablet (0.4 mg total) under the tongue every 5 (five) minutes as needed for chest pain. 07/08/22   Arty Baumgartner, NP  pantoprazole (PROTONIX) 40 MG tablet Take 1 tablet (40 mg total) by mouth 2 (two) times daily. 02/23/23 02/23/24  Aundra Dubin, MD      Allergies    Patient has no known allergies.    Review  of Systems   Review of Systems  Respiratory:  Positive for shortness of breath.   Cardiovascular:  Positive for chest pain.  All other systems reviewed and are negative.   Physical Exam Updated Vital Signs BP (!) 151/78 (BP Location: Right Arm)   Pulse (!) 106   Temp 99.3 F (37.4 C) (Oral)   Resp (!) 24   Ht 5\' 9"  (1.753 m)   Wt 84.8 kg   SpO2 92%   BMI 27.62 kg/m   Physical Exam Vitals and nursing note reviewed.  Constitutional:      Appearance: He is well-developed.  HENT:     Head: Normocephalic and atraumatic.  Eyes:     Conjunctiva/sclera: Conjunctivae normal.     Pupils: Pupils are equal, round, and reactive to light.  Cardiovascular:     Rate and Rhythm: Normal rate and regular rhythm.     Heart sounds: Normal heart sounds.  Pulmonary:     Effort: Pulmonary effort is normal.     Breath sounds: Normal breath sounds.     Comments: 4L O2 in use Abdominal:     General: Bowel sounds are normal.     Palpations: Abdomen is soft.  Musculoskeletal:        General: Normal range of motion.     Cervical back: Normal range of motion.  Skin:    General: Skin  is warm and dry.  Neurological:     Mental Status: He is alert and oriented to person, place, and time.     ED Results / Procedures / Treatments   Labs (all labs ordered are listed, but only abnormal results are displayed) Labs Reviewed  CBC WITH DIFFERENTIAL/PLATELET - Abnormal; Notable for the following components:      Result Value   WBC 12.2 (*)    RBC 4.07 (*)    Hemoglobin 12.4 (*)    Neutro Abs 10.1 (*)    All other components within normal limits  BASIC METABOLIC PANEL - Abnormal; Notable for the following components:   Sodium 133 (*)    Glucose, Bld 141 (*)    Creatinine, Ser 1.55 (*)    Calcium 8.2 (*)    GFR, Estimated 48 (*)    All other components within normal limits  BRAIN NATRIURETIC PEPTIDE - Abnormal; Notable for the following components:   B Natriuretic Peptide 395.6 (*)    All  other components within normal limits  TROPONIN I (HIGH SENSITIVITY) - Abnormal; Notable for the following components:   Troponin I (High Sensitivity) 9,147 (*)    All other components within normal limits    EKG EKG Interpretation Date/Time:  Thursday March 04 2023 02:55:45 EDT Ventricular Rate:  104 PR Interval:  150 QRS Duration:  105 QT Interval:  333 QTC Calculation: 438 R Axis:   -26  Text Interpretation: Sinus tachycardia Inferior infarct, old No significant change since last tracing Confirmed by Melene Plan [phone]) on 03/04/2023 3:02:33 AM  Radiology DG Chest Port 1 View  Result Date: 03/04/2023 CLINICAL DATA:  Chest pain. EXAM: PORTABLE CHEST 1 VIEW COMPARISON:  February 22, 2023 FINDINGS: The cardiac silhouette is moderately enlarged and stable in appearance. Mild to moderate severity diffuse, chronic appearing increased interstitial lung markings are seen. Mild areas of atelectasis and/or infiltrate are seen within the mid left lung and bilateral lung bases. No pleural effusion or pneumothorax is identified. The visualized skeletal structures are unremarkable. IMPRESSION: Chronic appearing increased interstitial lung markings with mild areas of atelectasis and/or infiltrate within the mid left lung and bilateral lung bases. Electronically Signed   By: Aram Candela M.D.   On: 03/04/2023 03:31    Procedures Procedures    Medications Ordered in ED Medications  furosemide (LASIX) injection 40 mg (has no administration in time range)    ED Course/ Medical Decision Making/ A&P                                 Medical Decision Making Amount and/or Complexity of Data Reviewed Labs: ordered. Radiology: ordered and independent interpretation performed. ECG/medicine tests: ordered and independent interpretation performed.  Risk Prescription drug management. Decision regarding hospitalization.   72 y.o. M here with chest pain.  Recent cath with balloon angioplasty 02/22/23.   Subsequently developed GI bleed from heparin while here and left AMA.  He denies any ongoing bleeding, chest pain recurred last night around 10PM.  Does feel SOB, currently requiring 4L and not generally O2 dependent.  EKG without acute changes from prior.  Trop today [id number] (previously peaked at [facility]).  No electrolyte derangement.  WBC count 12.2.  BNP 395.  CXR   3:53 AM Spoke with cardiology, Dr. Paulino Rily-- recommends admission to medicine for hypoxia and suspected CHF.  Cardiology will formally consult in the morning.  Trend trops for now.  Hold on  any heparin given his hx of GI bleeding, can continue nitroglycerin as needed.    Spoke with hospitalist, Dr. Joneen Roach-- will admit for ongoing care.  Final Clinical Impression(s) / ED Diagnoses Final diagnoses:  Hypoxia  Chest pain, unspecified type    Rx / DC Orders ED Discharge Orders     None         Garlon Hatchet, PA-C 03/04/23 0421    Melene Plan, DO 03/04/23 [phone]

## 2023-03-04 NOTE — Hospital Course (Addendum)
Patient is a  72 year old male with past medical history of CAD recently admitted with STEMI 02/22/2023 status post LHC balloon angioplasty had developed hematemesis after being placed on heparin and aspirin.  At that time GI was consulted and plans were to have a EGD but the patient had left AGAINST MEDICAL ADVICE.  He was however given Protonix twice daily.  After discharge patient never took the Protonix since his hematemesis resolved.  Patient previously admitted December 2023 with NSTEMI and atrial flutter.  He was prescribed medication but had not been taking it.  At this time, presented to hospital with acute onset of central chest pain with mild shortness of breath.  In the ED, vitals were stable.  Troponin was elevated at 3947.   EKG without acute ischemic changes.  CXR with chronic appearing interstitial markings with mild areas of atelectasis and/or infiltrates in the left lung bases B/L.  Patient was noted to be hypoxic with a pulse ox of 87% on room air on presentation and required supplemental oxygen.  BNP was 395.6.  Cardiology was consulted and patient was then admitted to the hospital.  Assessment and plan.   Acute hypoxic respiratory failure Likely secondary to congestive heart failure.  CHF protocol.  Had mild JVD on presentation.  BNP elevated at 395.  Most recent 2D echocardiogram showed LV ejection fraction of 40 to 45%.  Cardiology has been consulted.  PCR negative.  Temperature max of 99.3.  Chest discomfort with mildly elevated troponins.  History of ST elevation MI.  Status post cardiac catheterization on 02/22/2023 with coronary balloon angioplasty and thrombectomy.  He was recommended aspirin and Brilinta for minimum of 12 months.  Was not taking medications as outpatient.  EKG normal sinus rhythm.  Cardiology has been consulted, will follow for recommendation.  Heparin on hold due to previous history of GI bleed.  Mild hyponatremia.  Will continue to monitor.  Check BMP in  AM.  HTN (hypertension) Not taking medications.  Hyperlipidemia  CKD stage III.  Creatinine today at 1.5.  Continue to monitor BMP.  Mild leukocytosis.  Could be reactive.  No overt signs of infection.   PVD. LSFA PTA 08/15/12, residual Rt disease but minimal symptoms.

## 2023-03-04 NOTE — Progress Notes (Signed)
PROGRESS NOTE    Jeffrey Norton  ZOX:[id number] DOB: [birth year] DOA: 03/04/2023 PCP: [name], MD    Brief Narrative:   Patient is a  72 year old male with past medical history of CAD recently admitted with STEMI 02/22/2023 status post LHC balloon angioplasty had developed hematemesis after being placed on heparin and aspirin.  At that time, GI was consulted and plans were to have a EGD but the patient had left AGAINST MEDICAL ADVICE.  He was however given Protonix twice daily.  After discharge patient never took the Protonix and his hematemesis resolved.  Patient previously admitted December 2023 with NSTEMI and atrial flutter.  He was prescribed medication but had not been taking it.    At this time, presented to hospital with acute onset of central chest pain with mild shortness of breath.  In the ED, vitals were stable.  Troponin was elevated at 3947.   EKG without acute ischemic changes.  CXR with chronic appearing interstitial markings with mild areas of atelectasis and/or infiltrates in the left lung bases B/L.  Patient was noted to be hypoxic with a pulse ox of 87% on room air on presentation and required supplemental oxygen.  BNP was 395.6.  Cardiology was consulted and patient was then admitted to the hospital.  Assessment and plan.   Acute hypoxic respiratory failure Likely secondary to congestive heart failure.  CHF protocol.  Had mild JVD on presentation.  BNP elevated at 395.  Most recent 2D echocardiogram showed LV ejection fraction of 40 to 45%.  Cardiology has been consulted.  PCR negative.  Temperature max of 99.3.  On IV Lasix 20 twice daily at this time.  Will continue to monitor closely.  Wean oxygen as able.  Chest discomfort with mildly elevated troponins.  Likely non-ST elevation MI.  Patient does have chest discomfort.  History of ST elevation MI.  Status post cardiac catheterization on 02/22/2023 with coronary balloon angioplasty and thrombectomy.  He was  recommended aspirin and Brilinta for minimum of 12 months.  Was not taking medications as outpatient.  EKG normal sinus rhythm.  Cardiology has been consulted, will follow for recommendation.  Heparin on hold due to previous history of GI bleed.  Check 2D echocardiogram.  Plan is conservative management at this time.  History of patient GI bleed.  Had declined EGD previous admission.  Self improved since he discontinued antiplatelets after leaving the hospital.  Lung nodule.  2.1 cm on the CT scan of the chest without contrast suspicious for bronchogenic carcinoma.  Radiology recommend short-term CT follow-up in 2 to 3 months and if persistent recommend PET CT scan.  Will need to follow-up with pulmonary clinic on discharge.  History of atrial flutter.  Diagnosed December 2023.  Was initially on metoprolol and Eliquis but Eliquis discontinued due to hematochezia.  On metoprolol.  Has improved at this time.  Mild hyponatremia.  Will continue to monitor.  Check BMP in AM.  HTN (hypertension) On metoprolol at home.  Will continue.  Hyperlipidemia  CKD stage III.  Creatinine today at 1.5.  Continue to monitor BMP.  Mild leukocytosis.  Could be reactive.  No overt signs of infection.   PVD. LSFA PTA 08/15/12, residual Rt disease but minimal symptoms.    DVT prophylaxis: heparin injection 5,000 Units Start: 03/04/23 0600   Code Status:     Code Status: Full Code  Disposition: Home likely in 1 to 2 days.  Status is: Inpatient Remains inpatient appropriate because: Non-ST elevation  MI, further observation, cardiology follow-up.   Family Communication: Spoke with the patient's spouse at bedside.  Consultants:  Cardiology  Procedures:  None  Antimicrobials:  None  Anti-infectives (From admission, onward)    None      Subjective: Today, patient was seen and examined at bedside.  Patient complains of mild chest discomfort.  Shortness of breath is better.  Patient's wife at  bedside.  Objective: Vitals:   03/04/23 1132 03/04/23 1200 03/04/23 1230 03/04/23 1300  BP: (!) 148/76 (!) 141/81 137/78 131/72  Pulse: 81 83 84 83  Resp: (!) 25 (!) 24 (!) 24 18  Temp: 98.1 F (36.7 C)     TempSrc:      SpO2: 97% 96% 94% 93%  Weight:      Height:        Intake/Output Summary (Last 24 hours) at 03/04/2023 1335 Last data filed at 03/04/2023 1313 Gross per 24 hour  Intake --  Output 825 ml  Net -825 ml   Filed Weights   03/04/23 0256  Weight: 84.8 kg    Physical Examination: Body mass index is 27.62 kg/m.   General:  Average built, not in obvious distress HENT:   No scleral pallor or icterus noted. Oral mucosa is moist.  Chest:  Clear breath sounds.   No crackles or wheezes.  CVS: S1 &S2 heard. No murmur.  Regular rate and rhythm. Abdomen: Soft, nontender, nondistended.  Bowel sounds are heard.   Extremities: No cyanosis, clubbing or edema.  Peripheral pulses are palpable. Psych: Alert, awake and oriented, normal mood CNS:  No cranial nerve deficits.  Power equal in all extremities.   Skin: Warm and dry.  No rashes noted.  Data Reviewed:   CBC: Recent Labs  Lab 03/04/23 0300  WBC 12.2*  NEUTROABS 10.1*  HGB 12.4*  HCT 39.2  MCV 96.3  PLT 361    Basic Metabolic Panel: Recent Labs  Lab 03/04/23 0300  NA 133*  K 4.8  CL 101  CO2 24  GLUCOSE 141*  BUN 16  CREATININE 1.55*  CALCIUM 8.2*    Liver Function Tests: No results for input(s): "AST", "ALT", "ALKPHOS", "BILITOT", "PROT", "ALBUMIN" in the last 168 hours.   Radiology Studies: CT CHEST WO CONTRAST  Result Date: 03/04/2023 CLINICAL DATA:  Hypoxia, acute respiratory failure. Chest pain and pressure. EXAM: CT CHEST WITHOUT CONTRAST TECHNIQUE: Multidetector CT imaging of the chest was performed following the standard protocol without IV contrast. RADIATION DOSE REDUCTION: This exam was performed according to the departmental dose-optimization program which includes automated exposure  control, adjustment of the mA and/or kV according to patient size and/or use of iterative reconstruction technique. COMPARISON:  No prior chest CT. Chest radiographs 03/04/2023 and 02/22/2023. Abdominal CTA 08/29/2012. FINDINGS: Cardiovascular: Atherosclerosis of the aorta, great vessels and coronary arteries. There are calcifications of the aortic valve. New mild cardiomegaly and a new small pericardial effusion. Mediastinum/Nodes: There are numerous prominent mediastinal and hilar lymph nodes, including a 1.3 cm short axis right paratracheal node on image 55/3 and a right infrahilar node measuring 1.1 cm short axis on image 85/3. No axillary adenopathy. The thyroid gland, trachea and esophagus demonstrate no significant findings. Lungs/Pleura: No pleural effusion or pneumothorax. Moderate centrilobular and paraseptal emphysema with scattered subpleural reticulation, architectural distortion and central airway thickening. Asymmetric dependent airspace disease at the right lung base suspicious for pneumonia, possibly on the basis of aspiration. Cavitary subpleural nodule peripherally in the right upper lobe measuring 1.8 x 1.7 cm  on image 54/4. This measures up to 2.1 cm on sagittal image 43/6. No other suspicious pulmonary nodules. Upper abdomen: No acute or suspicious findings are seen in the visualized upper abdomen. There is a stable right adrenal nodule measuring 2.5 cm and 8 HU consistent with an incidental lipid rich adrenal adenoma. No follow-up imaging recommended. Musculoskeletal/Chest wall: There is no chest wall mass or suspicious osseous finding. Unless specific follow-up recommendations are mentioned in the findings or impression sections, no imaging follow-up of any mentioned incidental findings is recommended. IMPRESSION: 1. Asymmetric dependent airspace disease at the right lung base suspicious for pneumonia, possibly on the basis of aspiration. 2. Cavitary subpleural nodule peripherally in the  right upper lobe measuring up to 2.1 cm. This is nonspecific and could reflect a small abscess, although is suspicious for bronchogenic carcinoma. Short-term CT follow-up (2-3 months) suggested, and if persistent recommend further evaluation with PET-CT. 3. Nonspecific mediastinal and hilar adenopathy, potentially reactive. 4. New mild cardiomegaly and a new small pericardial effusion. 5. 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 6. Stable incidental right adrenal adenoma. 7. Aortic Atherosclerosis (ICD10-I70.0) and Emphysema (ICD10-J43.9). Electronically Signed   By: Carey Bullocks M.D.   On: 03/04/2023 08:42   DG Chest Port 1 View  Result Date: 03/04/2023 CLINICAL DATA:  Chest pain. EXAM: PORTABLE CHEST 1 VIEW COMPARISON:  February 22, 2023 FINDINGS: The cardiac silhouette is moderately enlarged and stable in appearance. Mild to moderate severity diffuse, chronic appearing increased interstitial lung markings are seen. Mild areas of atelectasis and/or infiltrate are seen within the mid left lung and bilateral lung bases. No pleural effusion or pneumothorax is identified. The visualized skeletal structures are unremarkable. IMPRESSION: Chronic appearing increased interstitial lung markings with mild areas of atelectasis and/or infiltrate within the mid left lung and bilateral lung bases. Electronically Signed   By: Aram Candela M.D.   On: 03/04/2023 03:31      LOS: 0 days    Joycelyn Das, MD [facility] Available via Epic secure chat 7am-7pm After these hours, please refer to coverage provider listed on [email or web address] 03/04/2023, 1:35 PM

## 2023-03-04 NOTE — ED Triage Notes (Signed)
PER EMS:pt reports a gradual onset of central non-radiating chest pain/pressure that started about 5 hrs PTA (10pm). Hx of MI. He took 324 aspirin and received 0.4 nitroglycerin tabs x 2 and 6 mg Morphine IV by EMS. Initial room air sat was 91%. He was placed on 4L [place] and O2 increased to 96% on 4L [place].  BP-138/78, HR-95, cbg-133

## 2023-03-05 ENCOUNTER — Encounter (HOSPITAL_COMMUNITY): Payer: Self-pay | Admitting: Family Medicine

## 2023-03-05 ENCOUNTER — Inpatient Hospital Stay (HOSPITAL_COMMUNITY): Payer: Medicare Other | Admitting: Certified Registered Nurse Anesthetist

## 2023-03-05 ENCOUNTER — Inpatient Hospital Stay (HOSPITAL_COMMUNITY): Payer: Medicare Other

## 2023-03-05 ENCOUNTER — Encounter (HOSPITAL_COMMUNITY)
Admission: EM | Disposition: A | Discharge status: Home / Self Care | Payer: Self-pay | Source: Home / Self Care | Attending: Internal Medicine

## 2023-03-05 DIAGNOSIS — I5023 Acute on chronic systolic (congestive) heart failure: Secondary | ICD-10-CM

## 2023-03-05 DIAGNOSIS — I214 Non-ST elevation (NSTEMI) myocardial infarction: Secondary | ICD-10-CM

## 2023-03-05 DIAGNOSIS — Z87891 Personal history of nicotine dependence: Secondary | ICD-10-CM

## 2023-03-05 DIAGNOSIS — K259 Gastric ulcer, unspecified as acute or chronic, without hemorrhage or perforation: Secondary | ICD-10-CM

## 2023-03-05 DIAGNOSIS — I5033 Acute on chronic diastolic (congestive) heart failure: Secondary | ICD-10-CM

## 2023-03-05 DIAGNOSIS — N179 Acute kidney failure, unspecified: Secondary | ICD-10-CM

## 2023-03-05 DIAGNOSIS — I130 Hypertensive heart and chronic kidney disease with heart failure and stage 1 through stage 4 chronic kidney disease, or unspecified chronic kidney disease: Secondary | ICD-10-CM

## 2023-03-05 DIAGNOSIS — N183 Chronic kidney disease, stage 3 unspecified: Secondary | ICD-10-CM

## 2023-03-05 HISTORY — PX: ESOPHAGOGASTRODUODENOSCOPY (EGD) WITH PROPOFOL: SHX5813

## 2023-03-05 HISTORY — PX: BIOPSY: SHX5522

## 2023-03-05 LAB — TROPONIN I (HIGH SENSITIVITY): Troponin I (High Sensitivity): 2712 ng/L (ref <18)

## 2023-03-05 SURGERY — ESOPHAGOGASTRODUODENOSCOPY (EGD) WITH PROPOFOL
Anesthesia: Monitor Anesthesia Care

## 2023-03-05 MED ORDER — PROPOFOL 10 MG/ML IV BOLUS
INTRAVENOUS | Status: DC | PRN
  Administered 2023-03-05: 40 mg via INTRAVENOUS

## 2023-03-05 MED ORDER — CLOPIDOGREL BISULFATE 75 MG PO TABS
75.0000 mg | ORAL_TABLET | Freq: Every day | ORAL | Status: DC
  Administered 2023-03-05 – 2023-03-06 (×2): 75 mg via ORAL
  Filled 2023-03-05 (×2): qty 1

## 2023-03-05 MED ORDER — METOPROLOL SUCCINATE ER 25 MG PO TB24
25.0000 mg | ORAL_TABLET | Freq: Every day | ORAL | Status: DC
  Administered 2023-03-06: 25 mg via ORAL
  Filled 2023-03-05: qty 1

## 2023-03-05 MED ORDER — SODIUM CHLORIDE 0.9 % IV SOLN: INTRAVENOUS | Status: DC

## 2023-03-05 MED ORDER — PHENYLEPHRINE 80 MCG/ML (10ML) SYRINGE FOR IV PUSH (FOR BLOOD PRESSURE SUPPORT)
PREFILLED_SYRINGE | INTRAVENOUS | Status: DC | PRN
  Administered 2023-03-05 (×2): 160 ug via INTRAVENOUS

## 2023-03-05 MED ORDER — PROPOFOL 500 MG/50ML IV EMUL
INTRAVENOUS | Status: DC | PRN
  Administered 2023-03-05: 100 ug/kg/min via INTRAVENOUS

## 2023-03-05 SURGICAL SUPPLY — 15 items
BLOCK BITE 60FR ADLT L/F BLUE (MISCELLANEOUS) ×3 IMPLANT
ELECT REM PT RETURN 9FT ADLT (ELECTROSURGICAL)
ELECTRODE REM PT RTRN 9FT ADLT (ELECTROSURGICAL) IMPLANT
FORCEP RJ3 GP 1.8X160 W-NEEDLE (CUTTING FORCEPS) IMPLANT
FORCEPS BIOP RAD 4 LRG CAP 4 (CUTTING FORCEPS) IMPLANT
NDL SCLEROTHERAPY 25GX240 (NEEDLE) IMPLANT
NEEDLE SCLEROTHERAPY 25GX240 (NEEDLE)
PROBE APC STR FIRE (PROBE) IMPLANT
PROBE INJECTION GOLD (MISCELLANEOUS)
PROBE INJECTION GOLD 7FR (MISCELLANEOUS) IMPLANT
SNARE SHORT THROW 13M SML OVAL (MISCELLANEOUS) IMPLANT
SYR 50ML LL SCALE MARK (SYRINGE) IMPLANT
TUBING ENDO SMARTCAP PENTAX (MISCELLANEOUS) ×6 IMPLANT
TUBING IRRIGATION ENDOGATOR (MISCELLANEOUS) ×3 IMPLANT
WATER STERILE IRR 1000ML POUR (IV SOLUTION) IMPLANT

## 2023-03-05 NOTE — Progress Notes (Addendum)
Rounding Note    Patient Name: Jeffrey Norton Date of Encounter: 03/05/2023  [facility] Cardiologist: Nanetta Batty, MD   Subjective   BP 109/73.  Incomplete I/os.  Worsening renal function (creatinine 1.55 > 2.06).  Denies any further chest pain, reports dyspnea improved.  Inpatient Medications    Scheduled Meds:  aspirin EC  81 mg Oral Daily   heparin  5,000 Units Subcutaneous Q8H   sodium chloride flush  3 mL Intravenous Q12H   Continuous Infusions:  sodium chloride     PRN Meds: sodium chloride, acetaminophen, ondansetron (ZOFRAN) IV, sodium chloride flush   Vital Signs    Vitals:   03/04/23 1938 03/05/23 0034 03/05/23 0444 03/05/23 0738  BP:    109/73  Pulse:    81  Resp: 20 19 19 18   Temp: 98.3 F (36.8 C) 98 F (36.7 C) 98.1 F (36.7 C) 97.8 F (36.6 C)  TempSrc: Oral Oral Oral Oral  SpO2: 92%  90% 91%  Weight:   76.8 kg   Height:        Intake/Output Summary (Last 24 hours) at 03/05/2023 0919 Last data filed at 03/05/2023 0851 Gross per 24 hour  Intake 3 ml  Output 1575 ml  Net -1572 ml      03/05/2023    4:44 AM 03/04/2023    2:41 PM 03/04/2023    2:56 AM  Last 3 Weights  Weight (lbs) 169 lb 5 oz 169 lb 5 oz 187 lb  Weight (kg) 76.8 kg 76.8 kg 84.823 kg      Telemetry    NSR - Personally Reviewed  ECG    No new ECG - Personally Reviewed  Physical Exam   GEN: No acute distress.   Neck: mild JVD Cardiac: RRR, no murmurs, rubs, or gallops.  Respiratory: Clear to auscultation bilaterally. GI: Soft, nontender, non-distended  MS: No edema; No deformity. Neuro:  Nonfocal  Psych: Normal affect   Labs    High Sensitivity Troponin:   Recent Labs  Lab 02/22/23 0842 02/22/23 1150 03/04/23 0300 03/04/23 0450 03/05/23 0640  TROPONINIHS 5,714* 8,739* 3,937* 3,947* 2,712*     Chemistry Recent Labs  Lab 03/04/23 0300 03/05/23 0157  NA 133* 132*  K 4.8 4.3  CL 101 95*  CO2 24 24  GLUCOSE 141* 99  BUN 16 29*   CREATININE 1.55* 2.06*  CALCIUM 8.2* 8.0*  MG  --  2.1  GFRNONAA 48* 34*  ANIONGAP 8 13    Lipids No results for input(s): "CHOL", "TRIG", "HDL", "LABVLDL", "LDLCALC", "CHOLHDL" in the last 168 hours.  Hematology Recent Labs  Lab 03/04/23 0300 03/05/23 0157  WBC 12.2* 13.7*  RBC 4.07* 3.80*  HGB 12.4* 11.9*  HCT 39.2 36.5*  MCV 96.3 96.1  MCH 30.5 31.3  MCHC 31.6 32.6  RDW 14.3 14.5  PLT 361 366   Thyroid No results for input(s): "TSH", "FREET4" in the last 168 hours.  BNP Recent Labs  Lab 03/04/23 0300  BNP 395.6*    DDimer  Recent Labs  Lab 03/04/23 0728  DDIMER 1.56*     Radiology    CT CHEST WO CONTRAST  Result Date: 03/04/2023 CLINICAL DATA:  Hypoxia, acute respiratory failure. Chest pain and pressure. EXAM: CT CHEST WITHOUT CONTRAST TECHNIQUE: Multidetector CT imaging of the chest was performed following the standard protocol without IV contrast. RADIATION DOSE REDUCTION: This exam was performed according to the departmental dose-optimization program which includes automated exposure control, adjustment of the  mA and/or kV according to patient size and/or use of iterative reconstruction technique. COMPARISON:  No prior chest CT. Chest radiographs 03/04/2023 and 02/22/2023. Abdominal CTA 08/29/2012. FINDINGS: Cardiovascular: Atherosclerosis of the aorta, great vessels and coronary arteries. There are calcifications of the aortic valve. New mild cardiomegaly and a new small pericardial effusion. Mediastinum/Nodes: There are numerous prominent mediastinal and hilar lymph nodes, including a 1.3 cm short axis right paratracheal node on image 55/3 and a right infrahilar node measuring 1.1 cm short axis on image 85/3. No axillary adenopathy. The thyroid gland, trachea and esophagus demonstrate no significant findings. Lungs/Pleura: No pleural effusion or pneumothorax. Moderate centrilobular and paraseptal emphysema with scattered subpleural reticulation, architectural  distortion and central airway thickening. Asymmetric dependent airspace disease at the right lung base suspicious for pneumonia, possibly on the basis of aspiration. Cavitary subpleural nodule peripherally in the right upper lobe measuring 1.8 x 1.7 cm on image 54/4. This measures up to 2.1 cm on sagittal image 43/6. No other suspicious pulmonary nodules. Upper abdomen: No acute or suspicious findings are seen in the visualized upper abdomen. There is a stable right adrenal nodule measuring 2.5 cm and 8 HU consistent with an incidental lipid rich adrenal adenoma. No follow-up imaging recommended. Musculoskeletal/Chest wall: There is no chest wall mass or suspicious osseous finding. Unless specific follow-up recommendations are mentioned in the findings or impression sections, no imaging follow-up of any mentioned incidental findings is recommended. IMPRESSION: 1. Asymmetric dependent airspace disease at the right lung base suspicious for pneumonia, possibly on the basis of aspiration. 2. Cavitary subpleural nodule peripherally in the right upper lobe measuring up to 2.1 cm. This is nonspecific and could reflect a small abscess, although is suspicious for bronchogenic carcinoma. Short-term CT follow-up (2-3 months) suggested, and if persistent recommend further evaluation with PET-CT. 3. Nonspecific mediastinal and hilar adenopathy, potentially reactive. 4. New mild cardiomegaly and a new small pericardial effusion. 5. 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 6. Stable incidental right adrenal adenoma. 7. Aortic Atherosclerosis (ICD10-I70.0) and Emphysema (ICD10-J43.9). Electronically Signed   By: Carey Bullocks M.D.   On: 03/04/2023 08:42   DG Chest Port 1 View  Result Date: 03/04/2023 CLINICAL DATA:  Chest pain. EXAM: PORTABLE CHEST 1 VIEW COMPARISON:  February 22, 2023 FINDINGS: The cardiac silhouette is moderately enlarged and stable in appearance. Mild to moderate severity diffuse,  chronic appearing increased interstitial lung markings are seen. Mild areas of atelectasis and/or infiltrate are seen within the mid left lung and bilateral lung bases. No pleural effusion or pneumothorax is identified. The visualized skeletal structures are unremarkable. IMPRESSION: Chronic appearing increased interstitial lung markings with mild areas of atelectasis and/or infiltrate within the mid left lung and bilateral lung bases. Electronically Signed   By: Aram Candela M.D.   On: 03/04/2023 03:31    Cardiac Studies     Patient Profile     72 y.o. male with history of CAD status post multiple PCI's, PAD, atrial flutter, hypertension, prior tobacco use who we are consulted for evaluation of chest pain and troponin elevation   Assessment & Plan    NSTEMI: Admitted 02/22/2023 with inferior MI, cath showed totally occluded RCA, underwent balloon angioplasty.  Postprocedure was started on aspirin, Brilinta, and IV heparin.  Unfortunately developed hematemesis and melena.  He refused EGD at that time and left AMA.  He was not taking any antiplatelet medications on discharge.  Presented to ED with chest pain on 8/8.  Troponins downtrending (3937 >  3947 > 2712) -Troponins likely remain elevated from recent MI, but are downtrending suggesting acute chest pain is not consistent with ACS.  He is not a good cardiac catheterization candidate at this time anyways in setting of recent GI bleeding.  Have restarted aspirin 81 mg daily.  Holding off on Brilinta or anticoagulation for now pending GI evaluation.  GI planning EGD today -Update echocardiogram  ADDENDUM: echo shows moderate pericardial effusion, no evidence of tamponade.  Recommend repeat echo in 1 week to monitor effusion.  EGD showed gastritis with hemorrhage that was biopsied, duodenal erosions without bleeding.  Per GI okay to restart anticoagulation.  Recent GI bleed occurred in the setting of triple therapy after his MI. I discussed with  Mr. Avila and he is adamant about not going back on either Brilinta or Eliquis.   He is however agreeable to restarting Plavix, which he has been on previously and tolerated well.  Will restart Plavix and continue aspirin, and monitor for further bleeding.    Acute systolic heart failure: Echocardiogram 02/23/2023 showed EF 40 to 45%.  Ischemic cardiomyopathy and stenting of MI.  Appeared volume overloaded on presentation and was hypoxic with SpO2 down to 87%.  Diuresed with IV Lasix with improvement.  Worsening renal function this morning, will hold diuretics.  Will fold in GDMT as BP and renal function tolerates, start with toprol XL 25 mg daily               Acute respiratory failure: O2 saturation 87% on arrival, required 4 L nasal cannula, now back down to 2 L/min.  Diuresed with IV Lasix on admission, will hold further diuresis with worsening renal function today   Recent GI bleed: seen by GI during previous hospitalization, patient declined to consent for EGD.  He is now agreeable to proceed with EGD, GI planning procedure today   Elevated d-dimer: per primary team   Lung nodule: 2.1 cm lung nodule noted on CT of chest without contrast, suspicious for bronchogenic carcinoma.  Will defer workup to primary team.   History of atrial flutter: Diagnosed in December 2023, was initially on metoprolol and Eliquis, self discontinued Eliquis due to hematochezia.  Still taking metoprolol at home, no recurrence of atrial flutter.  Not a candidate for anticoagulation currently in setting of GI bleed  AKI: worsening renal function this morning (1.55>2.06).  Hold diuretics today    For questions or updates, please contact [facility] Please consult [email or web address] for contact info under        Signed, Little Ishikawa, MD  03/05/2023, 9:19 AM

## 2023-03-05 NOTE — Progress Notes (Signed)
PROGRESS NOTE    Jeffrey Norton  SAY:[id number] DOB: 1951/01/08 DOA: 03/04/2023 PCP: [name], MD    Brief Narrative:   Patient is a  72 year old male with past medical history of CAD recently admitted with STEMI 02/22/2023 status post LHC balloon angioplasty had developed hematemesis after being placed on heparin and aspirin.  At that time, GI was consulted and plans were to have a EGD but the patient had left AGAINST MEDICAL ADVICE.  He was however given Protonix twice daily.  After discharge patient never took the Protonix and his hematemesis resolved.  Patient previously admitted December 2023 with NSTEMI and atrial flutter.  He was prescribed medication but had not been taking it.  During this presentation, patient had  acute onset of central chest pain with mild shortness of breath.    In the ED, vitals were stable.  Troponin was elevated at 3947.   EKG without acute ischemic changes.  CXR with chronic appearing interstitial markings with mild areas of atelectasis and/or infiltrates in the left lung bases B/L.  Patient was noted to be hypoxic with a pulse ox of 87% on room air on presentation and required supplemental oxygen.  BNP was 395.6.  Cardiology was consulted and patient was then admitted to the hospital.  Assessment and plan.   Acute hypoxic respiratory failure Likely secondary to congestive heart failure.  Currently on 4 L of oxygen.   BNP elevated at 395.  Most recent 2D echocardiogram showed LV ejection fraction of 40 to 45%.  Cardiology on board at this time.  Received IV Lasix during hospitalization..  Wean oxygen as able.  None-ST elevation MI.  Had chest discomfort with elevated troponins.    History of ST elevation MI.  Status post cardiac catheterization on 02/22/2023 with coronary balloon angioplasty and thrombectomy.  He was recommended aspirin and Brilinta for minimum of 12 months.  Was not taking medications as outpatient.  EKG normal sinus rhythm.  Cardiology on  board and heparin has not been initiated due to previous history of hematemesis.  GI has been consulted to rule out upper GI lesions prior to committing to dual antiplatelets or anticoagulation.  Limited 2D echocardiogram suggests low EF at 40 to 45% with regional wall motion abnormality.  Follow cardiology recommendations.  Has been started on aspirin.  History of patient GI bleed.  Had declined EGD previous admission.  Self improved since he discontinued antiplatelets after leaving the hospital.  Now considering EGD again for potential need of dual antiplatelets.  Lung nodule.  2.1 cm on the CT scan of the chest without contrast suspicious for bronchogenic carcinoma.  Radiology recommend short-term CT follow-up in 2 to 3 months and if persistent recommend PET CT scan.  Will need to follow-up with pulmonary clinic on discharge.  History of atrial flutter.  Diagnosed December 2023.  Was initially on metoprolol and Eliquis but Eliquis discontinued due to GI bleed.  On metoprolol.  Has improved at this time.  Undergoing EGD today to decide on anticoagulants/ dual antiplatelets.  Mild hyponatremia.  Sodium of 132.  Will closely monitor.  HTN (hypertension) On metoprolol at home.  Currently on hold.  Hyperlipidemia Would likely benefit from statins on discharge.  CKD stage III.  Creatinine today at 2.1 from 1.5.  Continue to monitor BMP.  Had CHF and needed Lasix IV twice daily.  Hold further doses of Lasix.  Mild leukocytosis.  Could be reactive.  No overt signs of infection.   PVD. LSFA  PTA 08/15/12, residual Rt disease but minimal symptoms.    DVT prophylaxis: heparin injection 5,000 Units Start: 03/04/23 0600   Code Status:     Code Status: Full Code  Disposition: Home likely in 1 to 2 days, when okay with cardiology/GI.  Status is: Inpatient  Remains inpatient appropriate because: Non-ST elevation MI, further observation, cardiology follow-up, EGD evaluation.   Family Communication:   Spoke with the patient's spouse at bedside on 03/05/2023.  Consultants:  Cardiology GI  Procedures:  Plan for EGD 03/05/2023  Antimicrobials:  None  Anti-infectives (From admission, onward)    None      Subjective: Today, patient was seen and examined at bedside.  Patient's wife at bedside.  Denies any pain, dizziness, lightheadedness or shortness of breath.  Awaiting for EGD today.    Objective: Vitals:   03/05/23 0034 03/05/23 0444 03/05/23 0738 03/05/23 1112  BP:   109/73 (!) 145/71  Pulse:   81 90  Resp: 19 19 18  (!) 24  Temp: 98 F (36.7 C) 98.1 F (36.7 C) 97.8 F (36.6 C) (!) 97.3 F (36.3 C)  TempSrc: Oral Oral Oral Temporal  SpO2:  90% 91% 91%  Weight:  76.8 kg    Height:        Intake/Output Summary (Last 24 hours) at 03/05/2023 1243 Last data filed at 03/05/2023 0851 Gross per 24 hour  Intake 3 ml  Output 1325 ml  Net -1322 ml   Filed Weights   03/04/23 0256 03/04/23 1441 03/05/23 0444  Weight: 84.8 kg 76.8 kg 76.8 kg    Physical Examination: Body mass index is 25 kg/m.   General:  Average built, not in obvious distress, on nasal cannula oxygen HENT:   No scleral pallor or icterus noted. Oral mucosa is moist.  Chest:  Clear breath sounds.   No crackles or wheezes.  CVS: S1 &S2 heard. No murmur.  Regular rate and rhythm. Abdomen: Soft, nontender, nondistended.  Bowel sounds are heard.   Extremities: No cyanosis, clubbing or edema.  Peripheral pulses are palpable. Psych: Alert, awake and oriented, normal mood CNS:  No cranial nerve deficits.  Power equal in all extremities.   Skin: Warm and dry.  No rashes noted.  Data Reviewed:   CBC: Recent Labs  Lab 03/04/23 0300 03/05/23 0157  WBC 12.2* 13.7*  NEUTROABS 10.1* 10.6*  HGB 12.4* 11.9*  HCT 39.2 36.5*  MCV 96.3 96.1  PLT 361 366    Basic Metabolic Panel: Recent Labs  Lab 03/04/23 0300 03/05/23 0157  NA 133* 132*  K 4.8 4.3  CL 101 95*  CO2 24 24  GLUCOSE 141* 99  BUN 16 29*   CREATININE 1.55* 2.06*  CALCIUM 8.2* 8.0*  MG  --  2.1    Liver Function Tests: No results for input(s): "AST", "ALT", "ALKPHOS", "BILITOT", "PROT", "ALBUMIN" in the last 168 hours.   Radiology Studies: ECHOCARDIOGRAM LIMITED  Result Date: 03/05/2023    ECHOCARDIOGRAM LIMITED REPORT   Patient Name:   MAXFIELD OLBERA Date of Exam: 03/05/2023 Medical Rec #:  [id number]        Height:       69.0 in Accession #:    [id number]       Weight:       169.3 lb Date of Birth:  [birth year]        BSA:          1.925 m Patient Age:    44 years  BP:           109/73 mmHg Patient Gender: M                HR:           87 bpm. Exam Location:  Inpatient Procedure: Limited Echo, Cardiac Doppler, Limited Color Doppler, Strain Analysis            and 3D Echo Indications:    NSTEMI I21.4  History:        Patient has prior history of Echocardiogram examinations, most                 recent 02/22/2023. CHF, Previous Myocardial Infarction and CAD,                 Arrythmias:Atrial Fibrillation and Atrial Flutter; Risk                 Factors:Hypertension, Dyslipidemia and Former Smoker.  Sonographer:    Dondra Prader RVT RCS Referring Phys: [phone] CHRISTOPHER L SCHUMANN IMPRESSIONS  1. Inferior wall with severe hypokinesis. Left ventricular ejection fraction, by estimation, is 40 to 45%. The left ventricle has mildly decreased function. The left ventricle demonstrates regional wall motion abnormalities (see scoring diagram/findings  for description). The left ventricular internal cavity size was mildly dilated. There is mild asymmetric left ventricular hypertrophy.  2. Right ventricular systolic function is mildly reduced. The right ventricular size is mildly enlarged.  3. Moderate pericardial effusion now present posterior to LV. IVC is dilated up to 2.7 cm, but does collapse. RV is dilated with collapse. No signs of tamponade. Moderate pericardial effusion. The pericardial effusion is posterior to the left ventricle.  4.  The mitral valve is grossly normal. Mild mitral valve regurgitation. No evidence of mitral stenosis.  5. The inferior vena cava is dilated in size with >50% respiratory variability, suggesting right atrial pressure of 8 mmHg. Comparison(s): Changes from prior study are noted. EF unchanged. Inferior WMA more prominent. Moderate pericardial effusion predominantly posterior to the LV. No signs of tamponade. FINDINGS  Left Ventricle: Inferior wall with severe hypokinesis. Left ventricular ejection fraction, by estimation, is 40 to 45%. The left ventricle has mildly decreased function. The left ventricle demonstrates regional wall motion abnormalities. The left ventricular internal cavity size was mildly dilated. There is mild asymmetric left ventricular hypertrophy.  LV Wall Scoring: The entire inferior wall is hypokinetic. Right Ventricle: The right ventricular size is mildly enlarged. No increase in right ventricular wall thickness. Right ventricular systolic function is mildly reduced. Pericardium: Moderate pericardial effusion now present posterior to LV. IVC is dilated up to 2.7 cm, but does collapse. RV is dilated with collapse. No signs of tamponade. A moderately sized pericardial effusion is present. The pericardial effusion is posterior to the left ventricle. Mitral Valve: The mitral valve is grossly normal. Mild mitral valve regurgitation. No evidence of mitral valve stenosis. Venous: The inferior vena cava is dilated in size with greater than 50% respiratory variability, suggesting right atrial pressure of 8 mmHg. LEFT VENTRICLE PLAX 2D LVIDd:         6.00 cm LVIDs:         4.20 cm LV PW:         0.80 cm LV IVS:        1.30 cm                        3D Volume EF:  3D EF:        33 %                        LV EDV:       196 ml                        LV ESV:       131 ml                        LV SV:        65 ml IVC IVC diam: 2.70 cm LEFT ATRIUM         Index LA diam:    4.70 cm 2.44  cm/m Lennie Odor MD Electronically signed by Lennie Odor MD Signature Date/Time: 03/05/2023/10:36:58 AM    Final    CT CHEST WO CONTRAST  Result Date: 03/04/2023 CLINICAL DATA:  Hypoxia, acute respiratory failure. Chest pain and pressure. EXAM: CT CHEST WITHOUT CONTRAST TECHNIQUE: Multidetector CT imaging of the chest was performed following the standard protocol without IV contrast. RADIATION DOSE REDUCTION: This exam was performed according to the departmental dose-optimization program which includes automated exposure control, adjustment of the mA and/or kV according to patient size and/or use of iterative reconstruction technique. COMPARISON:  No prior chest CT. Chest radiographs 03/04/2023 and 02/22/2023. Abdominal CTA 08/29/2012. FINDINGS: Cardiovascular: Atherosclerosis of the aorta, great vessels and coronary arteries. There are calcifications of the aortic valve. New mild cardiomegaly and a new small pericardial effusion. Mediastinum/Nodes: There are numerous prominent mediastinal and hilar lymph nodes, including a 1.3 cm short axis right paratracheal node on image 55/3 and a right infrahilar node measuring 1.1 cm short axis on image 85/3. No axillary adenopathy. The thyroid gland, trachea and esophagus demonstrate no significant findings. Lungs/Pleura: No pleural effusion or pneumothorax. Moderate centrilobular and paraseptal emphysema with scattered subpleural reticulation, architectural distortion and central airway thickening. Asymmetric dependent airspace disease at the right lung base suspicious for pneumonia, possibly on the basis of aspiration. Cavitary subpleural nodule peripherally in the right upper lobe measuring 1.8 x 1.7 cm on image 54/4. This measures up to 2.1 cm on sagittal image 43/6. No other suspicious pulmonary nodules. Upper abdomen: No acute or suspicious findings are seen in the visualized upper abdomen. There is a stable right adrenal nodule measuring 2.5 cm and 8 HU  consistent with an incidental lipid rich adrenal adenoma. No follow-up imaging recommended. Musculoskeletal/Chest wall: There is no chest wall mass or suspicious osseous finding. Unless specific follow-up recommendations are mentioned in the findings or impression sections, no imaging follow-up of any mentioned incidental findings is recommended. IMPRESSION: 1. Asymmetric dependent airspace disease at the right lung base suspicious for pneumonia, possibly on the basis of aspiration. 2. Cavitary subpleural nodule peripherally in the right upper lobe measuring up to 2.1 cm. This is nonspecific and could reflect a small abscess, although is suspicious for bronchogenic carcinoma. Short-term CT follow-up (2-3 months) suggested, and if persistent recommend further evaluation with PET-CT. 3. Nonspecific mediastinal and hilar adenopathy, potentially reactive. 4. New mild cardiomegaly and a new small pericardial effusion. 5. 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 6. Stable incidental right adrenal adenoma. 7. Aortic Atherosclerosis (ICD10-I70.0) and Emphysema (ICD10-J43.9). Electronically Signed   By: Carey Bullocks M.D.   On: 03/04/2023 08:42   DG Chest Port 1 View  Result Date: 03/04/2023 CLINICAL DATA:  Chest pain. EXAM:  PORTABLE CHEST 1 VIEW COMPARISON:  February 22, 2023 FINDINGS: The cardiac silhouette is moderately enlarged and stable in appearance. Mild to moderate severity diffuse, chronic appearing increased interstitial lung markings are seen. Mild areas of atelectasis and/or infiltrate are seen within the mid left lung and bilateral lung bases. No pleural effusion or pneumothorax is identified. The visualized skeletal structures are unremarkable. IMPRESSION: Chronic appearing increased interstitial lung markings with mild areas of atelectasis and/or infiltrate within the mid left lung and bilateral lung bases. Electronically Signed   By: Aram Candela M.D.   On: 03/04/2023 03:31       LOS: 1 day    Joycelyn Das, MD [facility] Available via Epic secure chat 7am-7pm After these hours, please refer to coverage provider listed on [email or web address] 03/05/2023, 12:43 PM

## 2023-03-05 NOTE — Transfer of Care (Signed)
Immediate Anesthesia Transfer of Care Note  Patient: Jeffrey Norton  Procedure(s) Performed: ESOPHAGOGASTRODUODENOSCOPY (EGD) WITH PROPOFOL BIOPSY  Patient Location: Endoscopy Unit  Anesthesia Type:MAC  Level of Consciousness: drowsy and patient cooperative  Airway & Oxygen Therapy: Patient Spontanous Breathing and Patient connected to nasal cannula oxygen  Post-op Assessment: Report given to RN and Post -op Vital signs reviewed and stable  Post vital signs: Reviewed and stable  Last Vitals:  Vitals Value Taken Time  BP 90/60   Temp    Pulse 89 03/05/23 1306  Resp 25 03/05/23 1306  SpO2 91 % 03/05/23 1306  Vitals shown include unfiled device data.  Last Pain:  Vitals:   03/05/23 1112  TempSrc: Temporal  PainSc: 0-No pain         Complications: No notable events documented.

## 2023-03-05 NOTE — Plan of Care (Signed)
  Problem: Pain Managment: Goal: General experience of comfort will improve Outcome: Progressing   Problem: Safety: Goal: Ability to remain free from injury will improve Outcome: Progressing   Problem: Skin Integrity: Goal: Risk for impaired skin integrity will decrease Outcome: Progressing   

## 2023-03-05 NOTE — Anesthesia Preprocedure Evaluation (Signed)
Anesthesia Evaluation  Patient identified by MRN, date of birth, ID band Patient awake    Reviewed: Allergy & Precautions, NPO status , Patient's Chart, lab work & pertinent test results  Airway Mallampati: III  TM Distance: >3 FB Neck ROM: Full    Dental  (+) Dental Advisory Given, Partial Lower   Pulmonary former smoker   Pulmonary exam normal breath sounds clear to auscultation       Cardiovascular hypertension, (-) angina + CAD, + Past MI, + Cardiac Stents, + Peripheral Vascular Disease and +CHF  Normal cardiovascular exam Rhythm:Regular Rate:Normal     Neuro/Psych negative neurological ROS     GI/Hepatic negative GI ROS, Neg liver ROS,,,  Endo/Other  negative endocrine ROS    Renal/GU Renal InsufficiencyRenal disease     Musculoskeletal negative musculoskeletal ROS (+)    Abdominal   Peds  Hematology  (+) Blood dyscrasia, anemia   Anesthesia Other Findings Day of surgery medications reviewed with the patient.  Reproductive/Obstetrics                             Anesthesia Physical Anesthesia Plan  ASA: 3  Anesthesia Plan: MAC   Post-op Pain Management: Minimal or no pain anticipated   Induction: Intravenous  PONV Risk Score and Plan: 1 and TIVA and Treatment may vary due to age or medical condition  Airway Management Planned: Natural Airway and Simple Face Mask  Additional Equipment:   Intra-op Plan:   Post-operative Plan:   Informed Consent: I have reviewed the patients History and Physical, chart, labs and discussed the procedure including the risks, benefits and alternatives for the proposed anesthesia with the patient or authorized representative who has indicated his/her understanding and acceptance.     Dental advisory given  Plan Discussed with: CRNA and Anesthesiologist  Anesthesia Plan Comments:        Anesthesia Quick Evaluation

## 2023-03-05 NOTE — Op Note (Signed)
[facility] Patient Name: Jeffrey Norton Procedure Date : 03/05/2023 MRN: [id number] Attending MD: Jeani Hawking , MD, [id number] Date of Birth: 04-27-1951 CSN: [id number] Age: 72 Admit Type: Inpatient Procedure:                Upper GI endoscopy Indications:              Iron deficiency anemia, Hematemesis, Melena Providers:                Jeani Hawking, MD, Doristine Mango, RN, Priscella Mann, Technician Referring MD:              Medicines:                Propofol per Anesthesia Complications:            No immediate complications. Estimated Blood Loss:     Estimated blood loss: none. Procedure:                Pre-Anesthesia Assessment:                           - Prior to the procedure, a History and Physical                            was performed, and patient medications and                            allergies were reviewed. The patient's tolerance of                            previous anesthesia was also reviewed. The risks                            and benefits of the procedure and the sedation                            options and risks were discussed with the patient.                            All questions were answered, and informed consent                            was obtained. Prior Anticoagulants: The patient has                            taken no anticoagulant or antiplatelet agents. ASA                            Grade Assessment: III - A patient with severe                            systemic disease. After reviewing the risks and  benefits, the patient was deemed in satisfactory                            condition to undergo the procedure.                           - Sedation was administered by an anesthesia                            professional. Deep sedation was attained.                           After obtaining informed consent, the endoscope was                            passed under  direct vision. Throughout the                            procedure, the patient's blood pressure, pulse, and                            oxygen saturations were monitored continuously. The                            [id number]-[id number] ([id number]) Olympus endoscope was introduced                            through the mouth, and advanced to the second part                            of duodenum. The upper GI endoscopy was                            accomplished without difficulty. The patient                            tolerated the procedure well. Scope In: Scope Out: Findings:      The esophagus was normal.      Diffuse moderate inflammation with hemorrhage characterized by erosions       and granularity was found in the gastric body and in the gastric antrum.       Biopsies were taken with a cold forceps for Helicobacter pylori testing.      Multiple diffuse erosions without bleeding were found in the duodenal       bulb and in the second portion of the duodenum.      In the gastric lumen there was evidence of erythema and some hematin.       Cold biopsies were obtained to check for H. pylori. In the duodenal bulb       and second portion of the duodenum, multiple superficial ulcerations       were identified. Impression:               - Normal esophagus.                           -  Gastritis with hemorrhage. Biopsied.                           - Duodenal erosions without bleeding. Recommendation:           - Return patient to hospital ward for ongoing care.                           - Resume regular diet.                           - Continue present medications.                           - PPI QD.                           - Okay to start anticoagulation as the risks of                            being off of the treatment is higher than the risk                            for bleeding.                           - Follow HGB and transfuse if necessary.                           - Await biopsy  results. Procedure Code(s):        --- Professional ---                           [phone], Esophagogastroduodenoscopy, flexible,                            transoral; with biopsy, single or multiple Diagnosis Code(s):        --- Professional ---                           K29.71, Gastritis, unspecified, with bleeding                           K26.9, Duodenal ulcer, unspecified as acute or                            chronic, without hemorrhage or perforation                           D50.9, Iron deficiency anemia, unspecified                           K92.0, Hematemesis                           K92.1, Melena (includes Hematochezia) CPT copyright 2022 American [employer] Association. All rights reserved. The codes documented in this report are preliminary and upon coder  review may  be revised to meet current compliance requirements. Jeani Hawking, MD Jeani Hawking, MD 03/05/2023 1:09:29 PM This report has been signed electronically. Number of Addenda: 0

## 2023-03-05 NOTE — Progress Notes (Signed)
Pt returned to room 3E14 after EGD. Received report from [place], RN in endo.

## 2023-03-05 NOTE — Progress Notes (Signed)
*  PRELIMINARY RESULTS* Echocardiogram 2D Echocardiogram has been performed.  Jeffrey Norton  03/05/2023, 10:05 AM

## 2023-03-05 NOTE — H&P (Addendum)
PCP:   [name], MD   Chief Complaint:  Chest pain shortness of breath  HPI: This is a 72 year old male with past medical history of CAD recently admitted with STEMI 02/22/2023.  Patient had a LHC balloon angioplasty done.  Patient developed hematemesis after being placed on heparin, Brilinta, and aspirin.  GI consulted, plans for EGD underway however patient left AMA.  He was discharged on Protonix twice daily.  After discharge patient has not taken Protonix however his hematemesis resolved.  Patient previously admitted December 2023 with NSTEMI and atrial flutter.  After discharge patient patient declined to take any prescribed medication.  Per patient he was discharged with Eliquis and noted blood in the stool.  All medication were discontinued.  Patient states he is not inclined towards pharmaceuticals because of there are side effects.  His wife treats him with several herbal medications including cayenne pepper and Hawthorne berry. Last night around 10 PM he developed severe central located chest pain with no radiation.  He describes the pain as sharp, associate with mild shortness of breath.  It was persistent and constant, resolving after he arrived in the ER.  In the ER patient's troponin was 3,947.  However, this is down from 8, 739 on 02/22/2023.  EKG without acute ischemic changes.  Patient presenting vitals BP 153/83, HR 96, RR 26, afebrile.  Patient hypoxic needing 4 L oxygen.  CXR with chronic appearing interstitial markings with mild areas of atelectasis and/or infiltrates in the left lung bases B/L.  BNP 395.6.  Cardiology on-call Dr. Paulino Rily consulted suspect CHF.  Recommended trending troponins and holding heparin given history of GI bleed.  Review of Systems:  Per HPI  Past Medical History: Past Medical History:  Diagnosis Date   Coronary artery disease    Hyperlipidemia    Hypertension    Myocardial infarction [facility])    PVD (peripheral vascular disease) (HCC)    S/P  angioplasty with stent to Lt. iliac 08/15/12 08/15/2012   Past Surgical History:  Procedure Laterality Date   2D Echocardiogram  08/25/2004   EF >55%, LA-moderately dilated.   CARDIAC CATHETERIZATION  03/11/2002   RCA subtotally occluded with TIMI-1 to1/2 flow stented with a 3x23 Cypher DES resulting in reduction of occlusion to 0% with TIMI-3 flow   CORONARY BALLOON ANGIOPLASTY N/A 02/22/2023   Procedure: CORONARY BALLOON ANGIOPLASTY;  Surgeon: Corky Crafts, MD;  Location: [facility] INVASIVE CV LAB;  Service: Cardiovascular;  Laterality: N/A;   CORONARY THROMBECTOMY N/A 02/22/2023   Procedure: Coronary Thrombectomy;  Surgeon: Corky Crafts, MD;  Location: [facility] INVASIVE CV LAB;  Service: Cardiovascular;  Laterality: N/A;   LEA Doppler  08/29/2012   L SFA Proximal: 70-99% diameter reduction. Velocities suggest upper end of range-new finding when compared to previous study. L EIA stent: opne and patent without evidence of restenosis   LEFT HEART CATH AND CORONARY ANGIOGRAPHY N/A 07/07/2022   Procedure: LEFT HEART CATH AND CORONARY ANGIOGRAPHY;  Surgeon: [name], Peter M, MD;  Location: [facility] INVASIVE CV LAB;  Service: Cardiovascular;  Laterality: N/A;   LEFT HEART CATH AND CORONARY ANGIOGRAPHY N/A 02/22/2023   Procedure: LEFT HEART CATH AND CORONARY ANGIOGRAPHY;  Surgeon: Corky Crafts, MD;  Location: [facility] INVASIVE CV LAB;  Service: Cardiovascular;  Laterality: N/A;   Lexiscan Myoview  07/19/2012   No significant ECG changes, mild inferobasal hypokinesis with normal LV function, low risk stress nuclear study   PV Intervention  08/15/2012   Occluded L External Iliac-stented with a  10x86mm Abbott Nitinol Absolute Pro self-expanding stent. 75% stenosis in L Common Femoral artery stented witha 8x52mm lon Absolute Nitinol Absolute Po self-expanding stent.    Medications: Prior to Admission medications   Medication Sig Start Date End Date Taking? Authorizing Provider  ASCORBIC ACID PO Take 1 tablet  by mouth daily. Vitamin C, unknown strength.    [provider]  aspirin EC 325 MG tablet Take 1 tablet (325 mg total) by mouth daily. 02/23/23 03/25/23  Aundra Dubin, MD  b complex vitamins capsule Take 1 capsule by mouth daily.    [provider]  Cholecalciferol (VITAMIN D-3 PO) Take 1 capsule by mouth daily.    [provider]  CVS TRIPLE MAGNESIUM COMPLEX PO Take 1 tablet by mouth daily.    [provider]  nitroGLYCERIN (NITROSTAT) 0.4 MG SL tablet Place 1 tablet (0.4 mg total) under the tongue every 5 (five) minutes as needed for chest pain. 07/08/22   Arty Baumgartner, NP  pantoprazole (PROTONIX) 40 MG tablet Take 1 tablet (40 mg total) by mouth 2 (two) times daily. 02/23/23 02/23/24  Aundra Dubin, MD    Allergies:  No Known Allergies  Social History:  reports that he quit smoking about 24 years ago. His smoking use included cigarettes. He has never used smokeless tobacco. He reports that he does not drink alcohol and does not use drugs.  Family History: Family History  Problem Relation Age of Onset   Diabetes Mother    Heart disease Mother 11   Hyperlipidemia Mother    Kidney disease Mother    Heart disease Father 24   Cancer Father 24   Stroke Maternal Grandfather        Mid 55s   Lung disease Paternal Grandfather        Black Lung Disease    Physical Exam: Vitals:   03/04/23 1300 03/04/23 1441 03/04/23 1938 03/05/23 0034  BP: 131/72 136/72    Pulse: 83 91    Resp: 18 20 20 19   Temp:  98.4 F (36.9 C) 98.3 F (36.8 C) 98 F (36.7 C)  TempSrc:  Oral Oral Oral  SpO2: 93% 92% 92%   Weight:  76.8 kg    Height:  5\' 9"  (1.753 m)      General:  Alert and oriented times three, well developed and nourished, no acute distress Eyes: Pnk conjunctiva, no scleral icterus ENT: Moist oral mucosa, neck supple, +1 JVD Lungs: clear to ascultation, no wheeze, no crackles, no use of accessory muscles Cardiovascular: RRR, no  regurgitation, no gallops, no murmurs. No carotid bruits, no JVD Abdomen: soft, positive BS, non-tender, non-distended, no organomegaly, not an acute abdomen GU: not examined Neuro: CN II - XII grossly intact, sensation intact Musculoskeletal: strength 5/5 all extremities, no edema Skin: no rash, no subcutaneous crepitation, no decubitus Psych: appropriate patient   Labs on Admission:  Recent Labs    03/04/23 0300  NA 133*  K 4.8  CL 101  CO2 24  GLUCOSE 141*  BUN 16  CREATININE 1.55*  CALCIUM 8.2*    Recent Labs    03/04/23 0300  WBC 12.2*  NEUTROABS 10.1*  HGB 12.4*  HCT 39.2  MCV 96.3  PLT 361    Micro Results: No results found for this or any previous visit (from the past 240 hour(s)).    Radiological Exams on Admission: CT CHEST WO CONTRAST  Result Date: 03/04/2023 CLINICAL DATA:  Hypoxia, acute respiratory failure. Chest pain and  pressure. EXAM: CT CHEST WITHOUT CONTRAST TECHNIQUE: Multidetector CT imaging of the chest was performed following the standard protocol without IV contrast. RADIATION DOSE REDUCTION: This exam was performed according to the departmental dose-optimization program which includes automated exposure control, adjustment of the mA and/or kV according to patient size and/or use of iterative reconstruction technique. COMPARISON:  No prior chest CT. Chest radiographs 03/04/2023 and 02/22/2023. Abdominal CTA 08/29/2012. FINDINGS: Cardiovascular: Atherosclerosis of the aorta, great vessels and coronary arteries. There are calcifications of the aortic valve. New mild cardiomegaly and a new small pericardial effusion. Mediastinum/Nodes: There are numerous prominent mediastinal and hilar lymph nodes, including a 1.3 cm short axis right paratracheal node on image 55/3 and a right infrahilar node measuring 1.1 cm short axis on image 85/3. No axillary adenopathy. The thyroid gland, trachea and esophagus demonstrate no significant findings. Lungs/Pleura: No  pleural effusion or pneumothorax. Moderate centrilobular and paraseptal emphysema with scattered subpleural reticulation, architectural distortion and central airway thickening. Asymmetric dependent airspace disease at the right lung base suspicious for pneumonia, possibly on the basis of aspiration. Cavitary subpleural nodule peripherally in the right upper lobe measuring 1.8 x 1.7 cm on image 54/4. This measures up to 2.1 cm on sagittal image 43/6. No other suspicious pulmonary nodules. Upper abdomen: No acute or suspicious findings are seen in the visualized upper abdomen. There is a stable right adrenal nodule measuring 2.5 cm and 8 HU consistent with an incidental lipid rich adrenal adenoma. No follow-up imaging recommended. Musculoskeletal/Chest wall: There is no chest wall mass or suspicious osseous finding. Unless specific follow-up recommendations are mentioned in the findings or impression sections, no imaging follow-up of any mentioned incidental findings is recommended. IMPRESSION: 1. Asymmetric dependent airspace disease at the right lung base suspicious for pneumonia, possibly on the basis of aspiration. 2. Cavitary subpleural nodule peripherally in the right upper lobe measuring up to 2.1 cm. This is nonspecific and could reflect a small abscess, although is suspicious for bronchogenic carcinoma. Short-term CT follow-up (2-3 months) suggested, and if persistent recommend further evaluation with PET-CT. 3. Nonspecific mediastinal and hilar adenopathy, potentially reactive. 4. New mild cardiomegaly and a new small pericardial effusion. 5. 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 6. Stable incidental right adrenal adenoma. 7. Aortic Atherosclerosis (ICD10-I70.0) and Emphysema (ICD10-J43.9). Electronically Signed   By: Carey Bullocks M.D.   On: 03/04/2023 08:42   DG Chest Port 1 View  Result Date: 03/04/2023 CLINICAL DATA:  Chest pain. EXAM: PORTABLE CHEST 1 VIEW COMPARISON:   February 22, 2023 FINDINGS: The cardiac silhouette is moderately enlarged and stable in appearance. Mild to moderate severity diffuse, chronic appearing increased interstitial lung markings are seen. Mild areas of atelectasis and/or infiltrate are seen within the mid left lung and bilateral lung bases. No pleural effusion or pneumothorax is identified. The visualized skeletal structures are unremarkable. IMPRESSION: Chronic appearing increased interstitial lung markings with mild areas of atelectasis and/or infiltrate within the mid left lung and bilateral lung bases. Electronically Signed   By: Aram Candela M.D.   On: 03/04/2023 03:31    Assessment/Plan Present on Admission:  Acute respiratory failure with hypoxia -Differential diagnosis includes CHF vs pneumonia.  Currently inconclusive -CHF order set initiated, low-dose Lasix.  Stict I/O's -Patient appears relatively euvolemic with mild JVD. -CT chest without contrast will be ordered to better differentiate.  D-dimer ordered r/o PE -Continue oxygen   Chest pain  STEMI 02/22/2023 / NSTEMI December 2023  Medical noncompliance -Elevated troponin but unclear if  NSTEMI. In the ER patient's troponin was 3,947. Down from 8,739 on 7/29/202.  Trending -Cardiology on-call Dr. Paulino Rily consulted.  Recommended trending troponins and holding heparin given history of GI bleed.  Formal consult in a.m. -Patient's most recent echo showed EF of 40-45% -Patient reluctant to take pharmaceutical.  Currently pain-free, resolving after he arrived in the ER.  -Defer to a.m. team/cardiology continued education and reinstitution of needed medications.  History of GI bleed -Hematemesis after being placed on heparin, Brilinta, and aspirin 02/22/23 -Patient discharged with Eliquis and noted blood in the stool. December 2023  -Patient declined Protonix and EGD. Continued education needed.   Atrial fibrillation (HCC) - EKG NSR.  HTN (hypertension) - would benefit from  ACEI/B Blocker  Hyperlipidemia - would benefit from statin  CKD stage III -stable at baseline  Medical noncompliance -Patient not inclined to taking prescription medication.  For now patient's SBP in the 160s. EKG NSR.  Patient and family.  He needs time and education on the benefits and side effects of medication ordered.  Approximately 30 minutes spent in discussion.  For now all medications not resumed.   -Defer to a.m. team/cardiology continued education and reinstitution of needed medications.   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

## 2023-03-05 NOTE — Plan of Care (Signed)
  Problem: Health Behavior/Discharge Planning: Goal: Ability to safely manage health-related needs after discharge will improve Outcome: Progressing   

## 2023-03-06 ENCOUNTER — Other Ambulatory Visit: Payer: Self-pay | Admitting: Student

## 2023-03-06 ENCOUNTER — Telehealth: Payer: Self-pay | Admitting: Student

## 2023-03-06 DIAGNOSIS — Z79899 Other long term (current) drug therapy: Secondary | ICD-10-CM

## 2023-03-06 DIAGNOSIS — Z8679 Personal history of other diseases of the circulatory system: Secondary | ICD-10-CM

## 2023-03-06 DIAGNOSIS — I3139 Other pericardial effusion (noninflammatory): Secondary | ICD-10-CM

## 2023-03-06 DIAGNOSIS — I480 Paroxysmal atrial fibrillation: Secondary | ICD-10-CM

## 2023-03-06 DIAGNOSIS — R911 Solitary pulmonary nodule: Secondary | ICD-10-CM

## 2023-03-06 DIAGNOSIS — I309 Acute pericarditis, unspecified: Secondary | ICD-10-CM

## 2023-03-06 DIAGNOSIS — N183 Chronic kidney disease, stage 3 unspecified: Secondary | ICD-10-CM

## 2023-03-06 DIAGNOSIS — D500 Iron deficiency anemia secondary to blood loss (chronic): Secondary | ICD-10-CM

## 2023-03-06 DIAGNOSIS — I255 Ischemic cardiomyopathy: Secondary | ICD-10-CM

## 2023-03-06 LAB — C-REACTIVE PROTEIN: CRP: 24.8 mg/dL — ABNORMAL HIGH (ref <1.0)

## 2023-03-06 LAB — SEDIMENTATION RATE: Sed Rate: 108 mm/hr — ABNORMAL HIGH (ref 0–16)

## 2023-03-06 MED ORDER — FUROSEMIDE 40 MG PO TABS: 40.0000 mg | ORAL_TABLET | Freq: Every day | ORAL | 0 refills | Status: DC

## 2023-03-06 MED ORDER — METOPROLOL SUCCINATE ER 25 MG PO TB24: 25.0000 mg | ORAL_TABLET | Freq: Every day | ORAL | 2 refills | Status: DC

## 2023-03-06 MED ORDER — ALBUTEROL SULFATE (2.5 MG/3ML) 0.083% IN NEBU: 2.5000 mg | INHALATION_SOLUTION | Freq: Four times a day (QID) | RESPIRATORY_TRACT | Status: DC | PRN

## 2023-03-06 MED ORDER — COLCHICINE 0.6 MG PO TABS: 0.6000 mg | ORAL_TABLET | Freq: Every day | ORAL | 2 refills | Status: DC

## 2023-03-06 MED ORDER — PANTOPRAZOLE SODIUM 40 MG PO TBEC: 40.0000 mg | DELAYED_RELEASE_TABLET | Freq: Every day | ORAL | 2 refills | Status: DC

## 2023-03-06 MED ORDER — CLOPIDOGREL BISULFATE 75 MG PO TABS: 75.0000 mg | ORAL_TABLET | Freq: Every day | ORAL | 2 refills | Status: DC

## 2023-03-06 NOTE — Progress Notes (Addendum)
Ambulated patient in hall on 2L [place] for 200 feet. SpO2 92%, heart rate 75.Patient denies shortness of breath, no DOE. Ambulated patient on room air 200 feet SpO2 dropped and remained at 80%, heart rate 94. He denies  shortness of breath but states he felt he could breath better while ambulating with oxygen. Post ambulation recovery sitting in bed on 2L [place] SpO2 96% heart rate 76. Dr. Rebekah [place] Pokhrel notified and he gave verbal order to notify [facility] for DME and send patient home on 2L [place]. Oliver Barre, RN

## 2023-03-06 NOTE — Progress Notes (Signed)
SATURATION QUALIFICATIONS: (This note is used to comply with regulatory documentation for home oxygen)  Patient Saturations on Room Air at Rest = 87%  Patient Saturations on Room Air while Ambulating = 80%  Patient Saturations on 2 Liters of oxygen while Ambulating = 92%  Please briefly explain why patient needs home oxygen: Desaturation of oxygen while on room air with significant drop on SpO2 on room air with ambulation. Current hospitalization of acute respiratory failure secondary to acute systolic heart failure.

## 2023-03-06 NOTE — Discharge Summary (Signed)
Physician Discharge Summary  Jeffrey Norton:[id number] DOB: [birth year] DOA: 03/04/2023  PCP: [name], MD  Admit date: 03/04/2023 Discharge date: 03/06/2023  Admitted From: Home  Discharge disposition: Home   Recommendations for Outpatient Follow-Up:   Follow up with your primary care provider in one week.  Patient will need to follow-up with cardiology as outpatient in 2 to 3 weeks.. Check CBC, BMP, magnesium in the next visit Patient with right upper lobe pulmonary nodule.  Radiology recommend short-term CT follow-up in 2 to 3 months and if persistent recommend PET CT scan.  Pulmonary nodule clinic referral has been made.   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  Diet recommendation: Low sodium, heart healthy.    Wound care: None.  Code status: Full.   History of Present Illness:   Patient is a  72 year old male with past medical history of CAD recently admitted with STEMI 02/22/2023 status post LHC balloon angioplasty had developed hematemesis after being placed on heparin and aspirin.  At that time, GI was consulted and plans were to have a EGD but the patient had left AGAINST MEDICAL ADVICE.  He was however given Protonix twice daily.  After discharge patient never took the Protonix and his hematemesis resolved.  Patient previously admitted December 2023 with NSTEMI and atrial flutter.  He was prescribed medication but had not been taking it.  During this presentation, patient had  acute onset of central chest pain with mild shortness of breath.  In the ED, vitals were stable.  Troponin was elevated at 3947.   EKG without acute  ischemic changes.  CXR with chronic appearing interstitial markings with mild areas of atelectasis and/or infiltrates in the left lung bases B/L.  Patient was noted to be hypoxic with a pulse ox of 87% on room air on presentation and required supplemental oxygen.  BNP was 395.6.  Cardiology was consulted and patient was then admitted to the hospital.   Hospital Course:   Following conditions were addressed during hospitalization as listed below,  Acute hypoxic respiratory failure Likely secondary to congestive heart failure.  Currently on 2 L of oxygen.  Has qualified for home oxygen at this time and will be discharged home on 2 L of oxygen.  BNP elevated at 395 on presentation.  Most recent 2D echocardiogram showed LV ejection fraction of 40 to 45%.  Cardiology saw the patient during hospitalization and received IV Lasix .   None-ST elevation MI.   Patient had chest discomfort with elevated troponins.    History of ST elevation MI.  Status post cardiac catheterization on 02/22/2023 with coronary balloon angioplasty and thrombectomy.  He was recommended aspirin and Brilinta for minimum of 12 months but was not taking medications due to GI bleed.  EKG normal sinus rhythm.  Cardiology followed the patient and underwent limited 2D echocardiogram showed LV EF at 40 to 45% with regional wall motion abnormality.  GI was consulted to rule out upper GI lesions previous history of hematemesis on dual antiplatelets..  Patient then underwent EGD on 03/05/2023 with findings of some upper GI erosions, gastritis and esophagitis..  GI is okay regarding  anticoagulation and at this time patient has opted to go with aspirin and Plavix.  Will continue PPI on discharge.   History of patient GI bleed.  Had declined EGD previous admission.  Status post EGD.  Will continue PPI on discharge.  Right upper lobe cavitary lung nodule.  2.1 cm on the CT scan of the chest without contrast suspicious for bronchogenic carcinoma.   Radiology recommend short-term CT follow-up in 2 to 3 months and if persistent recommend PET CT scan.  Referral has been made for follow-up in nodule clinic.  History of atrial flutter.  Diagnosed December 2023.  Was initially on metoprolol and Eliquis but Eliquis discontinued due to GI bleed.  Now on dual antiplatelets.  Will continue metoprolol on discharge.  Follow-up with cardiology as outpatient.   Mild hyponatremia.  Sodium of 133 prior to discharge.   HTN (hypertension) On metoprolol at home, will be changed to long-acting metoprolol 25 mg on discharge.   Hyperlipidemia Would likely benefit from statins as outpatient.   CKD stage III.  Creatinine today at 1.8 from 2.1.  Received the Lasix initially which was subsequently discontinued due to elevated creatinine.  Will need to follow-up BMP as outpatient.   Mild leukocytosis.  Could be reactive.  No overt signs of infection.  Check CBC in the next visit.    PVD. LSFA PTA 08/15/12, residual Rt disease but minimal symptoms.  Patient will continue Plavix on discharge.  Disposition.  At this time, patient is stable for disposition with outpatient PCP cardiology follow-up.  Spoke with the patient's wife at bedside.  Medical Consultants:   Cardiology GI  Procedures:    Upper GI endoscopy Subjective:   Today, patient was seen and examined at bedside.  Patient's spouse at bedside.  Denies any chest pain, shortness of breath, nausea, vomiting, abdominal pain.  Wishes to go home.  Has qualified for home oxygen.  Discharge Exam:   Vitals:   03/06/23 1111 03/06/23 1130  BP:  128/75  Pulse: 69 77  Resp:  20  Temp:  98.1 F (36.7 C)  SpO2: 96% 94%   Vitals:   03/06/23 1000 03/06/23 1045 03/06/23 1111 03/06/23 1130  BP:    128/75  Pulse: 72  69 77  Resp: 20   20  Temp:    98.1 F (36.7 C)  TempSrc:    Oral  SpO2: 92% (!) 80% 96% 94%  Weight:      Height:        General: Alert awake, not in obvious distress, on nasal  cannula oxygen HENT: pupils equally reacting to light,  No scleral pallor or icterus noted. Oral mucosa is moist.  Chest:    Diminished breath sounds bilaterally. No crackles or wheezes.  CVS: S1 &S2 heard. No murmur.  Regular rate and rhythm. Abdomen: Soft, nontender, nondistended.  Bowel sounds are heard.   Extremities: No cyanosis, clubbing or edema.  Peripheral pulses are palpable. Psych: Alert, awake and oriented, normal mood CNS:  No cranial nerve deficits.  Power equal in all extremities.   Skin: Warm and dry.  No rashes noted.  The results of significant diagnostics from this hospitalization (including imaging, microbiology, ancillary and laboratory) are listed below for reference.     Diagnostic Studies:   ECHOCARDIOGRAM LIMITED  Result Date: 03/05/2023    ECHOCARDIOGRAM LIMITED REPORT   Patient Name:   KHANE PIERPONT Date of Exam: 03/05/2023 Medical Rec #:  [id number]        Height:  69.0 in Accession #:    [id number]       Weight:       169.3 lb Date of Birth:  10/07/1950        BSA:          1.925 m Patient Age:    71 years         BP:           109/73 mmHg Patient Gender: M                HR:           87 bpm. Exam Location:  Inpatient Procedure: Limited Echo, Cardiac Doppler, Limited Color Doppler, Strain Analysis            and 3D Echo Indications:    NSTEMI I21.4  History:        Patient has prior history of Echocardiogram examinations, most                 recent 02/22/2023. CHF, Previous Myocardial Infarction and CAD,                 Arrythmias:Atrial Fibrillation and Atrial Flutter; Risk                 Factors:Hypertension, Dyslipidemia and Former Smoker.  Sonographer:    Dondra Prader RVT RCS Referring Phys: [phone] CHRISTOPHER L SCHUMANN IMPRESSIONS  1. Inferior wall with severe hypokinesis. Left ventricular ejection fraction, by estimation, is 40 to 45%. The left ventricle has mildly decreased function. The left ventricle demonstrates regional wall motion abnormalities (see  scoring diagram/findings  for description). The left ventricular internal cavity size was mildly dilated. There is mild asymmetric left ventricular hypertrophy.  2. Right ventricular systolic function is mildly reduced. The right ventricular size is mildly enlarged.  3. Moderate pericardial effusion now present posterior to LV. IVC is dilated up to 2.7 cm, but does collapse. RV is dilated with collapse. No signs of tamponade. Moderate pericardial effusion. The pericardial effusion is posterior to the left ventricle.  4. The mitral valve is grossly normal. Mild mitral valve regurgitation. No evidence of mitral stenosis.  5. The inferior vena cava is dilated in size with >50% respiratory variability, suggesting right atrial pressure of 8 mmHg. Comparison(s): Changes from prior study are noted. EF unchanged. Inferior WMA more prominent. Moderate pericardial effusion predominantly posterior to the LV. No signs of tamponade. FINDINGS  Left Ventricle: Inferior wall with severe hypokinesis. Left ventricular ejection fraction, by estimation, is 40 to 45%. The left ventricle has mildly decreased function. The left ventricle demonstrates regional wall motion abnormalities. The left ventricular internal cavity size was mildly dilated. There is mild asymmetric left ventricular hypertrophy.  LV Wall Scoring: The entire inferior wall is hypokinetic. Right Ventricle: The right ventricular size is mildly enlarged. No increase in right ventricular wall thickness. Right ventricular systolic function is mildly reduced. Pericardium: Moderate pericardial effusion now present posterior to LV. IVC is dilated up to 2.7 cm, but does collapse. RV is dilated with collapse. No signs of tamponade. A moderately sized pericardial effusion is present. The pericardial effusion is posterior to the left ventricle. Mitral Valve: The mitral valve is grossly normal. Mild mitral valve regurgitation. No evidence of mitral valve stenosis. Venous: The  inferior vena cava is dilated in size with greater than 50% respiratory variability, suggesting right atrial pressure of 8 mmHg. LEFT VENTRICLE PLAX 2D LVIDd:         6.00 cm LVIDs:  4.20 cm LV PW:         0.80 cm LV IVS:        1.30 cm                        3D Volume EF:                        3D EF:        33 %                        LV EDV:       196 ml                        LV ESV:       131 ml                        LV SV:        65 ml IVC IVC diam: 2.70 cm LEFT ATRIUM         Index LA diam:    4.70 cm 2.44 cm/m Lennie Odor MD Electronically signed by Lennie Odor MD Signature Date/Time: 03/05/2023/10:36:58 AM    Final    CT CHEST WO CONTRAST  Result Date: 03/04/2023 CLINICAL DATA:  Hypoxia, acute respiratory failure. Chest pain and pressure. EXAM: CT CHEST WITHOUT CONTRAST TECHNIQUE: Multidetector CT imaging of the chest was performed following the standard protocol without IV contrast. RADIATION DOSE REDUCTION: This exam was performed according to the departmental dose-optimization program which includes automated exposure control, adjustment of the mA and/or kV according to patient size and/or use of iterative reconstruction technique. COMPARISON:  No prior chest CT. Chest radiographs 03/04/2023 and 02/22/2023. Abdominal CTA 08/29/2012. FINDINGS: Cardiovascular: Atherosclerosis of the aorta, great vessels and coronary arteries. There are calcifications of the aortic valve. New mild cardiomegaly and a new small pericardial effusion. Mediastinum/Nodes: There are numerous prominent mediastinal and hilar lymph nodes, including a 1.3 cm short axis right paratracheal node on image 55/3 and a right infrahilar node measuring 1.1 cm short axis on image 85/3. No axillary adenopathy. The thyroid gland, trachea and esophagus demonstrate no significant findings. Lungs/Pleura: No pleural effusion or pneumothorax. Moderate centrilobular and paraseptal emphysema with scattered subpleural reticulation,  architectural distortion and central airway thickening. Asymmetric dependent airspace disease at the right lung base suspicious for pneumonia, possibly on the basis of aspiration. Cavitary subpleural nodule peripherally in the right upper lobe measuring 1.8 x 1.7 cm on image 54/4. This measures up to 2.1 cm on sagittal image 43/6. No other suspicious pulmonary nodules. Upper abdomen: No acute or suspicious findings are seen in the visualized upper abdomen. There is a stable right adrenal nodule measuring 2.5 cm and 8 HU consistent with an incidental lipid rich adrenal adenoma. No follow-up imaging recommended. Musculoskeletal/Chest wall: There is no chest wall mass or suspicious osseous finding. Unless specific follow-up recommendations are mentioned in the findings or impression sections, no imaging follow-up of any mentioned incidental findings is recommended. IMPRESSION: 1. Asymmetric dependent airspace disease at the right lung base suspicious for pneumonia, possibly on the basis of aspiration. 2. Cavitary subpleural nodule peripherally in the right upper lobe measuring up to 2.1 cm. This is nonspecific and could reflect a small abscess, although is suspicious for bronchogenic carcinoma. Short-term CT follow-up (2-3 months) suggested, and if persistent recommend further evaluation  with PET-CT. 3. Nonspecific mediastinal and hilar adenopathy, potentially reactive. 4. New mild cardiomegaly and a new small pericardial effusion. 5. 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 6. Stable incidental right adrenal adenoma. 7. Aortic Atherosclerosis (ICD10-I70.0) and Emphysema (ICD10-J43.9). Electronically Signed   By: Carey Bullocks M.D.   On: 03/04/2023 08:42   DG Chest Port 1 View  Result Date: 03/04/2023 CLINICAL DATA:  Chest pain. EXAM: PORTABLE CHEST 1 VIEW COMPARISON:  February 22, 2023 FINDINGS: The cardiac silhouette is moderately enlarged and stable in appearance. Mild to moderate severity  diffuse, chronic appearing increased interstitial lung markings are seen. Mild areas of atelectasis and/or infiltrate are seen within the mid left lung and bilateral lung bases. No pleural effusion or pneumothorax is identified. The visualized skeletal structures are unremarkable. IMPRESSION: Chronic appearing increased interstitial lung markings with mild areas of atelectasis and/or infiltrate within the mid left lung and bilateral lung bases. Electronically Signed   By: Aram Candela M.D.   On: 03/04/2023 03:31     Labs:   Basic Metabolic Panel: Recent Labs  Lab 03/04/23 0300 03/05/23 0157 03/06/23 0149  NA 133* 132* 133*  K 4.8 4.3 4.4  CL 101 95* 98  CO2 24 24 25   GLUCOSE 141* 99 90  BUN 16 29* 31*  CREATININE 1.55* 2.06* 1.86*  CALCIUM 8.2* 8.0* 8.1*  MG  --  2.1 2.2   GFR Estimated Creatinine Clearance: 36.4 mL/min (A) (by C-G formula based on SCr of 1.86 mg/dL (H)). Liver Function Tests: No results for input(s): "AST", "ALT", "ALKPHOS", "BILITOT", "PROT", "ALBUMIN" in the last 168 hours. No results for input(s): "LIPASE", "AMYLASE" in the last 168 hours. No results for input(s): "AMMONIA" in the last 168 hours. Coagulation profile No results for input(s): "INR", "PROTIME" in the last 168 hours.  CBC: Recent Labs  Lab 03/04/23 0300 03/05/23 0157 03/06/23 0149  WBC 12.2* 13.7* 11.8*  NEUTROABS 10.1* 10.6*  --   HGB 12.4* 11.9* 12.1*  HCT 39.2 36.5* 37.8*  MCV 96.3 96.1 96.4  PLT 361 366 349   Cardiac Enzymes: No results for input(s): "CKTOTAL", "CKMB", "CKMBINDEX", "TROPONINI" in the last 168 hours. BNP: Invalid input(s): "POCBNP" CBG: No results for input(s): "GLUCAP" in the last 168 hours. D-Dimer Recent Labs    03/04/23 0728  DDIMER 1.56*   Hgb A1c No results for input(s): "HGBA1C" in the last 72 hours. Lipid Profile No results for input(s): "CHOL", "HDL", "LDLCALC", "TRIG", "CHOLHDL", "LDLDIRECT" in the last 72 hours. Thyroid function  studies No results for input(s): "TSH", "T4TOTAL", "T3FREE", "THYROIDAB" in the last 72 hours.  Invalid input(s): "FREET3" Anemia work up No results for input(s): "VITAMINB12", "FOLATE", "FERRITIN", "TIBC", "IRON", "RETICCTPCT" in the last 72 hours. Microbiology No results found for this or any previous visit (from the past 240 hour(s)).   Discharge Instructions:   Discharge Instructions     Diet - low sodium heart healthy   Complete by: As directed    Discharge instructions   Complete by: As directed    Follow-up with your primary care provider in 1 week.  Check blood work at that time.  Follow-up with cardiology as outpatient in 2 to 3 weeks.  Take medications as prescribed.  Seek medical attention for worsening symptoms including any signs of bleeding.  Use oxygen as prescribed.   Increase activity slowly   Complete by: As directed       Allergies as of 03/06/2023   No Known Allergies  Medication List     STOP taking these medications    metoprolol tartrate 50 MG tablet Commonly known as: LOPRESSOR   PERSANTINE PO       TAKE these medications    APPLE CIDER VINEGAR PO Take 1 Dose by mouth See admin instructions. 2 teaspoons honey + apple cider vinegar to thin, every 6 hours as needed for cough.   aspirin EC 81 MG tablet Take 81 mg by mouth every other day.   b complex vitamins capsule Take 1 capsule by mouth daily.   CAYENNE PEPPER PO Take 1 tablet by mouth daily. Notes to patient: Please discontinue due to drug interaction   clopidogrel 75 MG tablet Commonly known as: PLAVIX Take 1 tablet (75 mg total) by mouth daily. Start taking on: March 07, 2023   colchicine 0.6 MG tablet Take 1 tablet (0.6 mg total) by mouth daily.   COQ10 PO Take 1 capsule by mouth daily.   furosemide 40 MG tablet Commonly known as: Lasix Take 1 tablet (40 mg total) by mouth daily. As needed for fluid in legs, weight gain   HAWTHORNE BERRY PO Take 1 tablet by  mouth daily. Notes to patient: Discontinue due to drug interaction, increases bleeding risk   MAGNESIUM PO Take 1 tablet by mouth daily.   metoprolol succinate 25 MG 24 hr tablet Commonly known as: TOPROL-XL Take 1 tablet (25 mg total) by mouth daily. Start taking on: March 07, 2023   nitroGLYCERIN 0.4 MG SL tablet Commonly known as: NITROSTAT Place 1 tablet (0.4 mg total) under the tongue every 5 (five) minutes as needed for chest pain.   OVER THE COUNTER MEDICATION Take 1 capsule by mouth daily. Black Cumin Seed. Notes to patient: Discontinue due to drug interaction, may cause low blood pressure   pantoprazole 40 MG tablet Commonly known as: Protonix Take 1 tablet (40 mg total) by mouth daily.   VITAMIN C PO Take 1 tablet by mouth daily.   VITAMIN D-3 PO Take 1 capsule by mouth daily.   ZINC PO Take 1 tablet by mouth daily.               Durable Medical Equipment  (From admission, onward)           Start     Ordered   03/06/23 1120  For home use only DME oxygen  Once       Question Answer Comment  Length of Need 6 Months   Mode or (Route) Nasal cannula   Liters per Minute 2   Frequency Continuous (stationary and portable oxygen unit needed)   Oxygen conserving device Yes   Oxygen delivery system Gas      03/06/23 1119            Follow-up Information     [name], MD Follow up in 1 week(s).   Specialty: Family Medicine Why: regular check up, blood work Contact information: [address] [place] [address] [phone]         Carlos Levering, NP Follow up.   Specialty: Cardiology Why: Cardiology Follow-up on 03/16/2023 at 8:25 AM. The office will contact you prior to this to arrange for a follow-up echocardiogram. Contact information: [address] [place] [place] [address] [phone]                  Time coordinating discharge: 39 minutes  Signed:     [facility] 03/06/2023, 4:04 PM

## 2023-03-06 NOTE — Progress Notes (Signed)
Patient discharged home driven by significant other. AVS printed and reviewed with patient and significant other. PIV removed, tolerated well. Vitals stable. Patient has portable oxygen with tubing. Oliver Barre, RN

## 2023-03-06 NOTE — Progress Notes (Addendum)
Progress Note  Patient Name: Jeffrey Norton Date of Encounter: 03/06/2023  Primary Cardiologist: Nanetta Batty, MD  Subjective   Serum creatinine improving  Inpatient Medications    Scheduled Meds:  aspirin EC  81 mg Oral Daily   clopidogrel  75 mg Oral Daily   heparin  5,000 Units Subcutaneous Q8H   metoprolol succinate  25 mg Oral Daily   sodium chloride flush  3 mL Intravenous Q12H   Continuous Infusions:  sodium chloride     PRN Meds: sodium chloride, acetaminophen, albuterol, ondansetron (ZOFRAN) IV, sodium chloride flush   Vital Signs    Vitals:   03/06/23 0700 03/06/23 0738 03/06/23 0800 03/06/23 0812  BP:  129/79  131/66  Pulse: 73 81 73   Resp:      Temp:    97.7 F (36.5 C)  TempSrc:    Oral  SpO2: 94%  (!) 88%   Weight:      Height:        Intake/Output Summary (Last 24 hours) at 03/06/2023 1105 Last data filed at 03/06/2023 0813 Gross per 24 hour  Intake 640 ml  Output 650 ml  Net -10 ml   Filed Weights   03/04/23 1441 03/05/23 0444 03/06/23 0431  Weight: 76.8 kg 76.8 kg 75.3 kg    Telemetry     Personally reviewed.  NSR  ECG   No new EKG  Physical Exam   GEN: No acute distress.   Neck: No JVD. Cardiac: RRR, no murmur, rub, or gallop.  Respiratory: Nonlabored. Clear to auscultation bilaterally. GI: Soft, nontender, bowel sounds present. MS: No edema; No deformity. Neuro:  Nonfocal. Psych: Alert and oriented x 3. Normal affect.  Labs    Chemistry Recent Labs  Lab 03/04/23 0300 03/05/23 0157 03/06/23 0149  NA 133* 132* 133*  K 4.8 4.3 4.4  CL 101 95* 98  CO2 24 24 25   GLUCOSE 141* 99 90  BUN 16 29* 31*  CREATININE 1.55* 2.06* 1.86*  CALCIUM 8.2* 8.0* 8.1*  GFRNONAA 48* 34* 38*  ANIONGAP 8 13 10      Hematology Recent Labs  Lab 03/04/23 0300 03/05/23 0157 03/06/23 0149  WBC 12.2* 13.7* 11.8*  RBC 4.07* 3.80* 3.92*  HGB 12.4* 11.9* 12.1*  HCT 39.2 36.5* 37.8*  MCV 96.3 96.1 96.4  MCH 30.5 31.3 30.9   MCHC 31.6 32.6 32.0  RDW 14.3 14.5 14.4  PLT 361 366 349    Cardiac Enzymes Recent Labs  Lab 02/22/23 0842 02/22/23 1150 03/04/23 0300 03/04/23 0450 03/05/23 0640  TROPONINIHS 5,714* 8,739* 3,937* 3,947* 2,712*    BNP Recent Labs  Lab 03/04/23 0300  BNP 395.6*     DDimer Recent Labs  Lab 03/04/23 0728  DDIMER 1.56*     Radiology    ECHOCARDIOGRAM LIMITED  Result Date: 03/05/2023    ECHOCARDIOGRAM LIMITED REPORT   Patient Name:   Jeffrey Norton Date of Exam: 03/05/2023 Medical Rec #:  [id number]        Height:       69.0 in Accession #:    [id number]       Weight:       169.3 lb Date of Birth:  August 25, 1950        BSA:          1.925 m Patient Age:    72 years         BP:           109/73 mmHg Patient  Gender: M                HR:           87 bpm. Exam Location:  Inpatient Procedure: Limited Echo, Cardiac Doppler, Limited Color Doppler, Strain Analysis            and 3D Echo Indications:    NSTEMI I21.4  History:        Patient has prior history of Echocardiogram examinations, most                 recent 02/22/2023. CHF, Previous Myocardial Infarction and CAD,                 Arrythmias:Atrial Fibrillation and Atrial Flutter; Risk                 Factors:Hypertension, Dyslipidemia and Former Smoker.  Sonographer:    Dondra Prader RVT RCS Referring Phys: [phone] CHRISTOPHER L SCHUMANN IMPRESSIONS  1. Inferior wall with severe hypokinesis. Left ventricular ejection fraction, by estimation, is 40 to 45%. The left ventricle has mildly decreased function. The left ventricle demonstrates regional wall motion abnormalities (see scoring diagram/findings  for description). The left ventricular internal cavity size was mildly dilated. There is mild asymmetric left ventricular hypertrophy.  2. Right ventricular systolic function is mildly reduced. The right ventricular size is mildly enlarged.  3. Moderate pericardial effusion now present posterior to LV. IVC is dilated up to 2.7 cm, but does  collapse. RV is dilated with collapse. No signs of tamponade. Moderate pericardial effusion. The pericardial effusion is posterior to the left ventricle.  4. The mitral valve is grossly normal. Mild mitral valve regurgitation. No evidence of mitral stenosis.  5. The inferior vena cava is dilated in size with >50% respiratory variability, suggesting right atrial pressure of 8 mmHg. Comparison(s): Changes from prior study are noted. EF unchanged. Inferior WMA more prominent. Moderate pericardial effusion predominantly posterior to the LV. No signs of tamponade. FINDINGS  Left Ventricle: Inferior wall with severe hypokinesis. Left ventricular ejection fraction, by estimation, is 40 to 45%. The left ventricle has mildly decreased function. The left ventricle demonstrates regional wall motion abnormalities. The left ventricular internal cavity size was mildly dilated. There is mild asymmetric left ventricular hypertrophy.  LV Wall Scoring: The entire inferior wall is hypokinetic. Right Ventricle: The right ventricular size is mildly enlarged. No increase in right ventricular wall thickness. Right ventricular systolic function is mildly reduced. Pericardium: Moderate pericardial effusion now present posterior to LV. IVC is dilated up to 2.7 cm, but does collapse. RV is dilated with collapse. No signs of tamponade. A moderately sized pericardial effusion is present. The pericardial effusion is posterior to the left ventricle. Mitral Valve: The mitral valve is grossly normal. Mild mitral valve regurgitation. No evidence of mitral valve stenosis. Venous: The inferior vena cava is dilated in size with greater than 50% respiratory variability, suggesting right atrial pressure of 8 mmHg. LEFT VENTRICLE PLAX 2D LVIDd:         6.00 cm LVIDs:         4.20 cm LV PW:         0.80 cm LV IVS:        1.30 cm                        3D Volume EF:  3D EF:        33 %                        LV EDV:       196 ml                         LV ESV:       131 ml                        LV SV:        65 ml IVC IVC diam: 2.70 cm LEFT ATRIUM         Index LA diam:    4.70 cm 2.44 cm/m Lennie Odor MD Electronically signed by Lennie Odor MD Signature Date/Time: 03/05/2023/10:36:58 AM    Final      Assessment & Plan    Acute hypoxic respiratory failure likely secondary to acute systolic heart failure exacerbation: LVEF on 02/23/2023 was 40 to 45% with mild RV dysfunction and mild RV enlargement, likely ischemic cardiomyopathy.  Echo this admission showed similar LVEF with severe inferior wall hypokinesis and moderate pericardial effusion with no signs of tamponade. IVC dilated but collapsible.  Patient was diuresed resulting in AKI, diuretics on hold since yesterday, serum creatinine improving. He will need outpatient PFTs to rule out pulmonary etiology of hypoxia.  Moderate pericardial effusion: Echo from 02/23/2023 did not show any evidence of pericardial effusion however echo from 03/05/2023 showed moderate pericardial effusion posterior to the LV, IVC was collapsible but dilated. No signs of tamponade. Due to his presentation of chest pain and moderate pericardial effusion, his presentation is likely consistent with acute pericarditis c/w pericardial effusion. Therapeutic options to treat pericarditis/pericardial effusion is limited by recent MI (steroids contraindicated), high-dose aspirin and NSAIDs contraindicated due to AKI and gastritis/esophagitis with hemorrhage on EGD.  Will monitor for now and repeat limited echocardiogram in 1 week.  Start colchicine 0.6 mg once daily, obtain ESR and CRP.  CAD manifested by late inferior MI in 7/24 s/p RCA ISR angioplasty and thrombectomy: Initially was on aspirin, Brilinta and Eliquis but hospital course was complicated by acute blood loss anemia secondary to hematochezia and melena.  He refused EGD and left AMA at that time.  Upon discharge, he stopped taking all his medications as he  did not believe in pharmaceuticals.  He presented again with chest pain, troponins downtrending compared to prior admission, not consistent with ACS.  LHC was not performed during this admission due to blood loss anemia in the prior admission. Currently, he is on aspirin 81 mg once daily and Plavix 75 mg once daily which we will continue.  Will hold off on systemic AC for now.  Ischemic cardiomyopathy LVEF 40 to 45%, continue metoprolol succinate 25 mg once daily. Uptitration GDMT is limited by AKI.  Can optimize GDMT in outpatient setting.  History of acute blood loss anemia on triple therapy during recent admission in 01/2023: Patient refused EGD and left AMA in the prior admission. Underwent EGD this admission that showed gastritis and esophagitis. GI recommended PPIs.  History of atrial flutter diagnosed in 12/23: Self discontinued Eliquis due to hematochezia.  Continues to take metoprolol at home.  No recurrence of atrial flutter.  Not a candidate for anticoagulation. I do not think he will agree to [facility] device implantation either.  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   [facility] will sign off.   Medication Recommendations: Metoprolol succinate 25 mg once daily, aspirin 81 mg once daily, Plavix 75 mg once daily, colchicine 0.6 mg once daily, p.o. Lasix 20 mg as needed SOB/LE swelling. Other recommendations (labs, testing, etc): BMP in 3 days, limited echo in 1 week Follow up as an outpatient: Cardiology follow-up in 1 week to 10 days    Signed,  P , MD  03/06/2023, 11:05 AM

## 2023-03-06 NOTE — TOC Transition Note (Signed)
Transition of Care [facility]) - CM/SW Discharge Note   Patient Details  Name: Jeffrey Norton MRN: [id number] Date of Birth: [birth year]  [facility] CM/SW Contact:  Lawerance Sabal, RN Phone Number: 03/06/2023, 11:42 AM   Clinical Narrative:     Home oxygen ordered through [employer] who will bring it to the room for DC today   Final next level of care: Home/Self Care Barriers to Discharge: No Barriers Identified   Patient Goals and CMS Choice      Discharge Placement                         Discharge Plan and Services Additional resources added to the After Visit Summary for                  DME Arranged: Oxygen DME Agency: [employer] Date DME Agency Contacted: 03/06/23 Time DME Agency Contacted: 1142 Representative spoke with at DME Agency: Vaughan Basta            Social Determinants of Health (SDOH) Interventions SDOH Screenings   Food Insecurity: No Food Insecurity (03/04/2023)  Housing: Low Risk  (03/04/2023)  Transportation Needs: No Transportation Needs (03/04/2023)  Utilities: Not At Risk (03/04/2023)  Tobacco Use: Medium Risk (03/05/2023)     Readmission Risk Interventions     No data to display

## 2023-03-06 NOTE — Telephone Encounter (Signed)
   Transition of Care Follow-up Phone Call Request    Patient Name: Jeffrey Norton Date of Birth: 02/07/1951 Date of Encounter: 03/06/2023  Primary Care Provider:  [name], MD Primary Cardiologist:  Nanetta Batty, MD  Jeffrey Norton has been scheduled for a transition of care follow up appointment with a [facility] provider:  Carlos Levering, NP on 03/16/2023. A separate message was sent to the office to arrange for a limited echo and BMET this week.   Please reach out to Jeffrey Norton within 48 hours to confirm appointment and review transition of care protocol questionnaire.  Ellsworth Lennox, PA-C  03/06/2023, 11:48 AM

## 2023-03-08 ENCOUNTER — Encounter (HOSPITAL_COMMUNITY): Payer: Self-pay | Admitting: Gastroenterology

## 2023-03-08 NOTE — Anesthesia Postprocedure Evaluation (Signed)
Anesthesia Post Note  Patient: Jeffrey Norton  Procedure(s) Performed: ESOPHAGOGASTRODUODENOSCOPY (EGD) WITH PROPOFOL BIOPSY     Patient location during evaluation: Endoscopy Anesthesia Type: MAC Level of consciousness: oriented, awake and alert and awake Pain management: pain level controlled Vital Signs Assessment: post-procedure vital signs reviewed and stable Respiratory status: spontaneous breathing, nonlabored ventilation, respiratory function stable and patient connected to nasal cannula oxygen Cardiovascular status: blood pressure returned to baseline and stable Postop Assessment: no headache, no backache and no apparent nausea or vomiting Anesthetic complications: no   No notable events documented.  Last Vitals:  Vitals:   03/06/23 1111 03/06/23 1130  BP:  128/75  Pulse: 69 77  Resp:  20  Temp:  36.7 C  SpO2: 96% 94%    Last Pain:  Vitals:   03/06/23 1130  TempSrc: Oral  PainSc:                  Collene Schlichter

## 2023-03-08 NOTE — Telephone Encounter (Signed)
Pt is still admitted.  

## 2023-03-09 NOTE — Telephone Encounter (Signed)
 Call to patient and LM to call office

## 2023-03-09 NOTE — Telephone Encounter (Signed)
Patient contacted regarding discharge from [facility] on 03/06/23.  Patient understands to follow up with provider Carlos Levering on 03/16/23 at 08:20 am at [facility]. Patient understands discharge instructions? Yes Patient understands medications and regiment? Yes Patient understands to bring all medications to this visit? Yes  Ask patient:  Are you enrolled in My Chart Yes

## 2023-03-10 ENCOUNTER — Ambulatory Visit (HOSPITAL_COMMUNITY): Payer: Self-pay | Attending: Internal Medicine

## 2023-03-10 DIAGNOSIS — I3139 Other pericardial effusion (noninflammatory): Secondary | ICD-10-CM | POA: Insufficient documentation

## 2023-03-10 LAB — ECHOCARDIOGRAM LIMITED
Area-P 1/2: 3.95 cm2
MV M vel: 4.5 m/s
MV Peak grad: 81 mmHg
P 1/2 time: 371 msec
S' Lateral: 4.5 cm

## 2023-03-11 NOTE — Progress Notes (Signed)
Cardiology Clinic Note   Date: 03/16/2023 ID: Jeffrey Norton, DOB 1951/07/18, MRN [id number]  Primary Cardiologist:  Nanetta Batty, MD  Patient Profile    Jeffrey Norton is a 72 y.o. male who presents to the clinic today for hospital follow up.     Past medical history significant for: CAD. PCI with DES to RCA 2003. PCI with DES for in-stent restenosis to RCA 2005. LHC 07/07/2022 (NSTEMI): LAD 45%.  Ostial to proximal CX 40%.  OM1 40%.  Proximal to mid RCA 50%.  Nonobstructive CAD.  No culprit lesion to explain severe chest pain.  Diffuse in-stent disease RCA 50%.  Normal LVEDP.  Mildly dilated aorta without dissection.  Recommend medical management. LHC 02/22/2023 (NSTEMI): Proximal to mid LAD 45%.  Ostial proximal LCx 40%.  OM1 40%.  Proximal mid RCA 100% in-stent restenosis.  PCI with balloon angioplasty.  Postintervention 40% residual stenosis. PAF/a-flutter. Chronic systolic heart failure. Echo 03/05/2023: EF 40 to 45%.  RWMA.  Mild asymmetric LVH.  Mildly reduced RV function.  Mild RVH.  Moderate pericardial effusion now present posterior to LV.  IVC is dilated 2.7 cm RV is dilated with collapse.  RA pressure 8 mmHg. Pericarditis/pericardial effusion. Limited echo 03/10/2023: EF 40 to 45%.  Grade I DD.  Moderately reduced RV function.  Posterior pericardial effusion smaller from previous (03/05/2023).  Small pericardial effusion is present posterior to LV.  No evidence of cardiac tamponade.  Mild MR.  Mild AI.  Aortic valve sclerosis/calcification without stenosis.  Mild dilatation of ascending aorta 40 mm.  Borderline dilatation of aortic root 39 mm. PAD. Abdominal aortogram/lower extremities 08/15/2012: PTA and stenting of left external iliac and common femoral arteries. Hypertension. Hyperlipidemia. CKD stage III.     History of Present Illness    Jeffrey Norton longtime patient of cardiology followed by Dr. [name] for the above outlined history.  In summary, history of MI  undergoing PCI with DES to RCA 2003 and PCI with DES for in-stent restenosis to RCA in 2005.  He has PAD with stenting of left external iliac and common femoral arteries in January 2014.  He presented to the ED on 07/07/2022 with severe chest pain.  EKG showed a-flutter with 2-1 AV conduction, ST elevation in V2-V3.  ST elevation was felt to be due to LVH but given persistent chest pain and elevated troponin he was taken to the Cath Lab for emergent cardiac catheterization which did not reveal culprit lesion (further details above) and demand ischemia was suspected in the setting of rapid A-fib/flutter and severely elevated BP.  Medical management was recommended with optimal BP control.  Echo showed EF 45 to 50%, global hypokinesis, grade 2 DD.  He was diuresed with IV Lasix in the setting of pulmonary edema.  He was started on metoprolol and Eliquis.  Converted to NSR prior to discharge on 07/08/2022.  Patient presented to the ED on 02/22/2023 with complaints of chest pressure and shortness of breath.  He felt he was in A-fib.  SpO2 88% RA.  Patient reported substernal chest pain for few days not associated with exertion.  Baseline shortness of breath.  EKG with subtle ST changes, <1 mm elevation inferior leads.  Chest x-ray showed possible pulmonary edema.  Troponin 5714>> 8739.  BNP 560.5.  Potassium 6, repeat 5.4.  Patient reported not taking Eliquis.  He was started on heparin and nitroglycerin and pain somewhat improved.  He was taken to the Cath Lab semiemergently where he he was  found to have 100% in-stent restenosis proximal to mid RCA and underwent PCI with balloon angioplasty to RCA. Plan to continue heparin drip secondary to suspicion of extensive thrombus. Hospital course complicated by 1 episode of hemoptysis and 3 episodes of black stool. Heparin was held and GI was consulted. He received IV PPI for GI bleeding and it was recommended he undergo EGD for which he would not sign the consent.   Dr.  Geraldo Pitter was called to patient's room on 02/23/2023 secondary to patient's insistence on leaving. Per Dr. Driscilla Grammes note "I had a long discussion with Jeffrey Norton, his wife and his primary nurse at bedside.  I reviewed the above data and described in detail his concerning and extensive coronary artery disease in combination with his gastrointestinal bleeding.  I discussed with him that he is very likely still experiencing an ongoing GI bleed.  I discussed that he is high risk for an additional coronary ischemic event, worsening heart failure, life-threatening arrhythmias, and significant gastrointestinal bleeding all of which could lead to significant morbidity acutely or even death.  His wife expressed wishes for him to stay in the hospital.  Jeffrey Norton was very adamant that he did not want to stay in the hospital for any second longer.  His main concern was his significant anxiety and inability to sleep in the hospital.  I discussed that I was willing to give him anxiety reducing medications, including benzodiazepines, to help with sleep and his anxiety in hopes that he would stay in the hospital for closer monitoring and continued therapy.  He was resistant to these options.  He stated on multiple occasions that he understands the risks of leaving the hospital at this time including all of the above as well as those that are unforeseeable and may be related to his recent heart attack and/or ongoing gastrointestinal bleeding.  I also discussed with him that recommending medications for treatment at this time of his heart attack are difficult as we do not have all of the information that we would want and reasonably be able to obtain this hospitalization in order to come up with the best regimen for discharge.  In particular, he needs antiplatelet therapy given the recent coronary intervention and significant coronary artery disease resulting myocardial infarction that he presented with.  Given his  gastrointestinal bleeding and high risk of ongoing gastrointestinal bleeding on antiplatelet medication, he will be discharged on aspirin 325 mg once daily and no P2Y12 inhibition.  Similarly, he will not be discharged on anticoagulation.  I explained my rationale for this decision.  I also prescribed pantoprazole 40 mg twice daily for treatment of presumed acute upper GI bleed.   I instructed him to present immediately to medical care or call 911 should he develop new acute onset chest pain, shortness of breath, dizziness/lightheadedness, blood in his emesis, blood in his stool, if he passes out or any new concerning symptom.  We will be happy to treat him here if he requires further care.  I also let him know that we will contact him in the morning and set up a follow-up appointment for further treatment considerations.  Despite all of the above, the patient is adamant that he is going to leave AGAINST MEDICAL ADVICE and is willing to sign the appropriate paperwork.  He is able to explain the risks  of leaving AMA at this time and displays decision-making capacity.  I discussed all of the above with Dr. Izora Ribas, cardiology, who is in agreement  with the plan."  Patient presented to the ED via EMS on 03/04/2023 for gradual onset of central non-radiating chest pain/pressure that began 5 hours prior to arrival. He took 324 mg ASA and was given NTG x 2 and 6 mg morphine. Initial SPO2 91% increased to 96% on 4L [place]. Patient reported no further bleeding since leaving hospital on 02/23/2023. He was admitted for suspected CHF. Troponin [id number]. D dimer elevated at 1.56. BNP decreased from previous at 395.6.  Cardiology was consulted.  He reported he stopped aspirin due to concern for GI bleeding and his wife was giving him herbal supplements.  He began having chest discomfort on the evening of 8 7 and had been persistent until evaluation on 8/8 (approximately 14 hours).  EKG showed minimal J-point elevation in  inferior leads improved when compared to EKG from 7/29.  Deep Q waves in inferior leads consistent with inferior wall infarct still present.  Plan for repeat limited echo to reassess EF, restart aspirin, consider restarting beta-blocker.  CT without contrast showed questionable pneumonia in the right base of lung.  He will continue to be diuresed with IV Lasix.  GI was consulted and planning EGD.  Echo showed moderate pericardial effusion with no tamponade.  EGD showed gastritis with hemorrhage that was biopsied, duodenal erosions without bleeding.  GI approved restart of anticoagulation.  Discussed with patient and he was adamant about not restarting Brilinta or Eliquis.  He agreed to Plavix which he had previously tolerated well.  Plan for repeat echo in 1 week to monitor effusion.  He was started on colchicine and discharged on 03/06/2023.  Repeat limited echo showed decreased pericardial effusion now graded as small with no evidence of tamponade.  Today, patient is accompanied by his wife. He reports he has been doing well for the last 4-5 days. Patient denies shortness of breath or dyspnea on exertion. No chest pain, pressure, or tightness. Denies lower extremity edema, orthopnea, or PND. No palpitations. He has had no further bleeding since hospital discharge. He is ready to get back to his normal activities. He has a [employer] rights [employer] tournament coming up and his wife is very nervous about him going. He will be in the kayak by himself but around a lot of other people. Encouraged patient to take a couple of short kayaking trips leading up to his fishing tournament to test out his activity tolerance.    ROS: All other systems reviewed and are otherwise negative except as noted in History of Present Illness.  Studies Reviewed    EKG Interpretation Date/Time:  Tuesday March 16 2023 08:26:00 EDT Ventricular Rate:  81 PR Interval:  140 QRS Duration:  100 QT Interval:  354 QTC Calculation: 411 R  Axis:   -29  Text Interpretation: Normal sinus rhythm Possible Left atrial enlargement Left ventricular hypertrophy ( R in aVL , Cornell product ) Inferior infarct , age undetermined T wave abnormality, consider lateral ischemia When compared with ECG of 04-Mar-2023 04:28, PREVIOUS ECG IS PRESENT Confirmed by Carlos Levering [id number]) on 03/16/2023 8:38:03 AM    Risk Assessment/Calculations     CHA2DS2-VASc Score = 3   This indicates a 3.2% annual risk of stroke. The patient's score is based upon: CHF History: 0 HTN History: 1 Diabetes History: 0 Stroke History: 0 Vascular Disease History: 1 Age Score: 1 Gender Score: 0  Declined anticoagulation.             Physical Exam    VS:  BP (!) 143/60  Pulse 81   Ht 5\' 9"  (1.753 m)   Wt 172 lb (78 kg)   SpO2 91%   BMI 25.40 kg/m  , BMI Body mass index is 25.4 kg/m.  GEN: Well nourished, well developed, in no acute distress. Neck: No JVD or carotid bruits. Cardiac:  RRR. No murmurs. No rubs or gallops.   Respiratory:  Respirations regular and unlabored. Clear to auscultation without rales, wheezing or rhonchi. GI: Soft, nontender, nondistended. Extremities: Radials/DP/PT 2+ and equal bilaterally. No clubbing or cyanosis. No edema.  Skin: Warm and dry, no rash. Neuro: Strength intact.  Assessment & Plan    CAD.  S/p PCI with DES to RCA 2003, DES for in-stent restenosis to RCA 2005.  Diffuse in-stent disease RCA approximately 50% seen on angiography December 2023.  NSTEMI July 2024.  Proximal to mid RCA 100% in-stent restenosis s/p PCI with balloon angioplasty.  Heparin drip continued secondary to suspicion of extensive thrombus.  Patient's hospital course complicated by GI bleeding in the setting of triple therapy with heparin, Brilinta, and aspirin (full details in HPI). Patient denies chest pain, tightness or pressure. Continue aspirin, Plavix, metoprolol, as needed SL NTG. Pericarditis/pericardial effusion.  Echo 03/05/2023  showed moderate pericardial effusion posterior to LV without cardiac tamponade.  Patient started on colchicine.  Repeat echo 03/10/2023 showed effusion decreased in size now described as small pericardial effusion without cardiac tamponade.  Patient denies shortness of breath, DOE, palpitations, chest pain/pressure. Continue colchicine.   Chronic systolic heart failure.  Echo August 2024 showed EF 40 to 45%, moderately reduced RV function, mild RVH.  Patient denies lower extremity edema, shortness of breath, or DOE. His wife felt he had edema one day last week for which he took a dose of Lasix but none since. Euvolemic and well compensated on exam.  Continue metoprolol and Lasix as needed. Suggested daily weight.  PAF.  Rapid A-fib/flutter in the setting of NSTEMI December 2023.  Patient was started on Eliquis and metoprolol.  Self discontinued Eliquis after hospital discharge.  Patient denies palpitations. EKG today shows NSR, rate 81 bpm. Continue Toprol.  He declines anticoagulation. Hypertension: BP today 143/60 on intake and 136/78 on my recheck. Patient denies headaches, dizziness or vision changes. Continue Toprol.  Disposition: BMP today. Return in 3 months or sooner as needed.          Signed, Etta Grandchild. Mende Biswell, DNP, NP-C

## 2023-03-16 ENCOUNTER — Ambulatory Visit: Payer: Self-pay | Attending: Student | Admitting: Student

## 2023-03-16 ENCOUNTER — Encounter: Payer: Self-pay | Admitting: Student

## 2023-03-16 VITALS — BP 136/78 | HR 81 | Ht 69.0 in | Wt 172.0 lb

## 2023-03-16 DIAGNOSIS — I2510 Atherosclerotic heart disease of native coronary artery without angina pectoris: Secondary | ICD-10-CM

## 2023-03-16 DIAGNOSIS — I10 Essential (primary) hypertension: Secondary | ICD-10-CM

## 2023-03-16 DIAGNOSIS — I319 Disease of pericardium, unspecified: Secondary | ICD-10-CM

## 2023-03-16 DIAGNOSIS — I5022 Chronic systolic (congestive) heart failure: Secondary | ICD-10-CM

## 2023-03-16 DIAGNOSIS — I3139 Other pericardial effusion (noninflammatory): Secondary | ICD-10-CM

## 2023-03-16 DIAGNOSIS — Z79899 Other long term (current) drug therapy: Secondary | ICD-10-CM

## 2023-03-16 DIAGNOSIS — I480 Paroxysmal atrial fibrillation: Secondary | ICD-10-CM

## 2023-03-16 NOTE — Patient Instructions (Addendum)
Medication Instructions:  *If you need a refill on your cardiac medications before your next appointment, please call your pharmacy*   Lab Work: BMET drawn today for medication management If you have labs (blood work) drawn today and your tests are completely normal, you will receive your results only by: MyChart Message (if you have MyChart) OR A paper copy in the mail If you have any lab test that is abnormal or we need to change your treatment, we will call you to review the results.   Follow-Up: At [facility], you and your health needs are our priority.  As part of our continuing mission to provide you with exceptional heart care, we have created designated Provider Care Teams.  These Care Teams include your primary Cardiologist (physician) and Advanced Practice Providers (APPs -  Physician Assistants and Nurse Practitioners) who all work together to provide you with the care you need, when you need it.  We recommend signing up for the patient portal called "MyChart".  Sign up information is provided on this After Visit Summary.  MyChart is used to connect with patients for Virtual Visits (Telemedicine).  Patients are able to view lab/test results, encounter notes, upcoming appointments, etc.  Non-urgent messages can be sent to your provider as well.   To learn more about what you can do with MyChart, go to [email or web address].    Your next appointment:   3 month(s)  Provider:   Nanetta Batty, MD  or APP

## 2023-03-17 LAB — BASIC METABOLIC PANEL
BUN/Creatinine Ratio: 20 (ref 10–24)
BUN: 33 mg/dL — ABNORMAL HIGH (ref 8–27)
CO2: 25 mmol/L (ref 20–29)
Calcium: 8.9 mg/dL (ref 8.6–10.2)
Chloride: 103 mmol/L (ref 96–106)
Creatinine, Ser: 1.66 mg/dL — ABNORMAL HIGH (ref 0.76–1.27)
Glucose: 81 mg/dL (ref 70–99)
Potassium: 5 mmol/L (ref 3.5–5.2)
Sodium: 140 mmol/L (ref 134–144)
eGFR: 44 mL/min/{1.73_m2} — ABNORMAL LOW (ref >59)

## 2023-04-15 ENCOUNTER — Telehealth: Payer: Self-pay | Admitting: Cardiovascular Disease

## 2023-04-15 NOTE — Telephone Encounter (Signed)
Paper Work Dropped Off: Trip insurance documents   Date:04/15/2023  Location of paper: [employer]

## 2023-04-21 DIAGNOSIS — I739 Peripheral vascular disease, unspecified: Secondary | ICD-10-CM | POA: Diagnosis not present

## 2023-04-21 DIAGNOSIS — I2510 Atherosclerotic heart disease of native coronary artery without angina pectoris: Secondary | ICD-10-CM | POA: Diagnosis not present

## 2023-04-21 DIAGNOSIS — Z6825 Body mass index (BMI) 25.0-25.9, adult: Secondary | ICD-10-CM | POA: Diagnosis not present

## 2023-04-21 DIAGNOSIS — I10 Essential (primary) hypertension: Secondary | ICD-10-CM | POA: Diagnosis not present

## 2023-04-23 ENCOUNTER — Telehealth: Payer: Self-pay | Admitting: Cardiology

## 2023-04-23 NOTE — Telephone Encounter (Signed)
(  Insurance) paperwork given to Anitra to put into Dr. Campbell Lerner mailbox. Pt states paperwork is for Dr. Bjorn Pippin as he was attending physician during pt's hospital stay.  Pt states paperwork is needed by Tues., 10/1, if possible. May contact patient on [phone] once paperwork is complete & ready for pickup.  JB, 04-23-23

## 2023-04-27 ENCOUNTER — Telehealth: Payer: Self-pay

## 2023-04-27 NOTE — Telephone Encounter (Signed)
Called patient to let them know their Paper work was done and placed upfront waiting for him.

## 2023-05-07 NOTE — Telephone Encounter (Signed)
Already taken care of see 10/1 telephone note.

## 2023-05-26 DIAGNOSIS — I10 Essential (primary) hypertension: Secondary | ICD-10-CM | POA: Diagnosis not present

## 2023-06-02 DIAGNOSIS — I4891 Unspecified atrial fibrillation: Secondary | ICD-10-CM | POA: Diagnosis not present

## 2023-06-02 DIAGNOSIS — I509 Heart failure, unspecified: Secondary | ICD-10-CM | POA: Diagnosis not present

## 2023-06-02 DIAGNOSIS — E785 Hyperlipidemia, unspecified: Secondary | ICD-10-CM | POA: Diagnosis not present

## 2023-06-02 DIAGNOSIS — I5023 Acute on chronic systolic (congestive) heart failure: Secondary | ICD-10-CM | POA: Diagnosis not present

## 2023-06-08 ENCOUNTER — Telehealth: Payer: Self-pay | Admitting: Cardiovascular Disease

## 2023-06-08 ENCOUNTER — Other Ambulatory Visit: Payer: Self-pay

## 2023-06-08 MED ORDER — METOPROLOL SUCCINATE ER 25 MG PO TB24: 25.0000 mg | ORAL_TABLET | Freq: Every day | ORAL | 2 refills | Status: DC

## 2023-06-08 MED ORDER — METOPROLOL SUCCINATE ER 25 MG PO TB24: 25.0000 mg | ORAL_TABLET | Freq: Every day | ORAL | 3 refills | Status: DC

## 2023-06-08 MED ORDER — CLOPIDOGREL BISULFATE 75 MG PO TABS: 75.0000 mg | ORAL_TABLET | Freq: Every day | ORAL | 3 refills | Status: DC

## 2023-06-08 MED ORDER — CLOPIDOGREL BISULFATE 75 MG PO TABS: 75.0000 mg | ORAL_TABLET | Freq: Every day | ORAL | 2 refills | Status: DC

## 2023-06-08 NOTE — Telephone Encounter (Signed)
Pt's medication was sent to pt's pharmacy as requested. Confirmation received.  °

## 2023-06-08 NOTE — Telephone Encounter (Signed)
*  STAT* If patient is at the pharmacy, call can be transferred to refill team.   1. Which medications need to be refilled? (please list name of each medication and dose if known)  clopidogrel (PLAVIX) 75 MG tablet metoprolol succinate (TOPROL-XL) 25 MG 24 hr tablet  2. Which pharmacy/location (including street and city if local pharmacy) is medication to be sent to? [facility] [address] [place] - [address]  3. Do they need a 30 day or 90 day supply?  90 day supply

## 2023-06-16 ENCOUNTER — Ambulatory Visit: Payer: Medicare Other | Attending: Cardiovascular Disease | Admitting: Cardiovascular Disease

## 2023-06-16 ENCOUNTER — Encounter: Payer: Self-pay | Admitting: Cardiovascular Disease

## 2023-06-16 VITALS — BP 162/80 | HR 75 | Ht 68.0 in | Wt 174.0 lb

## 2023-06-16 DIAGNOSIS — I5020 Unspecified systolic (congestive) heart failure: Secondary | ICD-10-CM

## 2023-06-16 DIAGNOSIS — I480 Paroxysmal atrial fibrillation: Secondary | ICD-10-CM

## 2023-06-16 DIAGNOSIS — I739 Peripheral vascular disease, unspecified: Secondary | ICD-10-CM | POA: Diagnosis not present

## 2023-06-16 DIAGNOSIS — I10 Essential (primary) hypertension: Secondary | ICD-10-CM | POA: Diagnosis not present

## 2023-06-16 NOTE — Patient Instructions (Addendum)
Medication Instructions:  Your physician recommends that you continue on your current medications as directed. Please refer to the Current Medication list given to you today.  *If you need a refill on your cardiac medications before your next appointment, please call your pharmacy*   Testing/Procedures: Your physician has requested that you have an Aorta/Iliac Duplex. This will be take place at [address] [facility], [address].  No food after 11PM the night before.  Water is OK. (Don't drink liquids if you have been instructed not to for ANOTHER test) Avoid foods that produce bowel gas, for 24 hours prior to exam (see below). No breakfast, no chewing gum, no smoking or carbonated beverages. Patient may take morning medications with water. Come in for test at least 15 minutes early to register.  Please note: We ask at that you not bring children with you during ultrasound (echo/ vascular) testing. Due to room size and safety concerns, children are not allowed in the ultrasound rooms during exams. Our front office staff cannot provide observation of children in our lobby area while testing is being conducted. An adult accompanying a patient to their appointment will only be allowed in the ultrasound room at the discretion of the ultrasound technician under special circumstances. We apologize for any inconvenience.   Your physician has requested that you have a lower extremity arterial duplex. During this test, ultrasound is used to evaluate arterial blood flow in the legs. Allow one hour for this exam. There are no restrictions or special instructions. This will take place at [address] [facility], [address].  Please note: We ask at that you not bring children with you during ultrasound (echo/ vascular) testing. Due to room size and safety concerns, children are not allowed in the ultrasound rooms during exams. Our front office staff cannot provide observation of children in our lobby area while testing  is being conducted. An adult accompanying a patient to their appointment will only be allowed in the ultrasound room at the discretion of the ultrasound technician under special circumstances. We apologize for any inconvenience.   Your physician has requested that you have an ankle brachial index (ABI). During this test an ultrasound and blood pressure cuff are used to evaluate the arteries that supply the arms and legs with blood. Allow thirty minutes for this exam. There are no restrictions or special instructions. This will take place at [address] [facility], [address].    Please note: We ask at that you not bring children with you during ultrasound (echo/ vascular) testing. Due to room size and safety concerns, children are not allowed in the ultrasound rooms during exams. Our front office staff cannot provide observation of children in our lobby area while testing is being conducted. An adult accompanying a patient to their appointment will only be allowed in the ultrasound room at the discretion of the ultrasound technician under special circumstances. We apologize for any inconvenience.    Follow-Up: At [facility], you and your health needs are our priority.  As part of our continuing mission to provide you with exceptional heart care, we have created designated Provider Care Teams.  These Care Teams include your primary Cardiologist (physician) and Advanced Practice Providers (APPs -  Physician Assistants and Nurse Practitioners) who all work together to provide you with the care you need, when you need it.  We recommend signing up for the patient portal called "MyChart".  Sign up information is provided on this After Visit Summary.  MyChart is  used to connect with patients for Virtual Visits (Telemedicine).  Patients are able to view lab/test results, encounter notes, upcoming appointments, etc.  Non-urgent messages can be sent to your provider as well.   To learn more about what you  can do with MyChart, go to [email or web address].    Your next appointment:   6 month(s)  Provider:   Micah Flesher, PA-C or Bernadene Person, NP       Then, Nanetta Batty, MD will plan to see you again in 12 month(s).

## 2023-06-16 NOTE — Assessment & Plan Note (Signed)
History of CAD status post RCA intervention by myself 09/11/2001 in the setting of inferior STEMI.  He was recapped in 2005 at [facility] when he had "in-stent restenosis.  In December of last year which time he had nonobstructive disease and again most recently 02/22/2023 when he presented with inferior STEMI.  He had acute stent thrombosis and had PCI by Dr. Eldridge Dace along with aspiration thrombectomy.  He does not restented.  He was late presenting.  His EF was in the 45 to 50% range which is chronic.  He remains on aspirin clopidogrel and has been asymptomatic.  I did review his cath films and had a question about whether or not he had a lesion in his left main which was not commented on

## 2023-06-16 NOTE — Progress Notes (Signed)
06/16/2023 [name]   [birth year]  [id number]  Primary Physician Jeffrey Lek, MD Primary Cardiologist: Jeffrey Gess MD Jeffrey Norton, [place]  HPI:  Jeffrey Norton is a 72 y.o. thin-appearing married Caucasian male father of 4 children, grandfather 6 grandchildren who I last saw 10 years ago at the time of left common and external iliac artery intervention.  He recently retired from [employer] 07/16/2022.  He stopped smoking in 2000.  Does have a history of hypertension as well as family history of heart disease with both parents and brother all who have had ischemic heart disease.  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  I treated him with aspiration thrombectomy, PCI and stenting.  I did document a 40% renal artery stenosis at that time.  He had reintervention 2005 [facility] for in-stent restenosis.  He was cathed again December 2023 with 50% in-stent restenosis of his RCA stent.  Presented 02/22/2023 with inferior STEMI with thrombotic occlusion of his RCA stents with left-to-right collaterals.  He underwent PTA and aspiration thrombectomy Dr. Eldridge Norton.  He did have some GI bleeding during his hospitalization on heparin.  He was discharged home on DAPT.  He said no further episodes of chest pain.  He is fairly active and denies claudication.  He fishes daily.   Current Meds  Medication Sig   APPLE CIDER VINEGAR PO Take 1 Dose by mouth See admin instructions. 2 teaspoons honey + apple cider vinegar to thin, every 6 hours as needed for cough.   Ascorbic Acid (VITAMIN C PO) Take 1 tablet by mouth daily.   aspirin EC 81 MG tablet Take 81 mg by mouth every other day.   b complex vitamins capsule Take 1 capsule by mouth daily.   clopidogrel (PLAVIX) 75 MG tablet Take 1 tablet (75 mg total) by mouth daily.   Coenzyme Q10 (COQ10 PO) Take 1 capsule by mouth daily.   ergocalciferol (VITAMIN D2) 1.25 MG (50000 UT) capsule Take 50,000  Units by mouth daily.   furosemide (LASIX) 40 MG tablet Take 1 tablet (40 mg total) by mouth daily. As needed for fluid in legs, weight gain   MAGNESIUM PO Take 1 tablet by mouth daily.   metoprolol succinate (TOPROL-XL) 25 MG 24 hr tablet Take 1 tablet (25 mg total) by mouth daily.   Multiple Vitamins-Minerals (ZINC PO) Take 1 tablet by mouth daily.   nitroGLYCERIN (NITROSTAT) 0.4 MG SL tablet Place 1 tablet (0.4 mg total) under the tongue every 5 (five) minutes as needed for chest pain.   olmesartan (BENICAR) 40 MG tablet Take 40 mg by mouth daily.   phytonadione (VITAMIN K) 5 MG tablet Take 1 tablet by mouth daily.   QUERCETIN PO Take 1 tablet by mouth daily.   vitamin E 180 MG (400 UNITS) capsule Take 400 Units by mouth daily.     No Known Allergies  Social History   Socioeconomic History   Marital status: Married    Spouse name: Not on file   Number of children: Not on file   Years of education: Not on file   Highest education level: Not on file  Occupational History   Not on file  Tobacco Use   Smoking status: Former    Current packs/day: 0.00    Types: Cigarettes    Quit date: 08/16/1998    Years since quitting: 24.8   Smokeless tobacco: Never  Vaping Use  Vaping status: Never Used  Substance and Sexual Activity   Alcohol use: No   Drug use: No   Sexual activity: Not on file  Other Topics Concern   Not on file  Social History Narrative   Not on file   Social Determinants of Health   Financial Resource Strain: Not on file  Food Insecurity: No Food Insecurity (03/04/2023)   Hunger Vital Sign    Worried About Running Out of Food in the Last Year: Never true    Ran Out of Food in the Last Year: Never true  Transportation Needs: No Transportation Needs (03/04/2023)   PRAPARE - Administrator, Civil Service (Medical): No    Lack of Transportation (Non-Medical): No  Physical Activity: Not on file  Stress: Not on file  Social Connections: Not on file   Intimate Partner Violence: Not At Risk (03/04/2023)   Humiliation, Afraid, Rape, and Kick questionnaire    Fear of Current or Ex-Partner: No    Emotionally Abused: No    Physically Abused: No    Sexually Abused: No     Review of Systems: General: negative for chills, fever, night sweats or weight changes.  Cardiovascular: negative for chest pain, dyspnea on exertion, edema, orthopnea, palpitations, paroxysmal nocturnal dyspnea or shortness of breath Dermatological: negative for rash Respiratory: negative for cough or wheezing Urologic: negative for hematuria Abdominal: negative for nausea, vomiting, diarrhea, bright red blood per rectum, melena, or hematemesis Neurologic: negative for visual changes, syncope, or dizziness All other systems reviewed and are otherwise negative except as noted above.    Blood pressure (!) 162/80, pulse 75, height 5\' 8"  (1.727 m), weight 174 lb (78.9 kg), SpO2 98%.  General appearance: alert and no distress Neck: no adenopathy, no carotid bruit, no JVD, supple, symmetrical, trachea midline, and thyroid not enlarged, symmetric, no tenderness/mass/nodules Lungs: clear to auscultation bilaterally Heart: regular rate and rhythm, S1, S2 normal, no murmur, click, rub or gallop Extremities: extremities normal, atraumatic, no cyanosis or edema Pulses: 2+ and symmetric Skin: Skin color, texture, turgor normal. 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  He was recapped in 2005 at [facility] when he had "in-stent restenosis.  In December of last year which time he had nonobstructive disease and again most recently 02/22/2023 when he presented with inferior STEMI.  He had acute stent thrombosis and had PCI  by Dr. Eldridge Norton along with aspiration thrombectomy.  He does not restented.  He was late presenting.  His EF was in the 45 to 50% range which is chronic.  He remains on aspirin clopidogrel and has been asymptomatic.  I did review his cath films and had a question about whether or not he had a lesion in his left main which was not commented on  Claudication of lower extremity (HCC) History of PAD status post peripheral angiography by myself 08/15/2012 documentation of an occluded right common, external iliac artery and common femoral artery.  I placed 2 stents in his left common and external iliac artery.  He had a known occluded left SFA with three-vessel runoff.  The intent was to potentially consider femorofemoral crossover grafting which never occurred.  He currently denies claudication.  HTN (  hypertension) History of essential hypertension with blood pressure measured today at 162/80.  He is on metoprolol and Benicar.  I reviewed his log which he provided showed his blood pressures were in the 1 30-1 40 range at home.  Atrial fibrillation (HCC) History of atrial fibrillation in December but none since  HFrEF (heart failure with reduced ejection fraction) (HCC) History of heart failure with reduced EF.  His echocardiogram most recently performed 03/10/2023 revealed an EF of 40 to 45% which is similar to prior findings.  He had a small pericardial effusion without valvular abnormalities.  He is on a beta-blocker and an ARB.  He is asymptomatic.     Jeffrey Gess MD FACP,FACC,FAHA, [facility] 06/16/2023 10:16 AM

## 2023-06-16 NOTE — Assessment & Plan Note (Signed)
History of heart failure with reduced EF.  His echocardiogram most recently performed 03/10/2023 revealed an EF of 40 to 45% which is similar to prior findings.  He had a small pericardial effusion without valvular abnormalities.  He is on a beta-blocker and an ARB.  He is asymptomatic.

## 2023-06-16 NOTE — Assessment & Plan Note (Signed)
History of PAD status post peripheral angiography by myself 08/15/2012 documentation of an occluded right common, external iliac artery and common femoral artery.  I placed 2 stents in his left common and external iliac artery.  He had a known occluded left SFA with three-vessel runoff.  The intent was to potentially consider femorofemoral crossover grafting which never occurred.  He currently denies claudication.

## 2023-06-16 NOTE — Assessment & Plan Note (Signed)
History of atrial fibrillation in December but none since

## 2023-06-16 NOTE — Assessment & Plan Note (Signed)
History of essential hypertension with blood pressure measured today at 162/80.  He is on metoprolol and Benicar.  I reviewed his log which he provided showed his blood pressures were in the 1 30-1 40 range at home.

## 2023-07-02 DIAGNOSIS — I4891 Unspecified atrial fibrillation: Secondary | ICD-10-CM | POA: Diagnosis not present

## 2023-07-02 DIAGNOSIS — E785 Hyperlipidemia, unspecified: Secondary | ICD-10-CM | POA: Diagnosis not present

## 2023-07-02 DIAGNOSIS — I5023 Acute on chronic systolic (congestive) heart failure: Secondary | ICD-10-CM | POA: Diagnosis not present

## 2023-07-02 DIAGNOSIS — I509 Heart failure, unspecified: Secondary | ICD-10-CM | POA: Diagnosis not present

## 2023-07-12 ENCOUNTER — Ambulatory Visit (HOSPITAL_COMMUNITY): Payer: Medicare Other

## 2023-07-12 ENCOUNTER — Ambulatory Visit (HOSPITAL_COMMUNITY)
Admission: RE | Admit: 2023-07-12 | Payer: Medicare Other | Source: Ambulatory Visit | Attending: Cardiovascular Disease | Admitting: Cardiovascular Disease

## 2023-07-30 ENCOUNTER — Ambulatory Visit (HOSPITAL_COMMUNITY)
Admission: RE | Admit: 2023-07-30 | Discharge: 2023-07-30 | Disposition: A | Discharge status: Home / Self Care | Payer: Medicare Other | Source: Ambulatory Visit | Attending: Cardiovascular Disease | Admitting: Cardiovascular Disease

## 2023-07-30 ENCOUNTER — Ambulatory Visit (HOSPITAL_BASED_OUTPATIENT_CLINIC_OR_DEPARTMENT_OTHER)
Admission: RE | Admit: 2023-07-30 | Discharge: 2023-07-30 | Disposition: A | Discharge status: Home / Self Care | Payer: Medicare Other | Source: Ambulatory Visit | Attending: Cardiovascular Disease | Admitting: Cardiovascular Disease

## 2023-07-30 DIAGNOSIS — I739 Peripheral vascular disease, unspecified: Secondary | ICD-10-CM | POA: Diagnosis not present

## 2023-07-30 DIAGNOSIS — Z95820 Peripheral vascular angioplasty status with implants and grafts: Secondary | ICD-10-CM | POA: Diagnosis not present

## 2023-07-30 LAB — VAS US ABI WITH/WO TBI
Left ABI: 0.53
Right ABI: 0.37

## 2023-08-02 DIAGNOSIS — E785 Hyperlipidemia, unspecified: Secondary | ICD-10-CM | POA: Diagnosis not present

## 2023-08-02 DIAGNOSIS — I4891 Unspecified atrial fibrillation: Secondary | ICD-10-CM | POA: Diagnosis not present

## 2023-08-02 DIAGNOSIS — I509 Heart failure, unspecified: Secondary | ICD-10-CM | POA: Diagnosis not present

## 2023-08-02 DIAGNOSIS — I5023 Acute on chronic systolic (congestive) heart failure: Secondary | ICD-10-CM | POA: Diagnosis not present

## 2023-08-03 ENCOUNTER — Ambulatory Visit: Payer: Medicare Other | Attending: Cardiovascular Disease | Admitting: Cardiovascular Disease

## 2023-08-03 ENCOUNTER — Encounter: Payer: Self-pay | Admitting: Cardiovascular Disease

## 2023-08-03 VITALS — BP 158/72 | HR 83 | Ht 69.0 in | Wt 178.0 lb

## 2023-08-03 DIAGNOSIS — I739 Peripheral vascular disease, unspecified: Secondary | ICD-10-CM

## 2023-08-03 NOTE — Progress Notes (Signed)
 Mr. Klingensmith returns today to follow-up his peripheral vascular Doppler studies performed 07/30/2023.  He has had remote left iliac stents.  His ABI was 0.37 on the right and 0.53 on the left.  He had an occluded right common and external artery and high-grade left external iliac artery stenosis with an occluded left SFA.  He is completely asymptomatic.  Based on this I do not recommend any further evaluation.  Will get repeat Dopplers on him in 12 months.  Dorn DOROTHA Lesches, M.D., FACP, [facility], LYNITA [facility] [address] [place], [place]  [address]  [phone] 08/03/2023 1:42 PM

## 2023-08-03 NOTE — Patient Instructions (Signed)
 Medication Instructions:  Your physician recommends that you continue on your current medications as directed. Please refer to the Current Medication list given to you today.  *If you need a refill on your cardiac medications before your next appointment, please call your pharmacy*   Testing/Procedures: Your physician has requested that you have an Aorta/Iliac Duplex. This will be take place at [address], [address].  No food after 11PM the night before.  Water is OK. (Don't drink liquids if you have been instructed not to for ANOTHER test) Avoid foods that produce bowel gas, for 24 hours prior to exam (see below). No breakfast, no chewing gum, no smoking or carbonated beverages. Patient may take morning medications with water. Come in for test at least 15 minutes early to register. **To do in January 2026**  Please note: We ask at that you not bring children with you during ultrasound (echo/ vascular) testing. Due to room size and safety concerns, children are not allowed in the ultrasound rooms during exams. Our front office staff cannot provide observation of children in our lobby area while testing is being conducted. An adult accompanying a patient to their appointment will only be allowed in the ultrasound room at the discretion of the ultrasound technician under special circumstances. We apologize for any inconvenience.  Your physician has requested that you have a lower extremity arterial duplex. During this test, ultrasound is used to evaluate arterial blood flow in the legs. Allow one hour for this exam. There are no restrictions or special instructions. This will take place at [address], [address]. **To do in January 2026**  Please note: We ask at that you not bring children with you during ultrasound (echo/ vascular) testing. Due to room size and safety concerns, children are not allowed in the ultrasound rooms during exams. Our front office staff cannot provide  observation of children in our lobby area while testing is being conducted. An adult accompanying a patient to their appointment will only be allowed in the ultrasound room at the discretion of the ultrasound technician under special circumstances. We apologize for any inconvenience.  Your physician has requested that you have an ankle brachial index (ABI). During this test an ultrasound and blood pressure cuff are used to evaluate the arteries that supply the arms and legs with blood. Allow thirty minutes for this exam. There are no restrictions or special instructions. This will take place at [address], [address].  **To do in January 2026**   Please note: We ask at that you not bring children with you during ultrasound (echo/ vascular) testing. Due to room size and safety concerns, children are not allowed in the ultrasound rooms during exams. Our front office staff cannot provide observation of children in our lobby area while testing is being conducted. An adult accompanying a patient to their appointment will only be allowed in the ultrasound room at the discretion of the ultrasound technician under special circumstances. We apologize for any inconvenience.    Follow-Up: At [facility], you and your health needs are our priority.  As part of our continuing mission to provide you with exceptional heart care, we have created designated Provider Care Teams.  These Care Teams include your primary Cardiologist (physician) and Advanced Practice Providers (APPs -  Physician Assistants and Nurse Practitioners) who all work together to provide you with the care you need, when you need it.  We recommend signing up for the patient portal called MyChart.  Sign up information is provided on this After Visit Summary.  MyChart is used to connect with patients for Virtual Visits (Telemedicine).  Patients are able to view lab/test results, encounter notes, upcoming appointments, etc.  Non-urgent  messages can be sent to your provider as well.   To learn more about what you can do with MyChart, go to [email or web address].    Your next appointment:   12 month(s)  Provider:   Dorn Lesches, MD

## 2023-08-20 DIAGNOSIS — I10 Essential (primary) hypertension: Secondary | ICD-10-CM | POA: Diagnosis not present

## 2023-08-20 DIAGNOSIS — I2510 Atherosclerotic heart disease of native coronary artery without angina pectoris: Secondary | ICD-10-CM | POA: Diagnosis not present

## 2023-09-02 DIAGNOSIS — I4891 Unspecified atrial fibrillation: Secondary | ICD-10-CM | POA: Diagnosis not present

## 2023-09-02 DIAGNOSIS — I509 Heart failure, unspecified: Secondary | ICD-10-CM | POA: Diagnosis not present

## 2023-09-02 DIAGNOSIS — E785 Hyperlipidemia, unspecified: Secondary | ICD-10-CM | POA: Diagnosis not present

## 2023-09-02 DIAGNOSIS — I5023 Acute on chronic systolic (congestive) heart failure: Secondary | ICD-10-CM | POA: Diagnosis not present

## 2023-11-05 DIAGNOSIS — R1032 Left lower quadrant pain: Secondary | ICD-10-CM | POA: Diagnosis not present

## 2023-11-05 DIAGNOSIS — I10 Essential (primary) hypertension: Secondary | ICD-10-CM | POA: Diagnosis not present

## 2024-05-25 ENCOUNTER — Emergency Department (HOSPITAL_BASED_OUTPATIENT_CLINIC_OR_DEPARTMENT_OTHER)

## 2024-05-25 ENCOUNTER — Encounter (HOSPITAL_BASED_OUTPATIENT_CLINIC_OR_DEPARTMENT_OTHER): Payer: Self-pay

## 2024-05-25 ENCOUNTER — Other Ambulatory Visit: Payer: Self-pay

## 2024-05-25 ENCOUNTER — Emergency Department (HOSPITAL_BASED_OUTPATIENT_CLINIC_OR_DEPARTMENT_OTHER)
Admission: EM | Admit: 2024-05-25 | Discharge: 2024-05-25 | Discharge status: Against Medical Advice | Source: Ambulatory Visit | Attending: Emergency Medicine | Admitting: Emergency Medicine

## 2024-05-25 DIAGNOSIS — Z5329 Procedure and treatment not carried out because of patient's decision for other reasons: Secondary | ICD-10-CM | POA: Insufficient documentation

## 2024-05-25 DIAGNOSIS — E875 Hyperkalemia: Secondary | ICD-10-CM | POA: Diagnosis not present

## 2024-05-25 DIAGNOSIS — J181 Lobar pneumonia, unspecified organism: Secondary | ICD-10-CM | POA: Diagnosis not present

## 2024-05-25 DIAGNOSIS — R042 Hemoptysis: Secondary | ICD-10-CM | POA: Diagnosis not present

## 2024-05-25 DIAGNOSIS — J189 Pneumonia, unspecified organism: Secondary | ICD-10-CM

## 2024-05-25 DIAGNOSIS — Z8616 Personal history of COVID-19: Secondary | ICD-10-CM | POA: Diagnosis not present

## 2024-05-25 DIAGNOSIS — Z7982 Long term (current) use of aspirin: Secondary | ICD-10-CM | POA: Diagnosis not present

## 2024-05-25 DIAGNOSIS — R000 Tachycardia, unspecified: Secondary | ICD-10-CM | POA: Diagnosis not present

## 2024-05-25 DIAGNOSIS — R918 Other nonspecific abnormal finding of lung field: Secondary | ICD-10-CM | POA: Diagnosis not present

## 2024-05-25 DIAGNOSIS — I509 Heart failure, unspecified: Secondary | ICD-10-CM | POA: Insufficient documentation

## 2024-05-25 DIAGNOSIS — I130 Hypertensive heart and chronic kidney disease with heart failure and stage 1 through stage 4 chronic kidney disease, or unspecified chronic kidney disease: Secondary | ICD-10-CM | POA: Insufficient documentation

## 2024-05-25 DIAGNOSIS — J984 Other disorders of lung: Secondary | ICD-10-CM | POA: Diagnosis not present

## 2024-05-25 DIAGNOSIS — J168 Pneumonia due to other specified infectious organisms: Secondary | ICD-10-CM | POA: Diagnosis not present

## 2024-05-25 DIAGNOSIS — N189 Chronic kidney disease, unspecified: Secondary | ICD-10-CM | POA: Diagnosis not present

## 2024-05-25 DIAGNOSIS — Z79899 Other long term (current) drug therapy: Secondary | ICD-10-CM | POA: Diagnosis not present

## 2024-05-25 LAB — CBC WITH DIFFERENTIAL/PLATELET
Abs Immature Granulocytes: 0.07 K/uL (ref 0.00–0.07)
Basophils Absolute: 0.1 K/uL (ref 0.0–0.1)
Basophils Relative: 0 %
Eosinophils Absolute: 0.1 K/uL (ref 0.0–0.5)
Eosinophils Relative: 1 %
HCT: 49.7 % (ref 39.0–52.0)
Hemoglobin: 15.8 g/dL (ref 13.0–17.0)
Immature Granulocytes: 0 %
Lymphocytes Relative: 11 %
Lymphs Abs: 1.8 K/uL (ref 0.7–4.0)
MCH: 30.5 pg (ref 26.0–34.0)
MCHC: 31.8 g/dL (ref 30.0–36.0)
MCV: 95.9 fL (ref 80.0–100.0)
Monocytes Absolute: 1.1 K/uL — ABNORMAL HIGH (ref 0.1–1.0)
Monocytes Relative: 7 %
Neutro Abs: 12.8 K/uL — ABNORMAL HIGH (ref 1.7–7.7)
Neutrophils Relative %: 81 %
Platelets: 379 K/uL (ref 150–400)
RBC: 5.18 MIL/uL (ref 4.22–5.81)
RDW: 14.3 % (ref 11.5–15.5)
WBC: 16 K/uL — ABNORMAL HIGH (ref 4.0–10.5)
nRBC: 0 % (ref 0.0–0.2)

## 2024-05-25 LAB — BASIC METABOLIC PANEL WITH GFR
Anion gap: 10 (ref 5–15)
BUN: 23 mg/dL (ref 8–23)
CO2: 27 mmol/L (ref 22–32)
Calcium: 9.7 mg/dL (ref 8.9–10.3)
Chloride: 97 mmol/L — ABNORMAL LOW (ref 98–111)
Creatinine, Ser: 1.6 mg/dL — ABNORMAL HIGH (ref 0.61–1.24)
GFR, Estimated: 45 mL/min — ABNORMAL LOW (ref >60)
Glucose, Bld: 90 mg/dL (ref 70–99)
Potassium: 6.1 mmol/L — ABNORMAL HIGH (ref 3.5–5.1)
Sodium: 134 mmol/L — ABNORMAL LOW (ref 135–145)

## 2024-05-25 LAB — POTASSIUM: Potassium: 5.5 mmol/L — ABNORMAL HIGH (ref 3.5–5.1)

## 2024-05-25 MED ORDER — DOXYCYCLINE HYCLATE 100 MG PO CAPS: 100.0000 mg | ORAL_CAPSULE | Freq: Two times a day (BID) | ORAL | 0 refills | Status: DC

## 2024-05-25 MED ORDER — SODIUM CHLORIDE 0.9 % IV SOLN
1.0000 g | Freq: Once | INTRAVENOUS | Status: AC
  Administered 2024-05-25: 1 g via INTRAVENOUS
  Filled 2024-05-25: qty 10

## 2024-05-25 MED ORDER — AMOXICILLIN-POT CLAVULANATE 875-125 MG PO TABS: 1.0000 | ORAL_TABLET | Freq: Two times a day (BID) | ORAL | 0 refills | Status: DC

## 2024-05-25 MED ORDER — IOHEXOL 350 MG/ML SOLN
100.0000 mL | Freq: Once | INTRAVENOUS | Status: AC | PRN
  Administered 2024-05-25: 100 mL via INTRAVENOUS

## 2024-05-25 MED ORDER — SODIUM CHLORIDE 0.9 % IV SOLN
500.0000 mg | Freq: Once | INTRAVENOUS | Status: AC
  Administered 2024-05-25: 500 mg via INTRAVENOUS
  Filled 2024-05-25: qty 5

## 2024-05-25 NOTE — ED Triage Notes (Signed)
 Pt reports coughing up blood this AM. Pt reports dry cough for several years after having COVID. Pt describes blood as dark red. Pt denies any SOB.

## 2024-05-25 NOTE — Discharge Instructions (Addendum)
 You have chosen to leave AGAINST MEDICAL ADVICE. Should you change your mind, you are always welcome and encouraged to return to the ED. You are encouraged to follow-up with, at the very least, a primary care provider, or other similar medical professional on this matter.   You have been prescribed oral antibiotics for your pneumonia.  Please follow-up with your primary care soon as possible for further evaluation and treatment of your lung mass and pneumonia.

## 2024-05-25 NOTE — ED Provider Notes (Signed)
 [place] [facility] AT [facility] Provider Note   CSN: [id number] Arrival date & time: 05/25/24  1456     Patient presents with: Hemoptysis   Jeffrey Norton is a 73 y.o. male has medical history significant for MI, hypertension, CKD, CHF, and A-fib presents today for hemoptysis that began this morning.  Patient reports dry cough for many years after having COVID.  Patient denies fever, chills, nausea, vomiting, shortness of breath, chest pain, any other complaints at this time.  Patient was sent by his PCP for concern of carcinoma.  Patient on 2 L nasal cannula and states he is on oxygen  at home but is unsure of how much.  Patient hypoxic to 85% on room air.   HPI     Prior to Admission medications   Medication Sig Start Date End Date Taking? Authorizing Provider  amLODipine  (NORVASC ) 5 MG tablet Take 5 mg by mouth daily. 09/02/23  Yes [provider]  amoxicillin-clavulanate (AUGMENTIN) 875-125 MG tablet Take 1 tablet by mouth every 12 (twelve) hours. 05/25/24  Yes Margaret Cockerill N, PA-C  doxycycline  (VIBRAMYCIN ) 100 MG capsule Take 1 capsule (100 mg total) by mouth 2 (two) times daily. 05/25/24  Yes Francis Ileana SAILOR, PA-C  APPLE CIDER VINEGAR PO Take 1 Dose by mouth See admin instructions. 2 teaspoons honey + apple cider vinegar to thin, every 6 hours as needed for cough.    [provider]  Ascorbic Acid (VITAMIN C PO) Take 1 tablet by mouth daily.    [provider]  aspirin  EC 81 MG tablet Take 81 mg by mouth every other day.    [provider]  b complex vitamins capsule Take 1 capsule by mouth daily.    [provider]  clopidogrel  (PLAVIX ) 75 MG tablet Take 1 tablet (75 mg total) by mouth daily. 06/08/23   Court Dorn PARAS, MD  Coenzyme Q10 (COQ10 PO) Take 1 capsule by mouth daily.    [provider]  colchicine  0.6 MG tablet Take 1 tablet (0.6 mg total) by mouth daily. Patient not taking: Reported on  08/03/2023 03/06/23 03/05/24  Pokhrel, Laxman, MD  ergocalciferol (VITAMIN D2) 1.25 MG (50000 UT) capsule Take 50,000 Units by mouth daily.    [provider]  furosemide  (LASIX ) 40 MG tablet Take 1 tablet (40 mg total) by mouth daily. As needed for fluid in legs, weight gain 03/06/23 03/05/24  Pokhrel, Vernal, MD  MAGNESIUM PO Take 1 tablet by mouth daily.    [provider]  metoprolol  succinate (TOPROL -XL) 25 MG 24 hr tablet Take 1 tablet (25 mg total) by mouth daily. 06/08/23   Court Dorn PARAS, MD  Multiple Vitamins-Minerals (ZINC PO) Take 1 tablet by mouth daily.    [provider]  nitroGLYCERIN  (NITROSTAT ) 0.4 MG SL tablet Place 1 tablet (0.4 mg total) under the tongue every 5 (five) minutes as needed for chest pain. 07/08/22   Henry Manuelita NOVAK, NP  olmesartan (BENICAR) 40 MG tablet Take 40 mg by mouth daily. 05/26/23   [provider]  pantoprazole  (PROTONIX ) 40 MG tablet Take 1 tablet (40 mg total) by mouth daily. 03/06/23 03/05/24  Pokhrel, Laxman, MD  phytonadione (VITAMIN K) 5 MG tablet Take 1 tablet by mouth daily.    [provider]  QUERCETIN PO Take 1 tablet by mouth daily.    [provider]  vitamin E 180 MG (400 UNITS) capsule Take 400 Units by mouth daily.    [provider]    Allergies: Patient has no known allergies.    Review of Systems  Respiratory:         Hemoptysis    Updated Vital Signs BP (!) 156/77   Pulse 86   Temp 98.5 F (36.9 C) (Oral)   Resp 19   Ht 5' 9 (1.753 m)   Wt 77.1 kg   SpO2 93%   BMI 25.10 kg/m   Physical Exam Vitals and nursing note reviewed.  Constitutional:      General: He is not in acute distress.    Appearance: Normal appearance. He is well-developed. He is not toxic-appearing.     Comments: Chronically ill-appearing  HENT:     Head: Normocephalic and atraumatic.     Right Ear: External ear normal.     Left Ear: External ear normal.  Eyes:     Conjunctiva/sclera:  Conjunctivae normal.  Cardiovascular:     Rate and Rhythm: Normal rate and regular rhythm.     Pulses: Normal pulses.     Heart sounds: Normal heart sounds. No murmur heard. Pulmonary:     Effort: Pulmonary effort is normal. No respiratory distress.     Breath sounds: Normal breath sounds.     Comments: Few expiratory wheezes noted in right lower lobe Abdominal:     Palpations: Abdomen is soft.     Tenderness: There is no abdominal tenderness.  Musculoskeletal:        General: No swelling.     Cervical back: Neck supple.  Skin:    General: Skin is warm and dry.     Capillary Refill: Capillary refill takes less than 2 seconds.  Neurological:     Mental Status: He is alert.  Psychiatric:        Mood and Affect: Mood normal.     (all labs ordered are listed, but only abnormal results are displayed) Labs Reviewed  BASIC METABOLIC PANEL WITH GFR - Abnormal; Notable for the following components:      Result Value   Sodium 134 (*)    Potassium 6.1 (*)    Chloride 97 (*)    Creatinine, Ser 1.60 (*)    GFR, Estimated 45 (*)    All other components within normal limits  CBC WITH DIFFERENTIAL/PLATELET - Abnormal; Notable for the following components:   WBC 16.0 (*)    Neutro Abs 12.8 (*)    Monocytes Absolute 1.1 (*)    All other components within normal limits  POTASSIUM - Abnormal; Notable for the following components:   Potassium 5.5 (*)    All other components within normal limits    EKG: EKG Interpretation Date/Time:  Thursday May 25 2024 15:16:06 EDT Ventricular Rate:  103 PR Interval:  133 QRS Duration:  116 QT Interval:  341 QTC Calculation: 447 R Axis:   186  Text Interpretation: Sinus tachycardia Probable left atrial enlargement Nonspecific intraventricular conduction delay Inferior infarct, old Anterior infarct, old when compared top rior, similar appearance with faster rate No STEMI Confirmed by Ginger Barefoot ([id number]) on 05/25/2024 5:16:11  PM  Radiology: CT Angio Chest PE W and/or Wo Contrast Result Date: 05/25/2024 EXAM: CTA of the Chest without and with contrast for PE 05/25/2024 04:52:24 PM TECHNIQUE: CTA of the chest was performed without and with the administration of 100 mL of iohexol  (OMNIPAQUE ) 350 MG/ML injection. Multiplanar reformatted images are provided for review. MIP images are provided for review. Automated exposure control, iterative reconstruction, and/or weight based adjustment of the mA/kV  was utilized to reduce the radiation dose to as low as reasonably achievable. COMPARISON: Chest x-ray and chest CT 03/04/2023. CLINICAL HISTORY: Pulmonary embolism (PE) suspected, high prob. FINDINGS: PULMONARY ARTERIES: Pulmonary arteries are adequately opacified for evaluation. No pulmonary embolism. Main pulmonary artery is normal in caliber. MEDIASTINUM: Cardiomegaly. Multivessel coronary vascular calcification. No significant pericardial effusion. Moderate aortic atherosclerosis. Mild aneurysmal dilatation of the ascending aorta measuring up to 4.1 cm. Patent trachea. Esophagus is within normal limits. LYMPH NODES: Mildly enlarged right paratracheal node measuring up to 15 mm. Subcarinal nodes measuring up to 11 mm. No axillary lymphadenopathy. LUNGS AND PLEURA: Large solid right upper lobe lung mass measuring 11.5 x 8.5 x 8.8 cm (AP x transverse x craniocaudal). Underlying emphysema and chronic lung disease with subpleural reticulation. Scarring at the lingula. Heterogeneous ground-glass opacity at the posterior periphery of the dominant right upper lobe lung mass. Small subpleural right upper lobe pulmonary nodules measuring up to 3 mm, for example, series 6 image 79 and 36 image 83. Negative for pleural effusion or pneumothorax. UPPER ABDOMEN: Hepatic steatosis. Interval growth of right adrenal mass now measuring 3.6 x 3.6 cm, previously 2.5 cm. SOFT TISSUES AND BONES: No acute bone or soft tissue abnormality. No thyroid  mass.  IMPRESSION: 1. No evidence of pulmonary embolism. 2. Large right upper lobe lung mass, suspicious for primary malignancy. Multidisciplinary thoracic consultation is recommended. Surrounding ground glass disease could reflect pneumonia. 3. Small subpleural right upper lobe pulmonary nodules measuring up to 3 mm. Attention on follow-up imaging. 4. Mild mediastinal lymphadenopathy. This is indeterminate for metastatic disease 5. Mild ascending aortic aneurysm measuring up to 4.1 cm. 6. 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 Electronically signed by: Luke Bun MD 05/25/2024 05:17 PM EDT RP Workstation: [id number]     Procedures   Medications Ordered in the ED  cefTRIAXone (ROCEPHIN) 1 g in sodium chloride  0.9 % 100 mL IVPB (has no administration in time range)  azithromycin (ZITHROMAX) 500 mg in sodium chloride  0.9 % 250 mL IVPB (has no administration in time range)  iohexol  (OMNIPAQUE ) 350 MG/ML injection 100 mL (100 mLs Intravenous Contrast Given 05/25/24 1647)                                    Medical Decision Making Amount and/or Complexity of Data Reviewed Labs: ordered. Radiology: ordered.  Risk Prescription drug management.   This patient presents to the ED for concern of hemoptysis differential diagnosis includes pulmonary carcinoma, pneumonia, PE, bronchitis    Additional history obtained   Additional history obtained from Electronic Medical Record External records from outside source obtained and reviewed including Care Everywhere   Lab Tests:  I Ordered, and personally interpreted labs.  The pertinent results include: Leukocytosis at 16.0, mild hyponatremia 134, hyperkalemia 6.1, elevated creatinine at 1.6 which is around patient's baseline   Imaging Studies ordered:  I ordered imaging studies  including CT angio PE I independently visualized and interpreted imaging which showed no evidence of PE.  Large right upper lobe lung mass, suspicious for primary malignancy.  Multidisciplinary thoracic consultation is recommended.  Surrounding ground glass disease could reflect pneumonia.  Small subpleural right upper lobe pulmonary nodules measuring up to 3 mm.  Mild mediastinal lymphadenopathy.  This is indeterminate for metastatic  disease.  Mild ascending aortic aneurysm measuring up to 4.1 cm.  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 I agree with the radiologist interpretation   Medicines ordered and prescription drug management:  I ordered medication including ceftriaxone and azithromycin    I have reviewed the patients home medicines and have made adjustments as needed   Problem List / ED Course:  Consulted Oncology, Dr. Lonn who stated that hospitalist would typically consult pulmonology for biopsy and based off of that result oncology would get involved in patient care. Discussed with patient admission for lung mass and presumed pneumonia.  Patient is declining admission at this time.  Discussed with patient risks of not being admitted and that he will have to be discharged AMA.  Patient will be given outpatient antibiotic treatment for pneumonia.  Patient advised to have immediate follow-up with his primary care for further evaluation and treatment of his lung mass and pneumonia.  Patient has decision-making capacity. Patient wishes to leave AGAINST MEDICAL ADVICE. I personally explained need for further testing and my concerns for adverse outcomes if workup is incomplete. Specific concerns explained to patient include worsening symptoms, functional loss, long term sickness and death. Patient states understanding of risks  and states they will return if they feel the need to at a later date to receive the recommended care or any other care at any time, regardless of their ability to pay for such care. Patient understands they are able to return at any time. Patient is able to explain back the risks of leaving AMA and still wishes to leave.   Specifically I recommend admission to further evaluation and treatment of large lung mass and pneumonia.  The patient is oriented to person, place, and time, has the capacity to make decisions regarding the medical care offered. The patient speaks coherently and exhibits no evidence of having an altered level of consciousness or alcohol or drug intoxication to a point that would impair judgment. They respond knowingly to questions about recommended treatment and alternate treatments including no further testing or treatment; participate in diagnostic and treatment decisions by means of rational thought processes; and understand the items of minimum basic medical treatment information with respect to that treatment (the nature and seriousness of the illness, the nature of the treatment, the probable degree and duration of any benefits and risks of any medical intervention that is being recommended, and the consequences of lack of treatment, and the nature, risks, and benefits of any reasonable alternatives).  The patient understands the relevant information of the nature of their medical condition, as well as the risks, benefits, and treatment alternatives (including non-treatment), consequences of refusing care, and can competently communicate a rational explanation about their choice of care options.    Included in AVS was the following message:  You have chosen to leave AGAINST MEDICAL ADVICE. Should you change your mind, you are always welcome and encouraged to return to the ED. You are encouraged to follow-up with, at the very least, a primary care provider, or other similar  medical professional on this matter.        Final diagnoses:  Hyperkalemia  Lung mass  Pneumonia due to infectious organism, unspecified laterality, unspecified part of lung    ED Discharge Orders          Ordered    amoxicillin-clavulanate (AUGMENTIN)  875-125 MG tablet  Every 12 hours        05/25/24 1817    doxycycline  (VIBRAMYCIN ) 100 MG capsule  2 times daily        05/25/24 1817               Francis Ileana SAILOR, PA-C 05/25/24 1818    Tegeler, Lonni PARAS, MD 06/02/24 [phone]

## 2024-05-25 NOTE — ED Notes (Signed)
 Blood work sent to lab.

## 2024-06-08 ENCOUNTER — Encounter (HOSPITAL_COMMUNITY): Payer: Self-pay

## 2024-06-15 DIAGNOSIS — D582 Other hemoglobinopathies: Secondary | ICD-10-CM | POA: Diagnosis not present

## 2024-06-15 DIAGNOSIS — R918 Other nonspecific abnormal finding of lung field: Secondary | ICD-10-CM | POA: Diagnosis not present

## 2024-06-15 DIAGNOSIS — I2510 Atherosclerotic heart disease of native coronary artery without angina pectoris: Secondary | ICD-10-CM | POA: Diagnosis not present

## 2024-06-15 DIAGNOSIS — Z0000 Encounter for general adult medical examination without abnormal findings: Secondary | ICD-10-CM | POA: Diagnosis not present

## 2024-06-15 DIAGNOSIS — Z125 Encounter for screening for malignant neoplasm of prostate: Secondary | ICD-10-CM | POA: Diagnosis not present

## 2024-06-20 ENCOUNTER — Other Ambulatory Visit: Payer: Self-pay | Admitting: Cardiovascular Disease

## 2024-06-20 DIAGNOSIS — I739 Peripheral vascular disease, unspecified: Secondary | ICD-10-CM

## 2024-06-21 DIAGNOSIS — R972 Elevated prostate specific antigen [PSA]: Secondary | ICD-10-CM | POA: Diagnosis not present

## 2024-06-21 DIAGNOSIS — E875 Hyperkalemia: Secondary | ICD-10-CM | POA: Diagnosis not present

## 2024-07-03 ENCOUNTER — Ambulatory Visit

## 2024-07-03 VITALS — BP 116/58 | HR 93 | Temp 97.5°F | Ht 69.0 in | Wt 158.0 lb

## 2024-07-03 DIAGNOSIS — J449 Chronic obstructive pulmonary disease, unspecified: Secondary | ICD-10-CM | POA: Diagnosis not present

## 2024-07-03 DIAGNOSIS — J9611 Chronic respiratory failure with hypoxia: Secondary | ICD-10-CM

## 2024-07-03 DIAGNOSIS — R918 Other nonspecific abnormal finding of lung field: Secondary | ICD-10-CM

## 2024-07-03 DIAGNOSIS — R0602 Shortness of breath: Secondary | ICD-10-CM

## 2024-07-03 DIAGNOSIS — I2510 Atherosclerotic heart disease of native coronary artery without angina pectoris: Secondary | ICD-10-CM

## 2024-07-03 DIAGNOSIS — J849 Interstitial pulmonary disease, unspecified: Secondary | ICD-10-CM

## 2024-07-03 DIAGNOSIS — R590 Localized enlarged lymph nodes: Secondary | ICD-10-CM

## 2024-07-03 DIAGNOSIS — Z87891 Personal history of nicotine dependence: Secondary | ICD-10-CM

## 2024-07-03 DIAGNOSIS — R911 Solitary pulmonary nodule: Secondary | ICD-10-CM

## 2024-07-03 DIAGNOSIS — R053 Chronic cough: Secondary | ICD-10-CM

## 2024-07-03 MED ORDER — BREZTRI AEROSPHERE 160-9-4.8 MCG/ACT IN AERO: 2.0000 | INHALATION_SPRAY | Freq: Two times a day (BID) | RESPIRATORY_TRACT | 3 refills | Status: DC

## 2024-07-03 MED ORDER — GUAIFENESIN-CODEINE 100-10 MG/5ML PO SOLN: 5.0000 mL | Freq: Every evening | ORAL | 1 refills | Status: DC | PRN

## 2024-07-03 MED ORDER — IPRATROPIUM-ALBUTEROL 0.5-2.5 (3) MG/3ML IN SOLN: 3.0000 mL | RESPIRATORY_TRACT | 5 refills | Status: DC | PRN

## 2024-07-03 NOTE — Patient Instructions (Signed)
  VISIT SUMMARY: During today's visit, we evaluated your large right lung mass and discussed the necessary steps for further diagnosis and management. We also addressed your chronic cough, respiratory failure with hypoxia, and COPD. You were provided with new prescriptions and instructions to help manage your symptoms and improve your quality of life.  YOUR PLAN: -RIGHT LUNG MASS, SUSPICIOUS FOR MALIGNANCY: A large mass in your right lung is highly suspicious for lung cancer. We need to perform a biopsy to confirm the diagnosis. A PET scan has also been ordered to check if the cancer has spread. Please stop taking Plavix  five days before the biopsy.  -CHRONIC COUGH: Your persistent cough may be related to a past COVID-19 infection. We do not recommend using hydrogen peroxide for nebulization. Instead, you have been prescribed nebulized albuterol  and ipratropium (DuoNeb) and a cough suppressant with codeine  for nighttime. An inhaler has also been provided to help manage your symptoms.  -CHRONIC RESPIRATORY FAILURE WITH HYPOXIA: Chronic respiratory failure with hypoxia means your lungs are not getting enough oxygen , requiring you to use supplemental oxygen  at home. We have requested a portable oxygen  machine for your home use to help maintain your oxygen  levels.  -CHRONIC OBSTRUCTIVE PULMONARY DISEASE (COPD): COPD is a chronic lung disease that makes it hard to breathe. You have been prescribed nebulized albuterol  and ipratropium (DuoNeb) and provided with an inhaler to help manage your symptoms.  INSTRUCTIONS: Please follow up with the interventional radiology department for your biopsy appointment. Remember to stop taking Plavix  five days before the biopsy. Additionally, ensure you have a portable oxygen  machine at home to help with your oxygen  levels. Continue using the prescribed medications as directed and monitor your symptoms. If you experience any worsening of your condition, contact our office  immediately.

## 2024-07-03 NOTE — Progress Notes (Signed)
 Subjective:   PATIENT ID: Jeffrey Norton GENDER: male DOB: [birth year], [name]   HPI Discussed the use of AI scribe software for clinical note transcription with the patient, who gave verbal consent to proceed.  History of Present Illness Jeffrey Norton is a 73 year old male with a large right lung mass who presents for evaluation and biopsy. He was referred by Dr. Dayna for evaluation of a lung mass.  He has been experiencing low oxygen  levels, necessitating the use of supplemental oxygen  at home. He has a stationary oxygen  machine but lacks a portable oxygen  tank. His oxygen  levels were dangerously low, in the low eighties, prompting the use of a portable tank during the visit.  The patient reports that his primary care doctor told him that the original scan showed a mass about an inch in size, and a second scan two months ago showed it to be about six inches. He has not been under the care of a pulmonologist prior to this visit.  He has a history of three heart attacks, the most recent occurring in July of the previous year, which led to the initial use of home oxygen . He denies current smoking, having quit 25 years ago after a 30-year history of smoking Marlboro Reds.  He experiences occasional fluid buildup, primarily in his feet, for which he has used Lasix  approximately four times.  He has been experiencing a persistent cough for about four years, which began after contracting COVID-19 in September 2021. No heartburn or acid reflux, but he reports a constantly runny nose without postnasal drip. He has been using a nebulizer with food-grade hydrogen peroxide, which provides some relief.  He is not currently using any inhalers for COPD but has been nebulizing with hydrogen peroxide. He has been using this treatment based on personal research and advice from a book.     Past Medical History:  Diagnosis Date   Coronary artery disease    Hyperlipidemia     Hypertension    Myocardial infarction [facility])    PVD (peripheral vascular disease)    S/P angioplasty with stent to Lt. iliac 08/15/12 08/15/2012     Family History  Problem Relation Age of Onset   Diabetes Mother    Heart disease Mother 58   Hyperlipidemia Mother    Kidney disease Mother    Heart disease Father 52   Cancer Father 33   Stroke Maternal Grandfather        Mid 36s   Lung disease Paternal Grandfather        Black Lung Disease     Social History   Socioeconomic History   Marital status: Married    Spouse name: Not on file   Number of children: Not on file   Years of education: Not on file   Highest education level: Not on file  Occupational History   Not on file  Tobacco Use   Smoking status: Former    Current packs/day: 0.00    Types: Cigarettes    Quit date: 08/16/1998    Years since quitting: 25.8   Smokeless tobacco: Never  Vaping Use   Vaping status: Never Used  Substance and Sexual Activity   Alcohol use: No   Drug use: No   Sexual activity: Not on file  Other Topics Concern   Not on file  Social History Narrative   Not on file   Social Drivers of Health   Financial Resource Strain: Not on file  Food Insecurity: No Food Insecurity (03/04/2023)   Hunger Vital Sign    Worried About Running Out of Food in the Last Year: Never true    Ran Out of Food in the Last Year: Never true  Transportation Needs: No Transportation Needs (03/04/2023)   PRAPARE - Administrator, Civil Service (Medical): No    Lack of Transportation (Non-Medical): No  Physical Activity: Not on file  Stress: Not on file  Social Connections: Not on file  Intimate Partner Violence: Not At Risk (03/04/2023)   Humiliation, Afraid, Rape, and Kick questionnaire    Fear of Current or Ex-Partner: No    Emotionally Abused: No    Physically Abused: No    Sexually Abused: No     Allergies  Allergen Reactions   Heparin      bleeding     Outpatient Medications Prior to  Visit  Medication Sig Dispense Refill   APPLE CIDER VINEGAR PO Take 1 Dose by mouth See admin instructions. 2 teaspoons honey + apple cider vinegar to thin, every 6 hours as needed for cough.     Ascorbic Acid (VITAMIN C PO) Take 1 tablet by mouth daily.     aspirin  EC 81 MG tablet Take 81 mg by mouth every other day.     b complex vitamins capsule Take 1 capsule by mouth daily.     clopidogrel  (PLAVIX ) 75 MG tablet Take 1 tablet (75 mg total) by mouth daily. 90 tablet 3   Coenzyme Q10 (COQ10 PO) Take 1 capsule by mouth daily.     colchicine  0.6 MG tablet Take 1 tablet (0.6 mg total) by mouth daily. 30 tablet 2   ergocalciferol (VITAMIN D2) 1.25 MG (50000 UT) capsule Take 50,000 Units by mouth daily.     furosemide  (LASIX ) 40 MG tablet Take 1 tablet (40 mg total) by mouth daily. As needed for fluid in legs, weight gain 30 tablet 0   MAGNESIUM PO Take 1 tablet by mouth daily.     metoprolol  succinate (TOPROL -XL) 25 MG 24 hr tablet Take 1 tablet (25 mg total) by mouth daily. 90 tablet 3   Multiple Vitamins-Minerals (ZINC PO) Take 1 tablet by mouth daily.     nitroGLYCERIN  (NITROSTAT ) 0.4 MG SL tablet Place 1 tablet (0.4 mg total) under the tongue every 5 (five) minutes as needed for chest pain. 25 tablet 1   olmesartan (BENICAR) 40 MG tablet Take 40 mg by mouth daily.     pantoprazole  (PROTONIX ) 40 MG tablet Take 1 tablet (40 mg total) by mouth daily. 30 tablet 2   phytonadione (VITAMIN K) 5 MG tablet Take 1 tablet by mouth daily.     QUERCETIN PO Take 1 tablet by mouth daily.     vitamin E 180 MG (400 UNITS) capsule Take 400 Units by mouth daily.     amLODipine  (NORVASC ) 5 MG tablet Take 5 mg by mouth daily. (Patient not taking: Reported on 07/03/2024)     amoxicillin -clavulanate (AUGMENTIN ) 875-125 MG tablet Take 1 tablet by mouth every 12 (twelve) hours. 14 tablet 0   doxycycline  (VIBRAMYCIN ) 100 MG capsule Take 1 capsule (100 mg total) by mouth 2 (two) times daily. 10 capsule 0   No  facility-administered medications prior to visit.    ROS Reviewed all systems and reported negative except as above     Objective:   Vitals:   07/03/24 0948 07/03/24 0952  BP: (!) 116/58   Pulse: 93   Temp: (!) 97.5 F (  36.4 C)   TempSrc: Oral   SpO2: (!) 80% 92%  Weight: 158 lb (71.7 kg)   Height: 5' 9 (1.753 m)     Physical Exam Physical Exam GENERAL: Appropriate to age, no acute distress. HEAD EYES EARS NOSE THROAT: Moist mucous membranes, atraumatic, normocephalic. CHEST: Clear to auscultation bilaterally, no wheezing, no crackles, no rales. CARDIAC: Regular rate and rhythm, normal S1, normal S2, no murmurs, no rubs, no gallops. ABDOMEN: Soft, nontender. NEUROLOGICAL: Motor and sensation grossly intact, alert and oriented times X 3. EXTREMITIES: Warm, well perfused, no edema.     CBC    Component Value Date/Time   WBC 16.0 (H) 05/25/2024 1529   RBC 5.18 05/25/2024 1529   HGB 15.8 05/25/2024 1529   HCT 49.7 05/25/2024 1529   PLT 379 05/25/2024 1529   MCV 95.9 05/25/2024 1529   MCH 30.5 05/25/2024 1529   MCHC 31.8 05/25/2024 1529   RDW 14.3 05/25/2024 1529   LYMPHSABS 1.8 05/25/2024 1529   MONOABS 1.1 (H) 05/25/2024 1529   EOSABS 0.1 05/25/2024 1529   BASOSABS 0.1 05/25/2024 1529     Chest imaging: I reviewed his CT chest done on 05/25/2024 showing large right-sided lung mass with mediastinal lymphadenopathy and an adrenal mass on the right.  Findings concerning for metastatic lung cancer.        Assessment & Plan:   Assessment and Plan Assessment & Plan Right lung mass, suspicious for malignancy and mediastinal adenopathy with possible adrenal mass Large right lung mass increased significantly, highly suspicious for lung cancer. Biopsy required for diagnosis due to contraindications for surgery. - Ordered biopsy via interventional radiology with local anesthesia. - Ordered PET scan to assess for metastasis. - Instructed to hold Plavix  for five  days before biopsy.  Based on imaging, lung lesion is highly concerning for malignancy. May potentially represent infection. We discussed diagnostic testing including CT-guided biopsy, bronchoscopy and surgery vs conservative management with further imaging. After addressing patient/family's questions, patient wishes to pursue diagnostic testing via IR CT guided biopsy. We discussed risks and benefits of procedure.    Chronic cough Chronic cough likely in the setting of COPD and ILD. Hydrogen peroxide nebulization not recommended. - Prescribed nebulized albuterol  and ipratropium (DuoNeb). - Prescribed cough suppressant with codeine  for nighttime. - Provided inhaler for symptom management.  Chronic respiratory failure with hypoxia Chronic respiratory failure with hypoxia requiring home oxygen  therapy. Supplemental oxygen  necessary due to low saturation levels. - Requested portable oxygen  machine for home use.  Chronic obstructive pulmonary disease (COPD) COPD with no current inhaler use. Alternative nebulized treatments recommended. - Prescribed nebulized albuterol  and ipratropium (DuoNeb). - Provided inhaler Breztri  for symptom management.  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 I asked them to stop using nebulized hydrogen peroxide.   Holding off on PFTs at this time.   CAD on plavix  Advised to stop plavix  for biopsy 5 days prior to scheduled date.           Zola Herter, MD [facility] Office: [phone]

## 2024-07-05 ENCOUNTER — Telehealth: Payer: Self-pay

## 2024-07-05 NOTE — Telephone Encounter (Signed)
 Copied from CRM #[id number]. Topic: Clinical - Order For Equipment >> Jul 05, 2024 11:19 AM Jeffrey Norton wrote: Reason for CRM: pt calling to check the status of his portable oxygen  equipment. Advised him the referral was placed and someone will reach out to him to give him the next step of the equipment

## 2024-07-07 NOTE — Telephone Encounter (Signed)
 Sent an urgent message to [employer] on this guy's O2 order.

## 2024-07-07 NOTE — Telephone Encounter (Signed)
 [facility] can we check on this?

## 2024-07-11 NOTE — Telephone Encounter (Signed)
 Tucker, Dolanda  Tucker, Dolanda; [place], [place]; [place], [place]ine; Jackson Macintosh; Sheree Glatter; 1 other This patient apparently has o2 with [employer] so we would not be able to provide poc he would have to go to them    I have sent the order to [facility].

## 2024-07-12 ENCOUNTER — Ambulatory Visit (HOSPITAL_COMMUNITY)
Admission: RE | Admit: 2024-07-12 | Discharge: 2024-07-12 | Disposition: A | Discharge status: Home / Self Care | Source: Ambulatory Visit

## 2024-07-12 DIAGNOSIS — R933 Abnormal findings on diagnostic imaging of other parts of digestive tract: Secondary | ICD-10-CM | POA: Insufficient documentation

## 2024-07-12 DIAGNOSIS — R053 Chronic cough: Secondary | ICD-10-CM | POA: Insufficient documentation

## 2024-07-12 DIAGNOSIS — N200 Calculus of kidney: Secondary | ICD-10-CM | POA: Diagnosis not present

## 2024-07-12 DIAGNOSIS — R0602 Shortness of breath: Secondary | ICD-10-CM | POA: Insufficient documentation

## 2024-07-12 DIAGNOSIS — C3411 Malignant neoplasm of upper lobe, right bronchus or lung: Secondary | ICD-10-CM | POA: Diagnosis not present

## 2024-07-12 DIAGNOSIS — R590 Localized enlarged lymph nodes: Secondary | ICD-10-CM | POA: Insufficient documentation

## 2024-07-12 DIAGNOSIS — I700 Atherosclerosis of aorta: Secondary | ICD-10-CM | POA: Diagnosis not present

## 2024-07-12 DIAGNOSIS — J439 Emphysema, unspecified: Secondary | ICD-10-CM | POA: Insufficient documentation

## 2024-07-12 DIAGNOSIS — C7971 Secondary malignant neoplasm of right adrenal gland: Secondary | ICD-10-CM | POA: Insufficient documentation

## 2024-07-12 DIAGNOSIS — R911 Solitary pulmonary nodule: Secondary | ICD-10-CM | POA: Diagnosis present

## 2024-07-12 DIAGNOSIS — I2510 Atherosclerotic heart disease of native coronary artery without angina pectoris: Secondary | ICD-10-CM | POA: Insufficient documentation

## 2024-07-12 LAB — GLUCOSE, CAPILLARY: Glucose-Capillary: 99 mg/dL (ref 70–99)

## 2024-07-12 MED ORDER — FLUDEOXYGLUCOSE F - 18 (FDG) INJECTION
8.5000 | Freq: Once | INTRAVENOUS | Status: AC | PRN
  Administered 2024-07-12: 15:00:00 8.5 via INTRAVENOUS

## 2024-07-18 ENCOUNTER — Telehealth: Payer: Self-pay

## 2024-07-18 NOTE — Telephone Encounter (Signed)
 Copied from CRM [id number]. Topic: Clinical - Lab/Test Results >> Jul 18, 2024 11:56 AM Corean SAUNDERS wrote: Reason for CRM: Patients wife is requesting a call back as they worried as to why his PET scan results are not yet available.

## 2024-07-19 ENCOUNTER — Other Ambulatory Visit: Payer: Self-pay

## 2024-07-19 DIAGNOSIS — R911 Solitary pulmonary nodule: Secondary | ICD-10-CM

## 2024-07-21 NOTE — Progress Notes (Unsigned)
 Hattar, Zola SAILOR, MD  Johann Sieving, MD; Baldwin Channing CROME Whatever is easier for you with good tissue yield. I thought big peripheral mass, might be able to spare him general anesthesia.  Thanks  LH       Previous Messages    ----- Message ----- From: Johann Sieving, MD Sent: 07/19/2024   3:19 PM EST To: Zola SAILOR Herter, MD; Channing CROME Baldwin Subject: FW: CT Biopsy                                  There is PET+ R adrenal met on recent scan. Would you rather have biopsy of that to establish staging?  Thanks Sieving Johann MD IR ----- Message ----- From: Baldwin Channing CROME Sent: 07/19/2024   8:33 AM EST To: Channing CROME Baldwin; Ir Procedure Requests Subject: CT Biopsy                                      Procedure :CT Biopsy  Reason : right lung mass Dx: Lung nodule [R91.1 (ICD-10-CM)]   History :NM PET Image Initial (PI) Skull Base To Thigh,CT Angio Chest PE W and/or Wo Contrast  Provider: Herter Zola SAILOR, MD  Provider contact ;  [phone]

## 2024-07-21 NOTE — Progress Notes (Unsigned)
 Johann Sieving, MD  Baldwin Channing CROME Cc: Zaida Zola SAILOR, MD PROCEDURE / BIOPSY REVIEW Date: 07/21/2024  Requested Biopsy site: R adrenal met Reason for request: r/o met Imaging review: Best seen on PET 07/12/24  Decision: Approved Imaging modality to perform: CT Schedule with: Moderate Sedation Schedule for: Any VIR  Additional comments: @VIR : prone  Please contact me with questions, concerns, or if issue pertaining to this request arise.  Dayne Sieving Johann, MD [facility]       Previous Messages    ----- Message ----- From: Zaida Zola SAILOR, MD Sent: 07/19/2024   4:04 PM EST To: Sieving Johann, MD; Channing CROME Baldwin Subject: RE: CT Biopsy                                  Whatever is easier for you with good tissue yield. I thought big peripheral mass, might be able to spare him general anesthesia.  Thanks  LH ----- Message ----- From: Johann Sieving, MD Sent: 07/19/2024   3:19 PM EST To: Zola SAILOR Zaida, MD; Channing CROME Baldwin Subject: FW: CT Biopsy                                  There is PET+ R adrenal met on recent scan. Would you rather have biopsy of that to establish staging?  Thanks Sieving Johann MD IR ----- Message ----- From: Baldwin Channing CROME Sent: 07/19/2024   8:33 AM EST To: Channing CROME Baldwin; Ir Procedure Requests Subject: CT Biopsy                                      Procedure :CT Biopsy  Reason : right lung mass Dx: Lung nodule [R91.1 (ICD-10-CM)]   History :NM PET Image Initial (PI) Skull Base To Thigh,CT Angio Chest PE W and/or Wo Contrast  Provider: Zaida Zola SAILOR, MD  Provider contact ;  [phone]

## 2024-07-24 ENCOUNTER — Other Ambulatory Visit: Payer: Self-pay | Admitting: Urology

## 2024-07-24 DIAGNOSIS — R972 Elevated prostate specific antigen [PSA]: Secondary | ICD-10-CM

## 2024-07-25 ENCOUNTER — Telehealth: Payer: Self-pay | Admitting: Cardiovascular Disease

## 2024-07-25 NOTE — Telephone Encounter (Signed)
 Left message on [facility] triage line regarding pt's stents and MRI compatibility. Call back number provided.

## 2024-07-25 NOTE — Telephone Encounter (Signed)
 Spoke with pt regarding information about pt's stents that his wife, Heron stopped by seeking. Explained to pt that when he filled out his DPR in January of 2025, he put that we couldn't give any of his medication information to anyone but him. Pt states he will correct this at his next office visit.   Pt is seeking information on stent in his heart and 2 stents in left iliac arteries. Pt's urologist (Dr. Sherwood Edison) is looking to do a prostate MRI and wants to make sure stents are compatible with machine.   Records located: STEMI 03/11/2002:    PV Intervention 08/15/2012:    Will forward this over to Dr. Court to advise.

## 2024-07-25 NOTE — Telephone Encounter (Signed)
 Spoke with pt's wife who walked in today requesting cards from previous stent placement.  Pt's wife states pt needs to have MRI and provider is asking for stent cards.  Pt apparently had Coronary stenting in 2003 and Iliac artery stenting in 2014 both completed by Dr Court. Pt's wife advised office does not keep copies of stent cards but will forward message to Dr Court and his RN for further assistance.  May communicate with pt through MyChart as pt's wife is not listed as DPR for this office.

## 2024-07-25 NOTE — Telephone Encounter (Signed)
 Pt spouse came in to retrieve a stent card for her husband. Please advise. Thank you.

## 2024-07-26 ENCOUNTER — Other Ambulatory Visit: Payer: Self-pay

## 2024-07-26 ENCOUNTER — Telehealth: Payer: Self-pay

## 2024-07-26 DIAGNOSIS — R0602 Shortness of breath: Secondary | ICD-10-CM

## 2024-07-26 DIAGNOSIS — R911 Solitary pulmonary nodule: Secondary | ICD-10-CM

## 2024-07-26 DIAGNOSIS — R053 Chronic cough: Secondary | ICD-10-CM

## 2024-07-26 MED ORDER — GUAIFENESIN-CODEINE 100-10 MG/5ML PO SOLN: 5.0000 mL | Freq: Every evening | ORAL | 1 refills | Status: DC | PRN

## 2024-07-26 NOTE — Telephone Encounter (Signed)
 Received form from [facility] stating pt has been using codeine  cough syrup 2 times daily and needs new script with those directions.  I tried calling the pt and was unable to reach him- so I left a vm to call back.  Dr. Zaida please advise if pt should be using PRN or 2 times daily & new rx needed.

## 2024-07-26 NOTE — Telephone Encounter (Signed)
 Called and spoke with the pt and he states he was taking it twice daily as directed by pharmacy. I advised pt cough syrup should be taken PRN. Pt verbalized understanding.   Pt also states he spoke with [employer] last week regarding POC and they are on back order and unsure when they will have more.   [facility] can we see if another company works with pts insurance for oxygen ? If so a new order will need to be placed and sent to a new DME company.

## 2024-07-31 NOTE — Telephone Encounter (Signed)
 Court Dorn PARAS, MD to Me (Selected Message)     07/26/24  7:45 AM OK for MRI with Nitinol stents Magnet size <3 Tesla.     Spoke with scheduling at [facility] regarding pt's heart and iliac stents. Dr. Carolee wanted to be sure that these are MRI compatible and per Dr. Court they are as long as magnetic field is no greater that 3 tesla. [facility] will move forward with getting pt's prostate MRI scheduled. No further questions at this time.

## 2024-08-01 ENCOUNTER — Telehealth: Payer: Self-pay

## 2024-08-01 NOTE — Telephone Encounter (Signed)
 Tried to reach out to patient - VM/ Lm to return call   (Looks like a scheduling issues possibly )       Copied from CRM [id number]. Topic: Clinical - Request for Lab/Test Order >> Jul 28, 2024  2:16 PM Devaughn RAMAN wrote: Reason for CRM: Pt's wife Heron called to advise they still have not gotten a call regarding scheduling a biopsy appt and the DME company has not called or dropped off the oxygen  for the pt, she is concerned and would like a callback regarding the biopsy.

## 2024-08-02 ENCOUNTER — Telehealth: Payer: Self-pay

## 2024-08-02 ENCOUNTER — Encounter: Payer: Self-pay | Admitting: Cardiovascular Disease

## 2024-08-02 NOTE — Telephone Encounter (Signed)
 Copied from CRM [id number]. Topic: Clinical - Request for Lab/Test Order >> Aug 01, 2024  4:43 PM Rilla B wrote: Reason for CRM: Patient's wife, Heron,  returning call to [employer]. Please call patient wife  @ [phone]    Spoke with patient tried to reach out to IR VM/ LM to please get in touch with patient.

## 2024-08-03 ENCOUNTER — Telehealth: Payer: Self-pay

## 2024-08-03 ENCOUNTER — Telehealth (HOSPITAL_BASED_OUTPATIENT_CLINIC_OR_DEPARTMENT_OTHER): Payer: Self-pay | Admitting: *Deleted

## 2024-08-03 NOTE — Telephone Encounter (Signed)
 Left message for the pt to call back and schedule an appt in office for preop clearance.

## 2024-08-03 NOTE — Telephone Encounter (Signed)
" ° °  Name: Jeffrey Norton  DOB: 07-25-1951  [name]  Primary Cardiologist: Dorn Lesches, MD  Chart reviewed as part of pre-operative protocol coverage. Because of Jeffrey Norton's past medical history and time since last visit, he will require a follow-up in-office visit in order to better assess preoperative cardiovascular risk.  Patient has not been seen in over a year.  Pre-op covering staff: - Please schedule appointment and call patient to inform them. If patient already had an upcoming appointment within acceptable timeframe, please add pre-op clearance to the appointment notes so provider is aware. - Please contact requesting surgeon's office via preferred method (i.e, phone, fax) to inform them of need for appointment prior to surgery.  This message will also be routed to  Dr. Lesches for input on holding Aspirin  and Plavix  as requested below so that this information is available to the clearing provider at time of patient's appointment.   Jeffrey JAYSON Braver, NP  08/03/2024, 11:10 AM   "

## 2024-08-03 NOTE — Telephone Encounter (Signed)
"  ° °  Pre-operative Risk Assessment    Patient Name: Jeffrey Norton  DOB: 07/30/1950 [name]   Date of last office visit: 08/03/23 DR. BERRY Date of next office visit: NONE   Request for Surgical Clearance    Procedure:  ADRENAL Bx  (suspected metastatic cancer)   Date of Surgery:  Clearance TBD STAT                               Surgeon: DR. ZOLA GASH Surgeon's Group or Practice Name:  [facility] Phone number:  [phone] Fax number:  [phone]   Type of Clearance Requested:   - Medical  - Pharmacy:  Hold Aspirin  and Clopidogrel  (Plavix )  x 5 DAYS PRIOR   Type of Anesthesia:  Not Indicated; MESSAGE SENT TO REQUESTING TO CONFIRM WHAT TYPE OF ANESTHESIA TO BE USED; IF ANY   Additional requests/questions:    Signed, Proctor Carriker   08/03/2024, 10:35 AM   "

## 2024-08-03 NOTE — Telephone Encounter (Signed)
 Been addressed  -NFN

## 2024-08-03 NOTE — Telephone Encounter (Signed)
 ----- Message from [facility] Niels F sent at 08/03/2024  8:03 AM EST ----- Regarding: RE: SCHEDULING UPDATE REQUESTED Good morning Burnard,   I was sent a staff message about a procedure for the pt. Can you please help or direct me to the person who can, in regard to our cardiologist will need a preop clearance request sent to our office STAT today fax# [phone]; attn: preop team. Here is the staff message that came to me this morning. Burnard, I also just sent you a secure chat as well. I will keep an eye for the request to come to us  today STAT please   Thank you  Niels Hila, Barnie, NP  P Cv Div Preop Callback Pre-op team,  Will you please reach out to this physician to get an official pre-op request going for holding Plavix ?  Thank you!  DW     Previous Messages    ----- Message ----- From: Zaida Zola SAILOR, MD Sent: 08/02/2024   3:49 PM EST To: Toribio Faes, MD; Dorn JINNY Lesches, MD; Ch# Subject: RE: AXEL UPDATE REQUESTED                Hi Dr Lesches and Ms Hila  Mr Breeden needs an adrenal biopsy for suspected metastatic cancer and I think you might be following his for his PAD and CAD. Do you think its ok to hold his plavix  for 5 days for the procedure ? We appreciate your help. We are reaching out from pulmonary and radiology.  Thanks  LH ----- Message ----- From: Daralene Ferol FALCON, RT Sent: 08/02/2024   3:23 PM EST To: Toribio Faes, MD; Zola SAILOR Zaida, MD; Cher# Subject: RE: AXEL UPDATE REQUESTED                Channing is out until 01/19 - I am covering for her in that time. It appears that she sent a blood thinner hold to the ordering provider of his Plavix  and the office has not yet sent it back approving the hold for 5 days. I will follow up with the office - Thanks Taryn ----- Message ----- From: Faes Toribio, MD Sent: 08/02/2024   3:04 PM EST To: Zola SAILOR Zaida, MD; Channing LITTIE Eagles Subject: SCHEDULING UPDATE REQUESTED                     Could you please give Dr VEAR a scheduling update?  Thx DDH ----- Message ----- From: Zaida Zola SAILOR, MD Sent: 08/02/2024   1:26 PM EST To: Toribio Faes, MD; Burnard SAUNDERS [place]#  Hi Dr Faes  I was hoping to see if there was a timing for this patient's biopsy. Please let me know if there is anything I can do to help  Best,  LH ----- Message ----- From: Hila Barnie, NP Sent: 08/03/2024   7:43 AM EST To: Cv Div Preop Callback Subject: FW: SCHEDULING UPDATE REQUESTED                Pre-op team,   Will you please reach out to this physician to get an official pre-op request going for holding Plavix ?   Thank you!  DW ----- Message ----- From: Zaida Zola SAILOR, MD Sent: 08/02/2024   3:49 PM EST To: Toribio Faes, MD; Dorn JINNY Lesches, MD; Ch# Subject: RE: AXEL UPDATE REQUESTED                Hi Dr Lesches and Ms Hila   Mr  Roylance needs an adrenal biopsy for suspected metastatic cancer and I think you might be following his for his PAD and CAD. Do you think its ok to hold his plavix  for 5 days for the procedure ? We appreciate your help. We are reaching out from pulmonary and radiology.   Thanks   LH ----- Message ----- From: Daralene Ferol FALCON, RT Sent: 08/02/2024   3:23 PM EST To: Toribio Faes, MD; Zola LOISE Herter, MD; Cher# Subject: RE: AXEL UPDATE REQUESTED                Channing is out until 01/19 - I am covering for her in that time. It appears that she sent a blood thinner hold to the ordering provider of his Plavix  and the office has not yet sent it back approving the hold for 5 days. I will follow up with the office - Thanks Taryn ----- Message ----- From: Faes Toribio, MD Sent: 08/02/2024   3:04 PM EST To: Zola LOISE Herter, MD; Channing LITTIE Eagles Subject: SCHEDULING UPDATE REQUESTED                    Could you please give Dr VEAR a scheduling update?  Thx DDH ----- Message ----- From: Herter Zola LOISE, MD Sent: 08/02/2024   1:26 PM  EST To: Toribio Faes, MD; Burnard SAUNDERS [place]#  Hi Dr Faes  I was hoping to see if there was a timing for this patient's biopsy. Please let me know if there is anything I can do to help   Best,   [facility]

## 2024-08-03 NOTE — Telephone Encounter (Signed)
-----   Message from Zola Herter, MD sent at 08/02/2024  1:24 PM EST ----- Hi Dr Johann  I was hoping to see if there was a timing for this patient's biopsy. Please let me know if there is anything I can do to help   Best,   [facility]

## 2024-08-03 NOTE — Telephone Encounter (Signed)
 PCC's, please advise on update.   Can we send to another DME and request oxygen  to be delivered today.

## 2024-08-03 NOTE — Telephone Encounter (Signed)
 Tucker, Dolanda  Tucker, Dolanda; [place], [place]; [place], Adine; Jackson Macintosh; Sheree Glatter; 1 other This patient apparently has o2 with [facility] so we would not be able to provide poc he would have to go to them    This was a message from [employer]. We have sent the order to [facility] on 07/07/24. I have attempted to contact [facility] in the morning but unfortunately their line is down. I have called at 12:00pm but have been hold for 28 mins. I have left my number for them to callback in regards to this order.

## 2024-08-03 NOTE — Telephone Encounter (Signed)
 Pre op risk assessment form has been faxed to cardiology at [phone]. This clearance is needed prior to schedule bx.

## 2024-08-04 ENCOUNTER — Ambulatory Visit: Attending: Cardiology | Admitting: Cardiology

## 2024-08-04 ENCOUNTER — Encounter: Payer: Self-pay | Admitting: *Deleted

## 2024-08-04 ENCOUNTER — Encounter: Payer: Self-pay | Admitting: Cardiology

## 2024-08-04 ENCOUNTER — Telehealth: Payer: Self-pay

## 2024-08-04 VITALS — BP 116/64 | HR 103 | Ht 69.0 in | Wt 143.4 lb

## 2024-08-04 DIAGNOSIS — I5020 Unspecified systolic (congestive) heart failure: Secondary | ICD-10-CM

## 2024-08-04 DIAGNOSIS — I2510 Atherosclerotic heart disease of native coronary artery without angina pectoris: Secondary | ICD-10-CM | POA: Diagnosis not present

## 2024-08-04 DIAGNOSIS — I480 Paroxysmal atrial fibrillation: Secondary | ICD-10-CM | POA: Diagnosis not present

## 2024-08-04 DIAGNOSIS — Z01810 Encounter for preprocedural cardiovascular examination: Secondary | ICD-10-CM | POA: Diagnosis not present

## 2024-08-04 DIAGNOSIS — I10 Essential (primary) hypertension: Secondary | ICD-10-CM | POA: Diagnosis not present

## 2024-08-04 NOTE — Telephone Encounter (Signed)
 Copied from CRM [id number]. Topic: Clinical - Request for Lab/Test Order >> Aug 01, 2024  4:43 PM Rilla B wrote: Reason for CRM: Patient's wife, Heron,  returning call to [place]. Please call patient wife  @ [phone] >> Aug 02, 2024 12:22 PM Rozanna MATSU wrote: Vickie with [facility] stated [facility] left a message on their voicemail about scheduling this patient, but she stated the notes in the pts chart shows this is being schedule with the hospital. Thanks      Already been addressed   - NFN

## 2024-08-04 NOTE — Telephone Encounter (Signed)
 I s/w the pt and he has been scheduled to see Katlyn West, NP today 3:35. Pt aware of [address] location.   Requesting office aware pt has appt today.    I was sent a secure chat by Warren Sora, RN: Jeffrey Norton, I'm speaking to this patient's wife. Please call her to schedule clearance appointment.   In my review of DPR I found : For all [facility], Designated Party form signed (date) __1/7/25__ . Can leave detailed message on _home phone @ [phone]__. Authorized to discuss medical info with __PT only__.   Per RN: She is the emergency contact. can you call her and ask her to have the husband call you back. She states her husband is not using his phone. they need a biopsy ASAP and they will not schedule until you guys clear   Me: she may be emergency contact but the DPR is what needs to be follow as to who we s/w; if he wants her on DPR he has to put her on there. BUt I will take call the pt and explain this.   RN: OK. Let me know if you don't speak to him or are able to schedule an appointment   Me: I will call and explain this to her and if is right there I can get a verbal from pt and then he can change his DPR

## 2024-08-04 NOTE — Patient Instructions (Signed)
 Medication Instructions:  Your physician recommends that you continue on your current medications as directed. Please refer to the Current Medication list given to you today.  *If you need a refill on your cardiac medications before your next appointment, please call your pharmacy*  Lab Work: NONE If you have labs (blood work) drawn today and your tests are completely normal, you will receive your results only by: MyChart Message (if you have MyChart) OR A paper copy in the mail If you have any lab test that is abnormal or we need to change your treatment, we will call you to review the results.  Testing/Procedures: NONE  Follow-Up: At [facility], you and your health needs are our priority.  As part of our continuing mission to provide you with exceptional heart care, our providers are all part of one team.  This team includes your primary Cardiologist (physician) and Advanced Practice Providers or APPs (Physician Assistants and Nurse Practitioners) who all work together to provide you with the care you need, when you need it.  Your next appointment:   Please reschedule all appointment on 08/08/24 for 3 months out. Then a follow up with Dr. Court after the rescheduled appointments from 08/08/24  are complete.

## 2024-08-04 NOTE — Progress Notes (Unsigned)
 "  Cardiology Office Note    Date:  08/04/2024  ID:  TORY SEPTER, DOB 09-Dec-1950, MRN [id number] PCP:  Dayna Motto, DO  Cardiologist:  Dorn Lesches, MD  Electrophysiologist:  None   Chief Complaint: Preoperative cardiac evaluation  History of Present Illness: .   Jeffrey Norton is a 74 y.o. male with visit-pertinent history of CAD, PAF/A-flutter, chronic systolic heart failure, pericarditis, PAD, hypertension, hyperlipidemia and CKD stage III.  Patient with history of PCI with DES to RCA in 2003, PCI with DES for in-stent restenosis to RCA in 2005.  In 2014 underwent abdominal aorta gram/lower extremity with PTA and stenting of left external iliac and common femoral arteries.  Patient presented as NSTEMI on 07/07/2022 LHC indicated LAD 45%, ostial proximal CX 40%, OM1 40%, proximal to mid RCA 50%, no culprit lesion to explain severe chest pain, diffuse in-stent disease of the RCA at 50%. It was felt that ST elevation was due to LVH, demand ischemia was suspected in setting of rapid A-fib/flutter and severely elevated BP, medical management was recommended.    Patient again presented with NSTEMI on 02/22/2023, patient noted chest pressure and shortness of breath, felt he was in A-fib he reported substernal chest pain for a few days prior not associated with exertion, EKG with subtle ST changes.  Chest x-ray showed possible pulmonary edema, patient reported not taking Eliquis .  Proximal-mid LAD 45%, ostial-proximal LCx 40%, OM1 40%, proximal-mid RCA 100% in-stent restenosis, PCI with balloon angioplasty, postintervention 40% stenosis.  Hospital course was complicated by 1 episode of hemoptysis and 3 episodes of black stool, heparin  was held and GI consulted.  Patient declined EGD and left AMA on aspirin  325 mg once daily and no P2 Y12 inhibition, was not discharged on anticoagulation.  On 03/04/2023 patient presented to the ED for gradual onset central nonradiating chest pain/pressure that began  5 hours prior to arrival.  Patient had stopped aspirin  due to concern for GI bleeding.  EKG showed minimal J-point elevation inferior leads improved when compared to EKG from the prior month.  Echo showed moderate pericardial effusion with no tamponade, EGD showed gastritis with hemorrhage that was biopsied, duodenal erosions without bleeding.  GI approved restart of anticoagulation, patient declined restarting Brilinta  or Eliquis , was restarted on Plavix .  Patient was started on colchicine , repeat limited echo showed decreased pericardial effusion, was small with no evidence of tamponade.  Today patient presents for preoperative cardiac evaluation.  He is currently pending adrenal biopsy with Dr. Rochele.  Patient denies any chest pain, reports that his breathing has been stable, he denies any increased lower extremity edema, orthopnea or PND.  He denies any presyncope, syncope or palpitations.  His wife notes that she is unsure if he has been taking his metoprolol , will confirm when they return home.  Patient notes that he is not as active as he previously was however he is able to achieve greater than 4 METS of activity with ADLs, walking, climbing stairs at the condominium that they live in and with household chores.  They deny any cardiac concerns or complaints at this time. ROS: .   Today he denies chest pain, lower extremity edema, fatigue, palpitations, melena, hematuria, hemoptysis, diaphoresis, weakness, presyncope, syncope, orthopnea, and PND.  All other systems are reviewed and otherwise negative. Studies Reviewed: SABRA    EKG:  EKG is ordered today, personally reviewed, demonstrating  EKG Interpretation Date/Time:  Friday August 04 2024 15:53:27 EST Ventricular Rate:  103 PR Interval:  140 QRS Duration:  102 QT Interval:  328 QTC Calculation: 429 R Axis:   -38  Text Interpretation: Sinus tachycardia Possible Left atrial enlargement Left axis deviation Left ventricular hypertrophy with  repolarization abnormality ( R in aVL , Cornell product ) Old inferior infarct Confirmed by Kendall Arnell [phone]) on 08/04/2024 4:37:19 PM   CV Studies: Cardiac studies reviewed are outlined and summarized above. Otherwise please see EMR for full report. Cardiac Studies & Procedures   ______________________________________________________________________________________________ CARDIAC CATHETERIZATION  CARDIAC CATHETERIZATION 02/22/2023  Conclusion   Prox LAD to Mid LAD lesion is 45% stenosed.   Ost Cx to Prox Cx lesion is 40% stenosed.   1st Mrg lesion is 40% stenosed.   Prox RCA to Mid RCA lesion is 100% stenosed within the stent.   Balloon angioplasty was performed using a BALL SAPPHIRE NC24 3.0X15.   Post intervention, there is a 40% residual stenosis.   LV end diastolic pressure is normal.   There is no aortic valve stenosis.   Recommend uninterrupted dual antiplatelet therapy with Aspirin  81mg  daily and Ticagrelor  90mg  twice daily for a minimum of 12 months (ACS-Class I recommendation).  Late presenting inferior MI.  Stent occlusion likely happened about 48 hours ago.  Due to patient's residual discomfort, we opened the stented segment with angioplasty and thrombectomy improving flow.  The patient was pain-free.  He did have some hemodynamic instability during the procedure.  He has responded well to low-dose dopamine .  Hopefully, this can be weaned off quickly.  Will continue anticoagulation to help with residual thrombus.  I suspect that with the extensive thrombus, the patient will not benefit from further stenting of the mid RCA.  Further imaging based on his symptoms.  He will be moved to the ICU.  Findings Coronary Findings Diagnostic  Dominance: Right  Left Main Vessel is normal in caliber. There is mild diffuse disease throughout the vessel.  Left Anterior Descending Prox LAD to Mid LAD lesion is 45% stenosed. The lesion is segmental.  Left Circumflex Ost Cx to Prox Cx  lesion is 40% stenosed.  First Obtuse Marginal Branch 1st Mrg lesion is 40% stenosed.  Right Coronary Artery Prox RCA to Mid RCA lesion is 100% stenosed. The lesion is thrombotic. The lesion was previously treated .  Right Posterior Descending Artery Collaterals RPDA filled by collaterals from 2nd Sept.  Intervention  Prox RCA to Mid RCA lesion Thrombectomy CATHETER LAUNCHER 6FR AL1 guide catheter was inserted. WIRE RUNTHROUGH .[id number] guidewire was used to cross lesion. Aspiration thrombectomy performed using a CATH EXTRAC PRONTO 5.61F 138CM. 2 passes taken. Removal of visible thrombus on both passes. Angioplasty CATHETER LAUNCHER 6FR AL1 guide catheter was inserted. WIRE RUNTHROUGH .[id number] guidewire used to cross lesion. Balloon angioplasty was performed using a BALL SAPPHIRE NC24 3.0X15. Initially did angioplasty with a 2.5 balloon.  This was followed by aspiration thrombectomy.  This was then followed by the [place] balloon. Post-Intervention Lesion Assessment The intervention was successful. Pre-interventional TIMI flow is 0. Post-intervention TIMI flow is 2. Slow flow to the posterolateral artery.  Branches appear patent.  There is some likely underfilling, spasm and thrombus in the PDA. There is a 40% residual stenosis post intervention.   CARDIAC CATHETERIZATION  CARDIAC CATHETERIZATION 07/07/2022  Conclusion   Prox LAD to Mid LAD lesion is 45% stenosed.   Ost Cx to Prox Cx lesion is 40% stenosed.   1st Mrg lesion is 40% stenosed.   Prox RCA to Mid RCA lesion is 50% stenosed.  The left ventricular systolic function is normal.   LV end diastolic pressure is normal.   The left ventricular ejection fraction is 55-65% by visual estimate.   There is trivial (1+) aortic regurgitation.  Nonobstructive CAD. No culprit lesion seen to explain his severe chest pain. There is diffuse in stent disease in the RCA but this appears to be 50%. Normal LV function Normal LVEDP Mildly  dilated aorta without dissection. Mild AI  Plan: I suspect this represents more demand ischemia in setting of rapid Afib/flutter and severely elevated BP. Recommend medical management with optimal BP control. Will resume IV heparin  for now post cath. Anticipate transition to oral DOAC.  Findings Coronary Findings Diagnostic  Dominance: Right  Left Main Vessel was injected. Vessel is normal in caliber. There is mild diffuse disease throughout the vessel.  Left Anterior Descending Prox LAD to Mid LAD lesion is 45% stenosed. The lesion is segmental.  Left Circumflex Ost Cx to Prox Cx lesion is 40% stenosed.  First Obtuse Marginal Branch 1st Mrg lesion is 40% stenosed.  Right Coronary Artery Prox RCA to Mid RCA lesion is 50% stenosed. The lesion was previously treated .  Intervention  No interventions have been documented.   STRESS TESTS  NM MYOCAR MULTI W/SPECT W 07/19/2012   ECHOCARDIOGRAM  ECHOCARDIOGRAM LIMITED 03/10/2023  Narrative ECHOCARDIOGRAM LIMITED REPORT    Patient Name:   Jeffrey Norton Date of Exam: 03/10/2023 Medical Rec #:  [id number]        Height:       69.0 in Accession #:    [id number]       Weight:       166.0 lb Date of Birth:  08/04/1950        BSA:          1.909 m Patient Age:    71 years         BP:           128/75 mmHg Patient Gender: M                HR:           79 bpm. Exam Location:  [address]  Procedure: Limited Echo, 2D Echo, Cardiac Doppler and Limited Color Doppler  Indications:    I31.3 Pericardial Effusion  History:        Patient has prior history of Echocardiogram examinations, most recent 03/05/2023. CHF, CAD and Previous Myocardial Infarction, PAD, Arrythmias:Atrial Fibrillation and Atrial Flutter; Risk Factors:Family History of Coronary Artery Disease, Hypertension, Dyslipidemia and Former Smoker. Pericardial Effusion following MI (prior EF 40-45% with Moderate Effusion).  Sonographer:    Heather Hawks  RDCS Referring Phys: LAYMON CHRISTELLA QUA  IMPRESSIONS   1. Left ventricular ejection fraction, by estimation, is 40 to 45%. The left ventricle has mildly decreased function. Left ventricular diastolic parameters are consistent with Grade I diastolic dysfunction (impaired relaxation). 2. Right ventricular systolic function is moderately reduced. 3. Posterior pericardial effusion is smaller since previous. a small pericardial effusion is present. The pericardial effusion is posterior to the left ventricle. There is no evidence of cardiac tamponade. 4. The mitral valve is normal in structure. Mild mitral valve regurgitation. 5. The aortic valve is grossly normal. There is mild calcification of the aortic valve. Aortic valve regurgitation is mild. Aortic valve sclerosis/calcification is present, without any evidence of aortic stenosis. 6. Aortic dilatation noted. There is mild dilatation of the ascending aorta, measuring 40 mm. There is borderline dilatation of the aortic  root, measuring 39 mm.  FINDINGS Left Ventricle: Left ventricular ejection fraction, by estimation, is 40 to 45%. The left ventricle has mildly decreased function. Left ventricular diastolic parameters are consistent with Grade I diastolic dysfunction (impaired relaxation).   LV Wall Scoring: The inferior septum and entire inferior wall are hypokinetic.  Right Ventricle: Right ventricular systolic function is moderately reduced.  Pericardium: Posterior pericardial effusion is smaller since previous. A small pericardial effusion is present. The pericardial effusion is posterior to the left ventricle. There is no evidence of cardiac tamponade.  Mitral Valve: The mitral valve is normal in structure. Mild mitral valve regurgitation.  Tricuspid Valve: The tricuspid valve is normal in structure. Tricuspid valve regurgitation is mild.  Aortic Valve: The aortic valve is grossly normal. There is mild calcification of the aortic valve.  Aortic valve regurgitation is mild. Aortic regurgitation PHT measures 371 msec. Aortic valve sclerosis/calcification is present, without any evidence of aortic stenosis.  Pulmonic Valve: Pulmonic valve regurgitation is trivial.  Aorta: Aortic dilatation noted. There is mild dilatation of the ascending aorta, measuring 40 mm. There is borderline dilatation of the aortic root, measuring 39 mm.  LEFT VENTRICLE PLAX 2D LVIDd:         5.65 cm   Diastology LVIDs:         4.50 cm   LV e' medial:    5.87 cm/s LV PW:         1.00 cm   LV E/e' medial:  13.2 LV IVS:        1.05 cm   LV e' lateral:   8.38 cm/s LVOT diam:     2.85 cm   LV E/e' lateral: 9.2 LV SV:         101 LV SV Index:   53 LVOT Area:     6.38 cm   RIGHT VENTRICLE RV S prime:     6.00 cm/s RVSP:           26.8 mmHg  LEFT ATRIUM         Index       RIGHT ATRIUM LA diam:    4.70 cm 2.46 cm/m  RA Pressure: 3.00 mmHg AORTIC VALVE LVOT Vmax:   105.82 cm/s LVOT Vmean:  60.025 cm/s LVOT VTI:    0.159 m AI PHT:      371 msec  AORTA Ao Root diam: 3.90 cm Ao Asc diam:  3.95 cm  MITRAL VALVE                TRICUSPID VALVE MV Area (PHT): cm          TR Peak grad:   23.8 mmHg MV Decel Time: 192 msec     TR Vmax:        244.00 cm/s MR Peak grad: 81.0 mmHg     Estimated RAP:  3.00 mmHg MR Mean grad: 55.0 mmHg     RVSP:           26.8 mmHg MR Vmax:      450.00 cm/s MR Vmean:     349.0 cm/s    SHUNTS MV E velocity: 77.30 cm/s   Systemic VTI:  0.16 m MV A velocity: 109.00 cm/s  Systemic Diam: 2.85 cm MV E/A ratio:  0.71  Toribio Fuel MD Electronically signed by Toribio Fuel MD Signature Date/Time: 03/10/2023/2:55:48 PM    Final          ______________________________________________________________________________________________       Current Reported Medications:.  Active Medications[1]  Physical Exam:    VS:  BP 116/64   Pulse (!) 103   Ht 5' 9 (1.753 m)   Wt 143 lb 6.4 oz (65 kg)   SpO2 91%    BMI 21.18 kg/m    Wt Readings from Last 3 Encounters:  08/04/24 143 lb 6.4 oz (65 kg)  07/03/24 158 lb (71.7 kg)  05/25/24 170 lb (77.1 kg)    GEN: Well nourished, well developed in no acute distress NECK: No JVD; No carotid bruits CARDIAC: RRR, no murmurs, rubs, gallops RESPIRATORY:  Clear to auscultation without rales, wheezing or rhonchi  ABDOMEN: Soft, non-tender, non-distended EXTREMITIES:  No edema; No acute deformity     Asessement and Plan:.    Preoperative cardiac evaluation: Per request patient is currently pending adrenal biopsy. Mr. Mccarley perioperative risk of a major cardiac event is 6.6% according to the Revised Cardiac Risk Index (RCRI).  Therefore, he is at high risk for perioperative complications.   His functional capacity is fair at 5.07 METs according to the Duke Activity Status Index (DASI). Recommendations:According to ACC/AHA guidelines, no further cardiovascular testing needed.  The patient may proceed to surgery at acceptable risk.  Antiplatelet and/or Anticoagulation Recommendations: Will review with Dr. Wadie regarding request to hold aspirin  and Plavix .  CAD:  S/p PCI with DES to RCA 2003, DES for in-stent restenosis to RCA 2005.  Diffuse in-stent disease RCA approximately 50% seen on angiography December 2023.  NSTEMI July 2024.  Proximal to mid RCA 100% in-stent restenosis s/p PCI with balloon angioplasty. Stable with no anginal symptoms. No indication for ischemic evaluation.  Heart healthy diet and regular cardiovascular exercise encouraged.  Reviewed ED precautions. Continue aspirin  81 mg daily and Plavix  75 mg daily.   Pericarditis/pericardial effusion: Echo 03/05/2023 showed moderate pericardial effusion posterior to LV without cardiac tamponade. Patient started on colchicine . Repeat echo 03/10/2023 showed effusion decreased in size now described as small pericardial effusion without cardiac tamponade.  Patient denies any chest pain, reports that his  breathing has been stable.   Chronic systolic HF: Echo in August 2024 showed EF 40 to 45%, moderately reduced RV function, mild RVH. Today he reports that his breathing has been stable, he denies any increased lower extremity edema, orthopnea or PND. Patient appears euvolemic and well compensated on exam.  PAF: Patient previously diagnosed with PAF when presented with rapid A-fib/flutter in setting of NSTEMI in December 2023.  Patient was started on Eliquis  and metoprolol , self discontinued Eliquis  after hospital discharge.  Patient has declined anticoagulation.  Hypertension: Blood pressure today 116/64.  Continue current antihypertensive regimen.   Disposition: F/u with Dr. Court in three months   Signed, Kaydence Baba D Lissett Favorite, NP       [1]  Current Meds  Medication Sig   amoxicillin -clavulanate (AUGMENTIN ) 875-125 MG tablet Take 1 tablet by mouth every 12 (twelve) hours.   APPLE CIDER VINEGAR PO Take 1 Dose by mouth See admin instructions. 2 teaspoons honey + apple cider vinegar to thin, every 6 hours as needed for cough.   Ascorbic Acid (VITAMIN C PO) Take 1 tablet by mouth daily.   aspirin  EC 81 MG tablet Take 81 mg by mouth every other day.   b complex vitamins capsule Take 1 capsule by mouth daily.   budesonide-glycopyrrolate-formoterol (BREZTRI  AEROSPHERE) 160-9-4.8 MCG/ACT AERO inhaler Inhale 2 puffs into the lungs in the morning and at bedtime.   clopidogrel  (PLAVIX ) 75 MG tablet Take 1 tablet (75 mg total) by mouth  daily.   Coenzyme Q10 (COQ10 PO) Take 1 capsule by mouth daily.   colchicine  0.6 MG tablet Take 1 tablet (0.6 mg total) by mouth daily.   ergocalciferol (VITAMIN D2) 1.25 MG (50000 UT) capsule Take 50,000 Units by mouth daily.   furosemide  (LASIX ) 40 MG tablet Take 1 tablet (40 mg total) by mouth daily. As needed for fluid in legs, weight gain   guaiFENesin -codeine  100-10 MG/5ML syrup Take 5 mLs by mouth at bedtime as needed for cough.   ipratropium-albuterol  (DUONEB)  0.5-2.5 (3) MG/3ML SOLN Take 3 mLs by nebulization every 4 (four) hours as needed.   MAGNESIUM PO Take 1 tablet by mouth daily.   metoprolol  succinate (TOPROL -XL) 25 MG 24 hr tablet Take 1 tablet (25 mg total) by mouth daily.   Multiple Vitamins-Minerals (ZINC PO) Take 1 tablet by mouth daily.   nitroGLYCERIN  (NITROSTAT ) 0.4 MG SL tablet Place 1 tablet (0.4 mg total) under the tongue every 5 (five) minutes as needed for chest pain.   olmesartan (BENICAR) 40 MG tablet Take 40 mg by mouth daily.   pantoprazole  (PROTONIX ) 40 MG tablet Take 1 tablet (40 mg total) by mouth daily.   phytonadione (VITAMIN K) 5 MG tablet Take 1 tablet by mouth daily.   QUERCETIN PO Take 1 tablet by mouth daily.   vitamin E 180 MG (400 UNITS) capsule Take 400 Units by mouth daily.   "

## 2024-08-04 NOTE — Progress Notes (Signed)
 Received a call from the patient's wife. She would like patient to be scheduled with Dr Timmy for his new likely lung cancer diagnosis. Explained to the patient that until a tissue diagnosis is made, that there is not a role for medical oncology.   Upon review of the chart however, I did see where the patient's biopsy had been ordered on 07/19/24 but was not yet scheduled. The patient needs cardiac clearance and okay to hold plavix , but has not yet been scheduled with cardiology.   I reached out to his cardiologists office. They were able to call patient and schedule an appointment this afternoon.   Informed his wife, that once biopsy was scheduled, I could schedule a new patient appointment in this office for about 7-10 days later to discuss biopsy and treatment options. She was very appreciative of the instruction and help.   Oncology Nurse Navigator Documentation     08/04/2024   10:30 AM  Oncology Nurse Navigator Flowsheets  Navigator Follow Up Date: 08/08/2024  Navigator Follow Up Reason: Appointment Review  Navigator Location [facility]  Navigator Encounter Type Telephone  Telephone Incoming Call  Patient Visit Type MedOnc  Treatment Phase Abnormal Scans  Barriers/Navigation Needs Coordination of Care;Education  Education Other  Interventions Coordination of Care  Acuity Level 2-Minimal Needs (1-2 Barriers Identified)  Coordination of Care Other  Support Groups/Services Friends and Family  Time Spent with Patient 30

## 2024-08-06 ENCOUNTER — Encounter: Payer: Self-pay | Admitting: Cardiology

## 2024-08-08 ENCOUNTER — Ambulatory Visit (HOSPITAL_COMMUNITY)

## 2024-08-08 ENCOUNTER — Telehealth: Payer: Self-pay

## 2024-08-08 NOTE — Telephone Encounter (Signed)
 Preoperative cardiac evaluation: Per request patient is currently pending adrenal biopsy. Jeffrey Norton perioperative risk of a major cardiac event is 6.6% according to the Revised Cardiac Risk Index (RCRI).  Therefore, he is at high risk for perioperative complications.   His functional capacity is fair at 5.07 METs according to the [employer] Activity Status Index (DASI). Recommendations:According to ACC/AHA guidelines, no further cardiovascular testing needed.  The patient may proceed to surgery at acceptable risk.  Antiplatelet and/or Anticoagulation Recommendations: Will need to review with Dr. Court regarding request to hold aspirin  and Plavix , note will be addended once recommendation received. ADDENDUM 08/07/24: Per Dr. Court, patient can hold aspirin  and Plavix  for five days prior to biopsy, please resume as soon as safe to do so from a bleeding standpoint.    Note from cardiology from office visit on 1/9,patient is cleared to hold medication for IR biopsy

## 2024-08-09 ENCOUNTER — Encounter: Payer: Self-pay | Admitting: *Deleted

## 2024-08-09 ENCOUNTER — Encounter (HOSPITAL_COMMUNITY): Payer: Self-pay | Admitting: Radiology

## 2024-08-09 NOTE — Progress Notes (Signed)
 After working with cardiology office and scheduling department, patient has been scheduled for biopsy on Monday, 08/14/2024. Will schedule with Dr Timmy at the end of the week with hopes that pathology will be resulted by then.   Reached out to Jerel JONETTA Feathers to introduce myself as the office RN Navigator and explain our new patient process. Reviewed the reason for their referral and scheduled their new patient appointment along with labs. Provided address and directions to the office including call back phone number. Reviewed with patient any concerns they may have or any possible barriers to attending their appointment.   Informed patient about my role as a navigator and that I will meet with them prior to their New Patient appointment and more fully discuss what services I can provide. At this time patient has no further questions or needs.    Oncology Nurse Navigator Documentation     08/09/2024    9:30 AM  Oncology Nurse Navigator Flowsheets  Abnormal Finding Date 05/25/2024  Diagnosis Status Additional Work Up  Navigator Follow Up Date: 08/18/2024  Navigator Follow Up Reason: New Patient Appointment  Navigator Location [facility]  Navigator Encounter Type Introductory Phone Call  Patient Visit Type MedOnc  Treatment Phase Abnormal Scans  Barriers/Navigation Needs Coordination of Care;Education  Education Other  Interventions Coordination of Care;Education  Acuity Level 2-Minimal Needs (1-2 Barriers Identified)  Coordination of Care Appts  Education Method Verbal  Support Groups/Services Friends and Family  Time Spent with Patient 45

## 2024-08-10 ENCOUNTER — Encounter: Payer: Self-pay | Admitting: *Deleted

## 2024-08-10 NOTE — Progress Notes (Signed)
 Patient wife called wanting to talk about her concerns with her husband, yesterday afternoon. She is concerned that he seems very depressed. He doesn't want to eat or drink. She feels like he may be giving up. She frequently tries to encourage intake but is often met with resistance. She also states that she has tried to encourage him to go to the ED for fluids because she's afraid he's getting dehydrated, but the patient refuses.   We reviewed different strategies and ideas on how to encourage intake in the patient. We also discussed that patient needs to be in decent shape to tolerate any treatment that might be planned in the near future. Gave her some talking points to share with her husband.   Called this afternoon to follow up on her concerns. She said she did have a discussion with her husband yesterday and that today has been better. He's eating some food and drinking more fluids He even has left the house twice to meet with friends and run an errand. Heron is more encouraged today. She knows our number to call if she has further concerns.  Oncology Nurse Navigator Documentation     08/10/2024    1:15 PM  Oncology Nurse Navigator Flowsheets  Navigator Follow Up Date: 08/18/2024  Navigator Follow Up Reason: New Patient Appointment  Navigator Location [facility]  Navigator Encounter Type Telephone  Telephone Incoming Call  Patient Visit Type MedOnc  Treatment Phase Abnormal Scans  Barriers/Navigation Needs Coordination of Care;Education  Interventions Psycho-Social Support  Acuity Level 2-Minimal Needs (1-2 Barriers Identified)  Support Groups/Services Friends and Family  Time Spent with Patient 30

## 2024-08-12 ENCOUNTER — Emergency Department (HOSPITAL_COMMUNITY)

## 2024-08-12 ENCOUNTER — Other Ambulatory Visit: Payer: Self-pay

## 2024-08-12 ENCOUNTER — Inpatient Hospital Stay (HOSPITAL_COMMUNITY)
Admission: EM | Admit: 2024-08-12 | Discharge: 2024-08-27 | DRG: 054 | Disposition: E | Attending: Family Medicine | Admitting: Family Medicine

## 2024-08-12 DIAGNOSIS — R54 Age-related physical debility: Secondary | ICD-10-CM | POA: Diagnosis present

## 2024-08-12 DIAGNOSIS — C771 Secondary and unspecified malignant neoplasm of intrathoracic lymph nodes: Secondary | ICD-10-CM | POA: Diagnosis present

## 2024-08-12 DIAGNOSIS — T380X5A Adverse effect of glucocorticoids and synthetic analogues, initial encounter: Secondary | ICD-10-CM | POA: Diagnosis present

## 2024-08-12 DIAGNOSIS — I130 Hypertensive heart and chronic kidney disease with heart failure and stage 1 through stage 4 chronic kidney disease, or unspecified chronic kidney disease: Secondary | ICD-10-CM | POA: Diagnosis present

## 2024-08-12 DIAGNOSIS — N179 Acute kidney failure, unspecified: Secondary | ICD-10-CM | POA: Diagnosis not present

## 2024-08-12 DIAGNOSIS — Z823 Family history of stroke: Secondary | ICD-10-CM

## 2024-08-12 DIAGNOSIS — J9621 Acute and chronic respiratory failure with hypoxia: Secondary | ICD-10-CM | POA: Diagnosis present

## 2024-08-12 DIAGNOSIS — C7971 Secondary malignant neoplasm of right adrenal gland: Secondary | ICD-10-CM | POA: Diagnosis present

## 2024-08-12 DIAGNOSIS — Z7189 Other specified counseling: Secondary | ICD-10-CM

## 2024-08-12 DIAGNOSIS — R1310 Dysphagia, unspecified: Secondary | ICD-10-CM | POA: Diagnosis not present

## 2024-08-12 DIAGNOSIS — I252 Old myocardial infarction: Secondary | ICD-10-CM

## 2024-08-12 DIAGNOSIS — C7931 Secondary malignant neoplasm of brain: Secondary | ICD-10-CM | POA: Diagnosis not present

## 2024-08-12 DIAGNOSIS — Z79899 Other long term (current) drug therapy: Secondary | ICD-10-CM

## 2024-08-12 DIAGNOSIS — N1831 Chronic kidney disease, stage 3a: Secondary | ICD-10-CM | POA: Diagnosis present

## 2024-08-12 DIAGNOSIS — Z9981 Dependence on supplemental oxygen: Secondary | ICD-10-CM

## 2024-08-12 DIAGNOSIS — R7989 Other specified abnormal findings of blood chemistry: Secondary | ICD-10-CM

## 2024-08-12 DIAGNOSIS — R0602 Shortness of breath: Secondary | ICD-10-CM

## 2024-08-12 DIAGNOSIS — E785 Hyperlipidemia, unspecified: Secondary | ICD-10-CM | POA: Diagnosis present

## 2024-08-12 DIAGNOSIS — E883 Tumor lysis syndrome: Secondary | ICD-10-CM | POA: Diagnosis not present

## 2024-08-12 DIAGNOSIS — Z801 Family history of malignant neoplasm of trachea, bronchus and lung: Secondary | ICD-10-CM

## 2024-08-12 DIAGNOSIS — C7951 Secondary malignant neoplasm of bone: Secondary | ICD-10-CM | POA: Diagnosis present

## 2024-08-12 DIAGNOSIS — Z923 Personal history of irradiation: Secondary | ICD-10-CM

## 2024-08-12 DIAGNOSIS — Z87891 Personal history of nicotine dependence: Secondary | ICD-10-CM

## 2024-08-12 DIAGNOSIS — E872 Acidosis, unspecified: Secondary | ICD-10-CM | POA: Diagnosis present

## 2024-08-12 DIAGNOSIS — R4589 Other symptoms and signs involving emotional state: Secondary | ICD-10-CM

## 2024-08-12 DIAGNOSIS — G893 Neoplasm related pain (acute) (chronic): Secondary | ICD-10-CM | POA: Diagnosis present

## 2024-08-12 DIAGNOSIS — J449 Chronic obstructive pulmonary disease, unspecified: Secondary | ICD-10-CM | POA: Diagnosis present

## 2024-08-12 DIAGNOSIS — D509 Iron deficiency anemia, unspecified: Secondary | ICD-10-CM | POA: Diagnosis present

## 2024-08-12 DIAGNOSIS — R972 Elevated prostate specific antigen [PSA]: Secondary | ICD-10-CM | POA: Diagnosis present

## 2024-08-12 DIAGNOSIS — R4182 Altered mental status, unspecified: Principal | ICD-10-CM

## 2024-08-12 DIAGNOSIS — Z833 Family history of diabetes mellitus: Secondary | ICD-10-CM

## 2024-08-12 DIAGNOSIS — Z8249 Family history of ischemic heart disease and other diseases of the circulatory system: Secondary | ICD-10-CM

## 2024-08-12 DIAGNOSIS — Z1152 Encounter for screening for COVID-19: Secondary | ICD-10-CM

## 2024-08-12 DIAGNOSIS — I2489 Other forms of acute ischemic heart disease: Secondary | ICD-10-CM | POA: Diagnosis present

## 2024-08-12 DIAGNOSIS — R64 Cachexia: Secondary | ICD-10-CM | POA: Diagnosis present

## 2024-08-12 DIAGNOSIS — A419 Sepsis, unspecified organism: Secondary | ICD-10-CM

## 2024-08-12 DIAGNOSIS — G939 Disorder of brain, unspecified: Secondary | ICD-10-CM

## 2024-08-12 DIAGNOSIS — Z66 Do not resuscitate: Secondary | ICD-10-CM | POA: Diagnosis present

## 2024-08-12 DIAGNOSIS — I2510 Atherosclerotic heart disease of native coronary artery without angina pectoris: Secondary | ICD-10-CM | POA: Diagnosis present

## 2024-08-12 DIAGNOSIS — C3490 Malignant neoplasm of unspecified part of unspecified bronchus or lung: Secondary | ICD-10-CM

## 2024-08-12 DIAGNOSIS — I480 Paroxysmal atrial fibrillation: Secondary | ICD-10-CM | POA: Diagnosis present

## 2024-08-12 DIAGNOSIS — I509 Heart failure, unspecified: Secondary | ICD-10-CM

## 2024-08-12 DIAGNOSIS — Z888 Allergy status to other drugs, medicaments and biological substances status: Secondary | ICD-10-CM

## 2024-08-12 DIAGNOSIS — Z711 Person with feared health complaint in whom no diagnosis is made: Secondary | ICD-10-CM

## 2024-08-12 DIAGNOSIS — Z8349 Family history of other endocrine, nutritional and metabolic diseases: Secondary | ICD-10-CM

## 2024-08-12 DIAGNOSIS — Z515 Encounter for palliative care: Secondary | ICD-10-CM

## 2024-08-12 DIAGNOSIS — G936 Cerebral edema: Secondary | ICD-10-CM | POA: Diagnosis present

## 2024-08-12 DIAGNOSIS — C3411 Malignant neoplasm of upper lobe, right bronchus or lung: Secondary | ICD-10-CM | POA: Diagnosis present

## 2024-08-12 DIAGNOSIS — Z7902 Long term (current) use of antithrombotics/antiplatelets: Secondary | ICD-10-CM

## 2024-08-12 DIAGNOSIS — Z7982 Long term (current) use of aspirin: Secondary | ICD-10-CM

## 2024-08-12 DIAGNOSIS — C3491 Malignant neoplasm of unspecified part of right bronchus or lung: Secondary | ICD-10-CM

## 2024-08-12 DIAGNOSIS — I5022 Chronic systolic (congestive) heart failure: Secondary | ICD-10-CM | POA: Diagnosis present

## 2024-08-12 DIAGNOSIS — R451 Restlessness and agitation: Secondary | ICD-10-CM

## 2024-08-12 DIAGNOSIS — Z836 Family history of other diseases of the respiratory system: Secondary | ICD-10-CM

## 2024-08-12 DIAGNOSIS — Z8419 Family history of other disorders of kidney and ureter: Secondary | ICD-10-CM

## 2024-08-12 DIAGNOSIS — Z955 Presence of coronary angioplasty implant and graft: Secondary | ICD-10-CM

## 2024-08-12 DIAGNOSIS — Z87440 Personal history of urinary (tract) infections: Secondary | ICD-10-CM

## 2024-08-12 DIAGNOSIS — Z9049 Acquired absence of other specified parts of digestive tract: Secondary | ICD-10-CM

## 2024-08-12 DIAGNOSIS — G9341 Metabolic encephalopathy: Secondary | ICD-10-CM | POA: Diagnosis present

## 2024-08-12 LAB — CBC WITH DIFFERENTIAL/PLATELET
Abs Immature Granulocytes: 0.12 K/uL — ABNORMAL HIGH (ref 0.00–0.07)
Basophils Absolute: 0.1 K/uL (ref 0.0–0.1)
Basophils Relative: 0 %
Eosinophils Absolute: 0.1 K/uL (ref 0.0–0.5)
Eosinophils Relative: 0 %
HCT: 32.1 % — ABNORMAL LOW (ref 39.0–52.0)
Hemoglobin: 9.8 g/dL — ABNORMAL LOW (ref 13.0–17.0)
Immature Granulocytes: 1 %
Lymphocytes Relative: 5 %
Lymphs Abs: 0.8 K/uL (ref 0.7–4.0)
MCH: 29.3 pg (ref 26.0–34.0)
MCHC: 30.5 g/dL (ref 30.0–36.0)
MCV: 96.1 fL (ref 80.0–100.0)
Monocytes Absolute: 1 K/uL (ref 0.1–1.0)
Monocytes Relative: 6 %
Neutro Abs: 14.5 K/uL — ABNORMAL HIGH (ref 1.7–7.7)
Neutrophils Relative %: 88 %
Platelets: 342 K/uL (ref 150–400)
RBC: 3.34 MIL/uL — ABNORMAL LOW (ref 4.22–5.81)
RDW: 14.6 % (ref 11.5–15.5)
WBC: 16.5 K/uL — ABNORMAL HIGH (ref 4.0–10.5)
nRBC: 0 % (ref 0.0–0.2)

## 2024-08-12 LAB — PRO BRAIN NATRIURETIC PEPTIDE: Pro Brain Natriuretic Peptide: 5355 pg/mL — ABNORMAL HIGH

## 2024-08-12 LAB — COMPREHENSIVE METABOLIC PANEL WITH GFR
ALT: 6 U/L (ref 0–44)
AST: 19 U/L (ref 15–41)
Albumin: 2.6 g/dL — ABNORMAL LOW (ref 3.5–5.0)
Alkaline Phosphatase: 121 U/L (ref 38–126)
Anion gap: 11 (ref 5–15)
BUN: 43 mg/dL — ABNORMAL HIGH (ref 8–23)
CO2: 25 mmol/L (ref 22–32)
Calcium: 10.6 mg/dL — ABNORMAL HIGH (ref 8.9–10.3)
Chloride: 101 mmol/L (ref 98–111)
Creatinine, Ser: 1.43 mg/dL — ABNORMAL HIGH (ref 0.61–1.24)
GFR, Estimated: 52 mL/min — ABNORMAL LOW
Glucose, Bld: 101 mg/dL — ABNORMAL HIGH (ref 70–99)
Potassium: 4.9 mmol/L (ref 3.5–5.1)
Sodium: 137 mmol/L (ref 135–145)
Total Bilirubin: 0.4 mg/dL (ref 0.0–1.2)
Total Protein: 7 g/dL (ref 6.5–8.1)

## 2024-08-12 LAB — I-STAT CG4 LACTIC ACID, ED
Lactic Acid, Venous: 2.1 mmol/L (ref 0.5–1.9)
Lactic Acid, Venous: 0.8 mmol/L (ref 0.5–1.9)

## 2024-08-12 LAB — I-STAT VENOUS BLOOD GAS, ED
Acid-Base Excess: 3 mmol/L — ABNORMAL HIGH (ref 0.0–2.0)
Bicarbonate: 27.8 mmol/L (ref 20.0–28.0)
Calcium, Ion: 1.31 mmol/L (ref 1.15–1.40)
HCT: 30 % — ABNORMAL LOW (ref 39.0–52.0)
Hemoglobin: 10.2 g/dL — ABNORMAL LOW (ref 13.0–17.0)
O2 Saturation: 92 %
Potassium: 4.7 mmol/L (ref 3.5–5.1)
Sodium: 139 mmol/L (ref 135–145)
TCO2: 29 mmol/L (ref 22–32)
pCO2, Ven: 43.2 mmHg — ABNORMAL LOW (ref 44–60)
pH, Ven: 7.416 (ref 7.25–7.43)
pO2, Ven: 64 mmHg — ABNORMAL HIGH (ref 32–45)

## 2024-08-12 LAB — I-STAT CHEM 8, ED
BUN: 41 mg/dL — ABNORMAL HIGH (ref 8–23)
Calcium, Ion: 1.3 mmol/L (ref 1.15–1.40)
Chloride: 102 mmol/L (ref 98–111)
Creatinine, Ser: 1.7 mg/dL — ABNORMAL HIGH (ref 0.61–1.24)
Glucose, Bld: 101 mg/dL — ABNORMAL HIGH (ref 70–99)
HCT: 29 % — ABNORMAL LOW (ref 39.0–52.0)
Hemoglobin: 9.9 g/dL — ABNORMAL LOW (ref 13.0–17.0)
Potassium: 4.8 mmol/L (ref 3.5–5.1)
Sodium: 139 mmol/L (ref 135–145)
TCO2: 26 mmol/L (ref 22–32)

## 2024-08-12 LAB — TROPONIN T, HIGH SENSITIVITY
Troponin T High Sensitivity: 33 ng/L — ABNORMAL HIGH (ref 0–19)
Troponin T High Sensitivity: 48 ng/L — ABNORMAL HIGH (ref 0–19)

## 2024-08-12 LAB — MAGNESIUM: Magnesium: 2.4 mg/dL (ref 1.7–2.4)

## 2024-08-12 MED ORDER — SODIUM CHLORIDE 0.9 % IV SOLN
2.0000 g | Freq: Once | INTRAVENOUS | Status: AC
  Administered 2024-08-12: 2 g via INTRAVENOUS
  Filled 2024-08-12: qty 12.5

## 2024-08-12 MED ORDER — FUROSEMIDE 10 MG/ML IJ SOLN
80.0000 mg | Freq: Once | INTRAMUSCULAR | Status: AC
  Administered 2024-08-12: 80 mg via INTRAVENOUS
  Filled 2024-08-12: qty 8

## 2024-08-12 MED ORDER — DEXAMETHASONE SOD PHOSPHATE PF 10 MG/ML IJ SOLN
10.0000 mg | Freq: Once | INTRAMUSCULAR | Status: AC
  Administered 2024-08-12: 10 mg via INTRAVENOUS
  Filled 2024-08-12: qty 1

## 2024-08-12 MED ORDER — VANCOMYCIN HCL 1250 MG/250ML IV SOLN
1250.0000 mg | Freq: Once | INTRAVENOUS | Status: AC
  Administered 2024-08-12: 1250 mg via INTRAVENOUS
  Filled 2024-08-12: qty 250

## 2024-08-12 MED ORDER — IOHEXOL 350 MG/ML SOLN
75.0000 mL | Freq: Once | INTRAVENOUS | Status: AC | PRN
  Administered 2024-08-12: 75 mL via INTRAVENOUS

## 2024-08-12 MED ORDER — DEXAMETHASONE SODIUM PHOSPHATE 4 MG/ML IJ SOLN
4.0000 mg | Freq: Four times a day (QID) | INTRAMUSCULAR | Status: DC
  Administered 2024-08-13 – 2024-08-14 (×5): 4 mg via INTRAVENOUS
  Filled 2024-08-12 (×5): qty 1

## 2024-08-12 MED ORDER — GADOBUTROL 1 MMOL/ML IV SOLN
7.0000 mL | Freq: Once | INTRAVENOUS | Status: AC | PRN
  Administered 2024-08-12: 7 mL via INTRAVENOUS

## 2024-08-12 MED ORDER — LACTATED RINGERS IV BOLUS: 1000.0000 mL | Freq: Once | INTRAVENOUS | Status: DC

## 2024-08-12 NOTE — ED Provider Notes (Signed)
 " [place] EMERGENCY DEPARTMENT AT [facility] Provider Note   CSN: [id number] Arrival date & time: 08/12/24  1743     Patient presents with: Altered Mental Status   Jeffrey Norton is a 74 y.o. male with a PMHx notable CAD, PAF/A-flutter, chronic systolic heart failure, pericarditis, PAD, hypertension, hyperlipidemia and CKD stage III, COPD, newly diagnosed right lung mass who presents today for evaluation of altered mental status.  Patient is nonverbal on my initial assessment and unable to contribute history.  Patient reportedly had went to a local gas station.  When he left home was noted to be at his baseline per wife.  Was noted to be confused by the source staff.  At the time of initial EMS assessment patient was noted to be weak and unable to stand without significant assistance.  Patient also without any significant verbal response.  There is tremor activity noted by staff at the store.  Was subsequent brought to our facility for further evaluation.   Altered Mental Status      Prior to Admission medications  Medication Sig Start Date End Date Taking? Authorizing Provider  amLODipine  (NORVASC ) 5 MG tablet Take 5 mg by mouth daily. Patient not taking: Reported on 08/04/2024 09/02/23   [provider]  amoxicillin -clavulanate (AUGMENTIN ) 875-125 MG tablet Take 1 tablet by mouth every 12 (twelve) hours. 05/25/24   Francis Ileana SAILOR, PA-C  APPLE CIDER VINEGAR PO Take 1 Dose by mouth See admin instructions. 2 teaspoons honey + apple cider vinegar to thin, every 6 hours as needed for cough.    [provider]  Ascorbic Acid (VITAMIN C PO) Take 1 tablet by mouth daily.    [provider]  aspirin  EC 81 MG tablet Take 81 mg by mouth every other day.    [provider]  b complex vitamins capsule Take 1 capsule by mouth daily.    [provider]  budesonide -glycopyrrolate -formoterol  (BREZTRI  AEROSPHERE) 160-9-4.8 MCG/ACT AERO inhaler Inhale  2 puffs into the lungs in the morning and at bedtime. 07/03/24   Hattar, Zola SAILOR, MD  clopidogrel  (PLAVIX ) 75 MG tablet Take 1 tablet (75 mg total) by mouth daily. 06/08/23   Court Dorn PARAS, MD  Coenzyme Q10 (COQ10 PO) Take 1 capsule by mouth daily.    [provider]  colchicine  0.6 MG tablet Take 1 tablet (0.6 mg total) by mouth daily. 03/06/23   Pokhrel, Laxman, MD  ergocalciferol (VITAMIN D2) 1.25 MG (50000 UT) capsule Take 50,000 Units by mouth daily.    [provider]  furosemide  (LASIX ) 40 MG tablet Take 1 tablet (40 mg total) by mouth daily. As needed for fluid in legs, weight gain 03/06/23   Pokhrel, Vernal, MD  guaiFENesin -codeine  100-10 MG/5ML syrup Take 5 mLs by mouth at bedtime as needed for cough. 07/26/24   Hattar, Zola SAILOR, MD  ipratropium-albuterol  (DUONEB) 0.5-2.5 (3) MG/3ML SOLN Take 3 mLs by nebulization every 4 (four) hours as needed. 07/03/24   Hattar, Zola SAILOR, MD  MAGNESIUM PO Take 1 tablet by mouth daily.    [provider]  metoprolol  succinate (TOPROL -XL) 25 MG 24 hr tablet Take 1 tablet (25 mg total) by mouth daily. 06/08/23   Court Dorn PARAS, MD  Multiple Vitamins-Minerals (ZINC PO) Take 1 tablet by mouth daily.    [provider]  nitroGLYCERIN  (NITROSTAT ) 0.4 MG SL tablet Place 1 tablet (0.4 mg total) under the tongue every 5 (five) minutes as needed for chest pain. 07/08/22  Henry Manuelita NOVAK, NP  olmesartan (BENICAR) 40 MG tablet Take 40 mg by mouth daily. 05/26/23   [provider]  pantoprazole  (PROTONIX ) 40 MG tablet Take 1 tablet (40 mg total) by mouth daily. 03/06/23   Pokhrel, Laxman, MD  phytonadione (VITAMIN K) 5 MG tablet Take 1 tablet by mouth daily.    [provider]  QUERCETIN PO Take 1 tablet by mouth daily.    [provider]  vitamin E 180 MG (400 UNITS) capsule Take 400 Units by mouth daily.    [provider]    Allergies: Heparin     Review of Systems  Updated Vital  Signs Pulse (!) 135   Resp (!) 25   SpO2 91%   Physical Exam Constitutional:      General: He is not in acute distress.    Appearance: Normal appearance. He is ill-appearing.  HENT:     Head: Normocephalic.     Right Ear: External ear normal.     Left Ear: External ear normal.     Nose: Nose normal.     Mouth/Throat:     Mouth: Mucous membranes are moist.  Eyes:     Pupils: Pupils are equal, round, and reactive to light.  Cardiovascular:     Rate and Rhythm: Normal rate and regular rhythm.     Pulses: Normal pulses.  Pulmonary:     Effort: Pulmonary effort is normal. No respiratory distress.     Breath sounds: Normal breath sounds.  Abdominal:     General: Abdomen is flat. There is no distension.     Palpations: Abdomen is soft.     Tenderness: There is no abdominal tenderness. There is no guarding.  Musculoskeletal:        General: Normal range of motion.  Skin:    General: Skin is warm and dry.     Capillary Refill: Capillary refill takes less than 2 seconds.  Neurological:     General: No focal deficit present.     Mental Status: He is alert.     Cranial Nerves: No cranial nerve deficit.     Sensory: No sensory deficit.     Motor: No weakness.     Comments: Briskly following commands however largely nonverbal.  Is able to state his name however otherwise not answering questions regarding orientation questions or naming objects.     (all labs ordered are listed, but only abnormal results are displayed) Labs Reviewed  CBC WITH DIFFERENTIAL/PLATELET - Abnormal; Notable for the following components:      Result Value   WBC 16.5 (*)    RBC 3.34 (*)    Hemoglobin 9.8 (*)    HCT 32.1 (*)    Neutro Abs 14.5 (*)    Abs Immature Granulocytes 0.12 (*)    All other components within normal limits  COMPREHENSIVE METABOLIC PANEL WITH GFR - Abnormal; Notable for the following components:   Glucose, Bld 101 (*)    BUN 43 (*)    Creatinine, Ser 1.43 (*)    Calcium  10.6 (*)     Albumin 2.6 (*)    GFR, Estimated 52 (*)    All other components within normal limits  PRO BRAIN NATRIURETIC PEPTIDE - Abnormal; Notable for the following components:   Pro Brain Natriuretic Peptide 5,355.0 (*)    All other components within normal limits  I-STAT CHEM 8, ED - Abnormal; Notable for the following components:   BUN 41 (*)    Creatinine, Ser 1.70 (*)  Glucose, Bld 101 (*)    Hemoglobin 9.9 (*)    HCT 29.0 (*)    All other components within normal limits  I-STAT VENOUS BLOOD GAS, ED - Abnormal; Notable for the following components:   pCO2, Ven 43.2 (*)    pO2, Ven 64 (*)    Acid-Base Excess 3.0 (*)    HCT 30.0 (*)    Hemoglobin 10.2 (*)    All other components within normal limits  I-STAT CG4 LACTIC ACID, ED - Abnormal; Notable for the following components:   Lactic Acid, Venous 2.1 (*)    All other components within normal limits  TROPONIN T, HIGH SENSITIVITY - Abnormal; Notable for the following components:   Troponin T High Sensitivity 33 (*)    All other components within normal limits  TROPONIN T, HIGH SENSITIVITY - Abnormal; Notable for the following components:   Troponin T High Sensitivity 48 (*)    All other components within normal limits  MAGNESIUM  URINALYSIS, ROUTINE W REFLEX MICROSCOPIC  I-STAT CG4 LACTIC ACID, ED    EKG: None  Radiology: CT ABDOMEN PELVIS W CONTRAST Result Date: 08/12/2024 EXAM: CT ABDOMEN AND PELVIS WITH CONTRAST 08/12/2024 10:42:24 PM TECHNIQUE: CT of the abdomen and pelvis was performed with the administration of 75 mL of iohexol  (OMNIPAQUE ) 350 MG/ML injection. Multiplanar reformatted images are provided for review. Automated exposure control, iterative reconstruction, and/or weight-based adjustment of the mA/kV was utilized to reduce the radiation dose to as low as reasonably achievable. COMPARISON: CT abdomen and pelvis 10/24/2006. CLINICAL HISTORY: Metastatic disease evaluation. FINDINGS: LOWER CHEST: There are increasing  fibrotic changes in the lung bases. LIVER: The liver is unremarkable. GALLBLADDER AND BILE DUCTS: The gallbladder is surgically absent. No biliary ductal dilatation. SPLEEN: No acute abnormality. PANCREAS: No acute abnormality. ADRENAL GLANDS: There is an enlarging 6.1 x 4.9 cm right adrenal mass arg . Left adrenal gland within normal limits. KIDNEYS, URETERS AND BLADDER: There is a punctate calculus in the inferior pole of the right kidney. There are bilateral renal cysts measuring up to 2.8 cm on the right and 3.5 cm on the left. Per consensus, no follow-up is needed for simple Bosniak type 1 and 2 renal cysts, unless the patient has a malignancy history or risk factors. No hydronephrosis. No perinephric or periureteral stranding. Urinary bladder is unremarkable. GI AND BOWEL: Stomach demonstrates no acute abnormality. Sigmoid and descending colon diverticulosis is present. The appendix appears normal. There is no bowel obstruction. PERITONEUM AND RETROPERITONEUM: No ascites. No free air. VASCULATURE: Aorta is normal in caliber. Severe atherosclerotic disease throughout the aorta. 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 LYMPH NODES: No lymphadenopathy. REPRODUCTIVE ORGANS: There are calcifications in the prostate gland. BONES AND SOFT TISSUES: No acute osseous abnormality. No focal soft tissue abnormality. IMPRESSION: 1. No acute findings. 2. Enlarging 6.1 x 4.9 cm right adrenal mass. 3. 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 Electronically signed by: Greig Pique MD 08/12/2024 11:05 PM EST RP Workstation: [id number]   CT ANGIO HEAD NECK W WO CM Result Date: 08/12/2024 EXAM: CTA Head and Neck with and without Intravenous Contrast. CT Head without Contrast. CLINICAL HISTORY: Neuro deficit, acute, stroke suspected. TECHNIQUE: Axial CTA images of the head and neck performed with and without intravenous contrast. MIP reconstructed images were created and  reviewed. Axial computed tomography images of the head/brain performed without intravenous contrast. Note: Per PQRS, the description of internal carotid artery percent stenosis, including 0 percent or  normal exam, is based on [employer] Symptomatic Carotid Endarterectomy Trial (NASCET) criteria. Dose reduction technique was used including one or more of the following: automated exposure control, adjustment of mA and kV according to patient size, and/or iterative reconstruction. CONTRAST: With and without; 75 mL iohexol  (OMNIPAQUE ) 350 MG/ML injection. COMPARISON: None provided. FINDINGS: CT HEAD: BRAIN: There are hyperdense masses within the brain. The first is at the left frontal lobe and measures 3.3 x 3.1 cm with moderate surrounding vasogenic edema. The second is at the medial right occipital lobe and measures 1.8 x 1.8 cm with moderate surrounding vasogenic edema. A third lesion at the superior right frontal lobe measures 9 mm with mild surrounding vasogenic edema. No CT evidence for acute territorial infarct. No midline shift or extra-axial collection. VENTRICLES: No hydrocephalus. ORBITS: The orbits are unremarkable. SINUSES AND MASTOIDS: The paranasal sinuses and mastoid air cells are clear. CTA NECK: COMMON CAROTID ARTERIES: There is mixed density atherosclerotic disease at both carotid bifurcations. On the right, this results in less than 50% stenosis. On the left, it results in approximately 50% stenosis. No dissection or occlusion. INTERNAL CAROTID ARTERIES: Both internal carotid arteries remain widely patent to the skull base. No stenosis by NASCET criteria. No dissection or occlusion. VERTEBRAL ARTERIES: Moderate stenosis at the origin of the left vertebral artery which is otherwise widely patent with mild multifocal atherosclerotic calcification. No dissection or occlusion. CTA HEAD: ANTERIOR CEREBRAL ARTERIES: No significant stenosis. No occlusion. No aneurysm. MIDDLE CEREBRAL ARTERIES: No  significant stenosis. No occlusion. No aneurysm. POSTERIOR CEREBRAL ARTERIES: Fetal origin of the right PCA. No significant stenosis. No occlusion. No aneurysm. BASILAR ARTERY: No significant stenosis. No occlusion. No aneurysm. OTHER: There is mild atherosclerotic calcification of the carotid siphons. SOFT TISSUES: No acute finding. No masses or lymphadenopathy. BONES: No acute osseous abnormality. LUNGS: Complete collapse of the visible portion of the right lung with a large amount of right-sided pleural effusion incompletely visualized. IMPRESSION: 1. Hyperdense masses within the brain, including a 3.3 x 3.1 cm left frontal lobe lesion, a 1.8 x 1.8 cm medial right occipital lobe lesion, and a 9 mm superior right frontal lobe lesion, with associated vasogenic edema. This is likely metastatic disease and MRI with and without contrast is recommended for further evaluation. 2. Moderate stenosis at the origin of the left vertebral artery, which is otherwise widely patent with mild multifocal atherosclerotic calcification. 3. Mixed density atherosclerotic disease at both carotid bifurcations, resulting in less than 50% stenosis on the right and approximately 50% stenosis on the left, with both internal carotid arteries widely patent to the skull base. 4. Complete collapse of the visible portion of the right lung with a large amount of right-sided pleural effusion, incompletely visualized. Electronically signed by: Franky Stanford MD 08/12/2024 08:43 PM EST RP Workstation: [id number]   CT Angio Chest PE W and/or Wo Contrast Result Date: 08/12/2024 EXAM: CTA CHEST 08/12/2024 08:14:23 PM TECHNIQUE: CTA of the chest was performed without and with intravenous contrast. 75 mL of iohexol  (OMNIPAQUE ) 350 MG/ML injection was administered. Multiplanar reformatted images are provided for review. MIP images are provided for review. Automated exposure control, iterative reconstruction, and/or weight based adjustment of the mA/kV  was utilized to reduce the radiation dose to as low as reasonably achievable. COMPARISON: PET CT dated 07/12/2024. CLINICAL HISTORY: Pulmonary embolism (PE) suspected, high prob. FINDINGS: PULMONARY ARTERIES: Pulmonary arteries are adequately opacified for evaluation. No acute pulmonary embolus. Main pulmonary artery is normal in caliber. MEDIASTINUM: Leftward cardiomediastinal shift. Moderate  3 vessel coronary atherosclerosis. Thoracic aortic atherosclerosis. LYMPH NODES: Small mediastinal and right hilar nodes, including the 13 mm short axis right paratracheal node, grossly unchanged. LUNGS AND PLEURA: 16.4 cm right upper lobe mass, corresponding to the patient's known primary bronchogenic carcinoma, previously 13.8 cm. No focal consolidation or pulmonary edema. No evidence of pleural effusion or pneumothorax. UPPER ABDOMEN: Status post cholecystectomy. 6.2 cm right adrenal mass (image 151) previously 4.7 cm. SOFT TISSUES AND BONES: Mild erosion of the right lateral 3rd through 5th ribs, reflecting osseous invasion. No acute soft tissue abnormality. IMPRESSION: 1. No pulmonary embolism. 2. Enlarging 16.4 cm right upper lobe mass, reflecting primary bronchogenic carcinoma. 3. Associated small thoracic nodal metastases, grossly unchanged. 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 4. Osseous invasion of the right lateral 3rd-5th ribs, similar to prior. Electronically signed by: Pinkie Pebbles MD 08/12/2024 08:40 PM EST RP Workstation: [id number]   DG Chest Port 1 View Result Date: 08/12/2024 EXAM: 1 VIEW(S) XRAY OF THE CHEST 08/12/2024 07:37:00 PM COMPARISON: PET CT dated 07/12/2024. CLINICAL HISTORY: fatigue fatigue fatigue FINDINGS: LUNGS AND PLEURA: Large right upper lobe mass, compatible with primary bronchogenic carcinoma. Coarse interstitial markings and scarring in the bilateral mid and lower lungs. No pleural effusion. No pneumothorax. HEART AND MEDIASTINUM: No acute abnormality of the cardiac and  mediastinal silhouettes. BONES AND SOFT TISSUES: No acute osseous abnormality. IMPRESSION: 1. Large right upper lobe mass, compatible with primary bronchogenic carcinoma. Electronically signed by: Pinkie Pebbles MD 08/12/2024 07:45 PM EST RP Workstation: [id number]    Procedures   Medications Ordered in the ED  vancomycin  (VANCOREADY) IVPB 1250 mg/250 mL (1,250 mg Intravenous New Bag/Given 08/12/24 2158)  dexamethasone  (DECADRON ) injection 4 mg (has no administration in time range)  gadobutrol  (GADAVIST ) 1 MMOL/ML injection 7 mL (has no administration in time range)  iohexol  (OMNIPAQUE ) 350 MG/ML injection 75 mL (75 mLs Intravenous Contrast Given 08/12/24 2026)  ceFEPIme  (MAXIPIME ) 2 g in sodium chloride  0.9 % 100 mL IVPB (2 g Intravenous New Bag/Given 08/12/24 2154)  furosemide  (LASIX ) injection 80 mg (80 mg Intravenous Given 08/12/24 2150)  dexamethasone  (DECADRON ) injection 10 mg (10 mg Intravenous Given 08/12/24 2149)  iohexol  (OMNIPAQUE ) 350 MG/ML injection 75 mL (75 mLs Intravenous Contrast Given 08/12/24 2242)    Clinical Course as of 08/12/24 2302  Sat Aug 12, 2024  2055 CT ANGIO HEAD NECK LELON CROMER CM [DZ]    Clinical Course User Index [DZ] Laurita Sieving, MD                               Medical Decision Making Amount and/or Complexity of Data Reviewed Labs: ordered. Radiology: ordered. Decision-making details documented in ED Course. ECG/medicine tests: ordered.  Risk Prescription drug management. Decision regarding hospitalization.   Patient is a 74 year old male who presents today for evaluation of altered mental status.  On initial assessment patient was noted to be tachycardic with heart rates in the 130s.  Blood pressure stable.  Patient does have some mild tachypnea and noted to be hypoxic on his home 2 L of oxygen .  Poor overall pulmonary effort however no audible wheezing or rhonchi noted on my assessment.  Lower extremities without any evidence of edema.  Patient has no  focal peripheral deficits and no obvious cranial nerve deficits.  Able to follow commands briskly however remains without significant verbal response.  Able to state his name but otherwise unable to name objects or answer additional orientation questions.  I did perform a bedside ultrasound that did not show any evidence of significant reduced ejection fraction.  Patient does have some wall motion abnormalities of the septum and otherwise without any evidence of pericardial effusion, dilated right heart.  IVC noted to be collapsible.  We also performed an EKG here in the emergency department that did show sinus tachycardia with slight elevations in lead III as well as aVF.  Differential remains broad at this point in time.  Based on chart review patient had a recent call to the hematology offic where wife was concerned about decreased p.o. intake and possible dehydration.  Patient had declined to come to the hospital at that point time for evaluation.  With collapsible IVC, metabolic derangement, dehydration may be contributing factor.  With his altered mental status CVA, ICH is also considered.  Patient is also scheduled for IR guided biopsy and has been off of Eliquis  for several days.  With this new lung mass, tachycardia and worsening hypoxia, pulmonary embolism also considered.  Additional considerations including pneumonia, ACS, UTI.  Review of laboratory evaluation most notable for leukocytosis of 16.5, elevated lactic acid, elevated troponin, BNP 5300.  CTA PE without evidence of acute embolism however redemonstrated right lung mass with pleural effusion right.  CTA head with evidence of multiple metastatic lesions in the brain the largest of which is 3 x 3 cm in the left frontal lobe.  With patient's increasing oxygen  requirement, leukocytosis as well as elevated lactic acid will provide antibiotics for suspected sepsis.  Patient was provided vancomycin  and cefepime .  IV fluids deferred due to  elevated BNP from baseline.  Patient was provided 80 mg of Lasix  for initial diuresis.  I suspect patient's mental status changes secondary to new metastatic lesions.  Both neurology and neurosurgery consulted.  Recommendations at this time for 10 mg of Decadron  and then scheduled 4 mg of Decadron  every 6 hours.  No emergent surgical intervention per neurosurgery however recommendations to hold all anticoagulation, all antiplatelets and aspirin .  Patient may need biopsy moving forward.  Patient was also admitted to the hospital service for further management.   Final diagnoses:  Altered mental status, unspecified altered mental status type  Brain lesion    ED Discharge Orders     None          Laurita Sieving, MD 08/12/24 2308    Emil Share, DO 08/12/24 2312  "

## 2024-08-12 NOTE — Progress Notes (Signed)
 74 y/o M w/ multiple comorbidities including NSTEMI on aspirin  plavix , known lung mass not yet formally diagnosed who presents with confusion vs aphasia. CT head shows concern for metastatic disease including 3 cm L frontal lesion  Recommendations: MRI brain w/wo Complete body imaging with CT-AP w/ contrast Decadron  4q6 Hold all anticoagulation and antiplatelets Anticipate will need IR vs pulm biopsy before deciding on treatment plan for brain Ok for general floor from my [facility] Diet as tolerated from my standpoint

## 2024-08-12 NOTE — ED Notes (Signed)
 Patient currently in CT.  Wife at bedside updated on plan of care

## 2024-08-12 NOTE — H&P (Signed)
 " History and Physical    Jeffrey Norton FMW:[id number] DOB: [birth year] DOA: 08/12/2024  PCP: Dayna Motto, DO  Chief Complaint: AMS  HPI: LAWRNCE Norton is a 74 y.o. male with medical history significant of CAD status post multiple PCIs, paroxysmal A-fib/flutter for which patient had previously declined anticoagulation, HFmrEF, pericarditis/pericardial effusion, CKD stage IIIa, hypertension, hyperlipidemia, PVD, GERD, GI bleed, chronic hypoxemic respiratory failure on 2 L home oxygen .  History of right upper lobe lung mass suspicious for malignancy with recent PET scan done a month ago showing stage IV primary bronchogenic carcinoma with ipsilateral hilar/mediastinal adenopathy and metastasis to right adrenal gland and right 3rd, 4th, and 5th lateral ribs.    Patient presents to the ED via EMS for evaluation of acute onset altered mental status/confusion, aphasia, and generalized weakness.  Tachycardic and tachypneic on arrival to the ED.  Desaturated to mid-high 80s on his home 2 L [place] and oxygen  saturation improved with 4 L [place].  Not hypotensive.  Temperature not checked.  Labs notable for WBC count 16.5, hemoglobin 9.8, glucose 101, creatinine 1.4 (stable), calcium  10.6, albumin 2.6, normal LFTs, UA pending, troponin 33> 48, proBNP 5355, magnesium 2.4, VBG with pH 7.41 and pCO2 43.2, lactic acid 2.1> 0.8.  EKG showing sinus tachycardia and no acute ischemic changes.  CTA chest showing: IMPRESSION: 1. No pulmonary embolism. 2. Enlarging 16.4 cm right upper lobe mass, reflecting primary bronchogenic carcinoma. 3. Associated small thoracic nodal metastases, grossly unchanged. 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 4. Osseous invasion of the right lateral 3rd-5th ribs, similar to prior.  CTA head and neck showing: IMPRESSION: 1. Hyperdense masses within the brain, including a 3.3 x 3.1 cm left frontal lobe lesion, a 1.8 x 1.8 cm medial right occipital lobe lesion, and a 9  mm superior right frontal lobe lesion, with associated vasogenic edema. This is likely metastatic disease and MRI with and without contrast is recommended for further evaluation. 2. Moderate stenosis at the origin of the left vertebral artery, which is otherwise widely patent with mild multifocal atherosclerotic calcification. 3. Mixed density atherosclerotic disease at both carotid bifurcations, resulting in less than 50% stenosis on the right and approximately 50% stenosis on the left, with both internal carotid arteries widely patent to the skull base. 4. Complete collapse of the visible portion of the right lung with a large amount of right-sided pleural effusion, incompletely visualized.  CT abdomen pelvis with contrast showing: IMPRESSION: 1. No acute findings. 2. Enlarging 6.1 x 4.9 cm right adrenal mass. 3. 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  Patient was given IV Decadron  10 mg, IV Lasix  80 mg, vancomycin , and cefepime  in the ED.  Neurosurgery consulted and brain MRI ordered.  TRH called to admit.  Patient is confused and history provided mostly by his wife at bedside.  Wife states patient went to a gas station today around 5 PM where he was noted to be confused and staring so they called EMS.  He was diagnosed with lung cancer at the end of October and was seen by pulmonology in December and they scheduled him for biopsy which is supposed to be tomorrow on Monday.  As such, he has not had any aspirin  or Plavix  since Wednesday 1/14.  Not on anticoagulation.  He has lost over 20 pounds since October.  He stopped smoking cigarettes 25 years ago.  Patient denies chest pain, shortness of breath, or abdominal pain.  No fevers, cough, vomiting, or diarrhea  reported.  Wife states patient was recently treated for UTI and finished taking ciprofloxacin 1.5 weeks ago.  Review of Systems:  Review of Systems  All other systems reviewed and are  negative.   Past Medical History:  Diagnosis Date   Coronary artery disease    Hyperlipidemia    Hypertension    Myocardial infarction [facility])    PVD (peripheral vascular disease)    S/P angioplasty with stent to Lt. iliac 08/15/12 08/15/2012    Past Surgical History:  Procedure Laterality Date   2D Echocardiogram  08/25/2004   EF >55%, LA-moderately dilated.   BIOPSY  03/05/2023   Procedure: BIOPSY;  Surgeon: Rollin Dover, MD;  Location: [facility] ENDOSCOPY;  Service: Gastroenterology;;   CARDIAC CATHETERIZATION  03/11/2002   RCA subtotally occluded with TIMI-1 to1/2 flow stented with a 3x23 Cypher DES resulting in reduction of occlusion to 0% with TIMI-3 flow   CORONARY BALLOON ANGIOPLASTY N/A 02/22/2023   Procedure: CORONARY BALLOON ANGIOPLASTY;  Surgeon: Dann Candyce RAMAN, MD;  Location: [facility] INVASIVE CV LAB;  Service: Cardiovascular;  Laterality: N/A;   CORONARY THROMBECTOMY N/A 02/22/2023   Procedure: Coronary Thrombectomy;  Surgeon: Dann Candyce RAMAN, MD;  Location: [facility] INVASIVE CV LAB;  Service: Cardiovascular;  Laterality: N/A;   ESOPHAGOGASTRODUODENOSCOPY (EGD) WITH PROPOFOL  N/A 03/05/2023   Procedure: ESOPHAGOGASTRODUODENOSCOPY (EGD) WITH PROPOFOL ;  Surgeon: Rollin Dover, MD;  Location: [facility] ENDOSCOPY;  Service: Gastroenterology;  Laterality: N/A;   LEA Doppler  08/29/2012   L SFA Proximal: 70-99% diameter reduction. Velocities suggest upper end of range-new finding when compared to previous study. L EIA stent: opne and patent without evidence of restenosis   LEFT HEART CATH AND CORONARY ANGIOGRAPHY N/A 07/07/2022   Procedure: LEFT HEART CATH AND CORONARY ANGIOGRAPHY;  Surgeon: Jordan, Peter M, MD;  Location: [facility] INVASIVE CV LAB;  Service: Cardiovascular;  Laterality: N/A;   LEFT HEART CATH AND CORONARY ANGIOGRAPHY N/A 02/22/2023   Procedure: LEFT HEART CATH AND CORONARY ANGIOGRAPHY;  Surgeon: Dann Candyce RAMAN, MD;  Location: [facility] INVASIVE CV LAB;  Service: Cardiovascular;  Laterality: N/A;    Lexiscan  Myoview  07/19/2012   No significant ECG changes, mild inferobasal hypokinesis with normal LV function, low risk stress nuclear study   PV Intervention  08/15/2012   Occluded L External Iliac-stented with a 10x49mm Abbott Nitinol Absolute Pro self-expanding stent. 75% stenosis in L Common Femoral artery stented witha 8x50mm lon Absolute Nitinol Absolute Po self-expanding stent.     reports that he quit smoking about 26 years ago. His smoking use included cigarettes. He has never used smokeless tobacco. He reports that he does not drink alcohol and does not use drugs.  Allergies[1]  Family History  Problem Relation Age of Onset   Diabetes Mother    Heart disease Mother 33   Hyperlipidemia Mother    Kidney disease Mother    Heart disease Father 61   Cancer Father 9   Stroke Maternal Grandfather        Mid 4s   Lung disease Paternal Grandfather        Black Lung Disease    Prior to Admission medications  Medication Sig Start Date End Date Taking? Authorizing Provider  amLODipine  (NORVASC ) 5 MG tablet Take 5 mg by mouth daily. Patient not taking: Reported on 08/04/2024 09/02/23   [provider]  amoxicillin -clavulanate (AUGMENTIN ) 875-125 MG tablet Take 1 tablet by mouth every 12 (twelve) hours. 05/25/24   Francis Ileana SAILOR, PA-C  APPLE CIDER VINEGAR PO Take 1 Dose by  mouth See admin instructions. 2 teaspoons honey + apple cider vinegar to thin, every 6 hours as needed for cough.    [provider]  Ascorbic Acid (VITAMIN C PO) Take 1 tablet by mouth daily.    [provider]  aspirin  EC 81 MG tablet Take 81 mg by mouth every other day.    [provider]  b complex vitamins capsule Take 1 capsule by mouth daily.    [provider]  budesonide -glycopyrrolate -formoterol  (BREZTRI  AEROSPHERE) 160-9-4.8 MCG/ACT AERO inhaler Inhale 2 puffs into the lungs in the morning and at bedtime. 07/03/24   Hattar, Zola SAILOR, MD  clopidogrel  (PLAVIX ) 75  MG tablet Take 1 tablet (75 mg total) by mouth daily. 06/08/23   Court Dorn PARAS, MD  Coenzyme Q10 (COQ10 PO) Take 1 capsule by mouth daily.    [provider]  colchicine  0.6 MG tablet Take 1 tablet (0.6 mg total) by mouth daily. 03/06/23   Pokhrel, Laxman, MD  ergocalciferol (VITAMIN D2) 1.25 MG (50000 UT) capsule Take 50,000 Units by mouth daily.    [provider]  furosemide  (LASIX ) 40 MG tablet Take 1 tablet (40 mg total) by mouth daily. As needed for fluid in legs, weight gain 03/06/23   Pokhrel, Vernal, MD  guaiFENesin -codeine  100-10 MG/5ML syrup Take 5 mLs by mouth at bedtime as needed for cough. 07/26/24   Hattar, Zola SAILOR, MD  ipratropium-albuterol  (DUONEB) 0.5-2.5 (3) MG/3ML SOLN Take 3 mLs by nebulization every 4 (four) hours as needed. 07/03/24   Hattar, Zola SAILOR, MD  MAGNESIUM PO Take 1 tablet by mouth daily.    [provider]  metoprolol  succinate (TOPROL -XL) 25 MG 24 hr tablet Take 1 tablet (25 mg total) by mouth daily. 06/08/23   Court Dorn PARAS, MD  Multiple Vitamins-Minerals (ZINC PO) Take 1 tablet by mouth daily.    [provider]  nitroGLYCERIN  (NITROSTAT ) 0.4 MG SL tablet Place 1 tablet (0.4 mg total) under the tongue every 5 (five) minutes as needed for chest pain. 07/08/22   Henry Manuelita NOVAK, NP  olmesartan (BENICAR) 40 MG tablet Take 40 mg by mouth daily. 05/26/23   [provider]  pantoprazole  (PROTONIX ) 40 MG tablet Take 1 tablet (40 mg total) by mouth daily. 03/06/23   Pokhrel, Laxman, MD  phytonadione (VITAMIN K) 5 MG tablet Take 1 tablet by mouth daily.    [provider]  QUERCETIN PO Take 1 tablet by mouth daily.    [provider]  vitamin E 180 MG (400 UNITS) capsule Take 400 Units by mouth daily.    [provider]    Physical Exam: Vitals:   08/12/24 1754 08/12/24 1755 08/12/24 2000  BP: 111/64    Pulse:  (!) 135 (!) 115  Resp: (!) 27 (!) 25   SpO2:  91% 97%    Physical  Exam Vitals reviewed.  Constitutional:      General: He is not in acute distress. HENT:     Head: Normocephalic and atraumatic.  Eyes:     Extraocular Movements: Extraocular movements intact.  Cardiovascular:     Rate and Rhythm: Normal rate and regular rhythm.     Pulses: Normal pulses.  Pulmonary:     Effort: Pulmonary effort is normal. No respiratory distress.     Breath sounds: No stridor. No wheezing, rhonchi or rales.  Abdominal:     General: Bowel sounds are normal.     Palpations: Abdomen is soft.     Tenderness:  There is no abdominal tenderness. There is no guarding.  Musculoskeletal:     Cervical back: Normal range of motion.     Right lower leg: No edema.     Left lower leg: No edema.  Skin:    General: Skin is warm and dry.  Neurological:     General: No focal deficit present.     Mental Status: He is alert and oriented to person, place, and time.     Cranial Nerves: No cranial nerve deficit.     Sensory: No sensory deficit.     Motor: No weakness.     Labs on Admission: I have personally reviewed following labs and imaging studies  CBC: Recent Labs  Lab 08/12/24 1906 08/12/24 1912  WBC 16.5*  --   NEUTROABS 14.5*  --   HGB 9.8* 10.2*  9.9*  HCT 32.1* 30.0*  29.0*  MCV 96.1  --   PLT 342  --    Basic Metabolic Panel: Recent Labs  Lab 08/12/24 1906 08/12/24 1912  NA 137 139  139  K 4.9 4.7  4.8  CL 101 102  CO2 25  --   GLUCOSE 101* 101*  BUN 43* 41*  CREATININE 1.43* 1.70*  CALCIUM  10.6*  --   MG 2.4  --    GFR: Estimated Creatinine Clearance: 35.6 mL/min (A) (by C-G formula based on SCr of 1.7 mg/dL (H)). Liver Function Tests: Recent Labs  Lab 08/12/24 1906  AST 19  ALT 6  ALKPHOS 121  BILITOT 0.4  PROT 7.0  ALBUMIN 2.6*   No results for input(s): LIPASE, AMYLASE in the last 168 hours. No results for input(s): AMMONIA in the last 168 hours. Coagulation Profile: No results for input(s): INR, PROTIME in the last  168 hours. Cardiac Enzymes: No results for input(s): CKTOTAL, CKMB, CKMBINDEX, TROPONINI in the last 168 hours. BNP (last 3 results) Recent Labs    08/12/24 1906  PROBNP 5,355.0*   HbA1C: No results for input(s): HGBA1C in the last 72 hours. CBG: No results for input(s): GLUCAP in the last 168 hours. Lipid Profile: No results for input(s): CHOL, HDL, LDLCALC, TRIG, CHOLHDL, LDLDIRECT in the last 72 hours. Thyroid  Function Tests: No results for input(s): TSH, T4TOTAL, FREET4, T3FREE, THYROIDAB in the last 72 hours. Anemia Panel: No results for input(s): VITAMINB12, FOLATE, FERRITIN, TIBC, IRON, RETICCTPCT in the last 72 hours. Urine analysis:    Component Value Date/Time   COLORURINE YELLOW 06/13/2007 1324   APPEARANCEUR CLEAR 06/13/2007 1324   LABSPEC 1.022 06/13/2007 1324   PHURINE 5.0 06/13/2007 1324   GLUCOSEU NEGATIVE 06/13/2007 1324   HGBUR NEGATIVE 06/13/2007 1324   BILIRUBINUR NEGATIVE 06/13/2007 1324   KETONESUR TRACE (A) 06/13/2007 1324   PROTEINUR NEGATIVE 06/13/2007 1324   UROBILINOGEN 0.2 06/13/2007 1324   NITRITE NEGATIVE 06/13/2007 1324   LEUKOCYTESUR SMALL (A) 06/13/2007 1324    Radiological Exams on Admission: CT ANGIO HEAD NECK W WO CM Result Date: 08/12/2024 EXAM: CTA Head and Neck with and without Intravenous Contrast. CT Head without Contrast. CLINICAL HISTORY: Neuro deficit, acute, stroke suspected. TECHNIQUE: Axial CTA images of the head and neck performed with and without intravenous contrast. MIP reconstructed images were created and reviewed. Axial computed tomography images of the head/brain performed without intravenous contrast. Note: Per PQRS, the description of internal carotid artery percent stenosis, including 0 percent or normal exam, is based on [employer] American Symptomatic Carotid Endarterectomy Trial (NASCET) criteria. Dose reduction technique was used including one or more  of the following:  automated exposure control, adjustment of mA and kV according to patient size, and/or iterative reconstruction. CONTRAST: With and without; 75 mL iohexol  (OMNIPAQUE ) 350 MG/ML injection. COMPARISON: None provided. FINDINGS: CT HEAD: BRAIN: There are hyperdense masses within the brain. The first is at the left frontal lobe and measures 3.3 x 3.1 cm with moderate surrounding vasogenic edema. The second is at the medial right occipital lobe and measures 1.8 x 1.8 cm with moderate surrounding vasogenic edema. A third lesion at the superior right frontal lobe measures 9 mm with mild surrounding vasogenic edema. No CT evidence for acute territorial infarct. No midline shift or extra-axial collection. VENTRICLES: No hydrocephalus. ORBITS: The orbits are unremarkable. SINUSES AND MASTOIDS: The paranasal sinuses and mastoid air cells are clear. CTA NECK: COMMON CAROTID ARTERIES: There is mixed density atherosclerotic disease at both carotid bifurcations. On the right, this results in less than 50% stenosis. On the left, it results in approximately 50% stenosis. No dissection or occlusion. INTERNAL CAROTID ARTERIES: Both internal carotid arteries remain widely patent to the skull base. No stenosis by NASCET criteria. No dissection or occlusion. VERTEBRAL ARTERIES: Moderate stenosis at the origin of the left vertebral artery which is otherwise widely patent with mild multifocal atherosclerotic calcification. No dissection or occlusion. CTA HEAD: ANTERIOR CEREBRAL ARTERIES: No significant stenosis. No occlusion. No aneurysm. MIDDLE CEREBRAL ARTERIES: No significant stenosis. No occlusion. No aneurysm. POSTERIOR CEREBRAL ARTERIES: Fetal origin of the right PCA. No significant stenosis. No occlusion. No aneurysm. BASILAR ARTERY: No significant stenosis. No occlusion. No aneurysm. OTHER: There is mild atherosclerotic calcification of the carotid siphons. SOFT TISSUES: No acute finding. No masses or lymphadenopathy. BONES: No acute  osseous abnormality. LUNGS: Complete collapse of the visible portion of the right lung with a large amount of right-sided pleural effusion incompletely visualized. IMPRESSION: 1. Hyperdense masses within the brain, including a 3.3 x 3.1 cm left frontal lobe lesion, a 1.8 x 1.8 cm medial right occipital lobe lesion, and a 9 mm superior right frontal lobe lesion, with associated vasogenic edema. This is likely metastatic disease and MRI with and without contrast is recommended for further evaluation. 2. Moderate stenosis at the origin of the left vertebral artery, which is otherwise widely patent with mild multifocal atherosclerotic calcification. 3. Mixed density atherosclerotic disease at both carotid bifurcations, resulting in less than 50% stenosis on the right and approximately 50% stenosis on the left, with both internal carotid arteries widely patent to the skull base. 4. Complete collapse of the visible portion of the right lung with a large amount of right-sided pleural effusion, incompletely visualized. Electronically signed by: Franky Stanford MD 08/12/2024 08:43 PM EST RP Workstation: [id number]   CT Angio Chest PE W and/or Wo Contrast Result Date: 08/12/2024 EXAM: CTA CHEST 08/12/2024 08:14:23 PM TECHNIQUE: CTA of the chest was performed without and with intravenous contrast. 75 mL of iohexol  (OMNIPAQUE ) 350 MG/ML injection was administered. Multiplanar reformatted images are provided for review. MIP images are provided for review. Automated exposure control, iterative reconstruction, and/or weight based adjustment of the mA/kV was utilized to reduce the radiation dose to as low as reasonably achievable. COMPARISON: PET CT dated 07/12/2024. CLINICAL HISTORY: Pulmonary embolism (PE) suspected, high prob. FINDINGS: PULMONARY ARTERIES: Pulmonary arteries are adequately opacified for evaluation. No acute pulmonary embolus. Main pulmonary artery is normal in caliber. MEDIASTINUM: Leftward cardiomediastinal  shift. Moderate 3 vessel coronary atherosclerosis. Thoracic aortic atherosclerosis. LYMPH NODES: Small mediastinal and right hilar nodes, including the 13 mm short axis  right paratracheal node, grossly unchanged. LUNGS AND PLEURA: 16.4 cm right upper lobe mass, corresponding to the patient's known primary bronchogenic carcinoma, previously 13.8 cm. No focal consolidation or pulmonary edema. No evidence of pleural effusion or pneumothorax. UPPER ABDOMEN: Status post cholecystectomy. 6.2 cm right adrenal mass (image 151) previously 4.7 cm. SOFT TISSUES AND BONES: Mild erosion of the right lateral 3rd through 5th ribs, reflecting osseous invasion. No acute soft tissue abnormality. IMPRESSION: 1. No pulmonary embolism. 2. Enlarging 16.4 cm right upper lobe mass, reflecting primary bronchogenic carcinoma. 3. Associated small thoracic nodal metastases, grossly unchanged. 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 4. Osseous invasion of the right lateral 3rd-5th ribs, similar to prior. Electronically signed by: Pinkie Pebbles MD 08/12/2024 08:40 PM EST RP Workstation: [id number]   DG Chest Port 1 View Result Date: 08/12/2024 EXAM: 1 VIEW(S) XRAY OF THE CHEST 08/12/2024 07:37:00 PM COMPARISON: PET CT dated 07/12/2024. CLINICAL HISTORY: fatigue fatigue fatigue FINDINGS: LUNGS AND PLEURA: Large right upper lobe mass, compatible with primary bronchogenic carcinoma. Coarse interstitial markings and scarring in the bilateral mid and lower lungs. No pleural effusion. No pneumothorax. HEART AND MEDIASTINUM: No acute abnormality of the cardiac and mediastinal silhouettes. BONES AND SOFT TISSUES: No acute osseous abnormality. IMPRESSION: 1. Large right upper lobe mass, compatible with primary bronchogenic carcinoma. 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  Showing mild mass effect on the left lateral ventricle without midline shift or herniation. -Neurosurgery consulting, appreciate recommendations -Continue IV Decadron  4 mg every 6 hours -Hold anticoagulation and antiplatelet agents -Frequent neurochecks -Will need IR versus pulm biopsy before neurosurgery can decide on treatment plan for brain metastasis  Acute on chronic HFmrEF Echo done in August 2024 showing EF 40 to 45%, grade 1 diastolic dysfunction, RV systolic function moderately reduced, mild mitral regurgitation.  Labs today showing elevated proBNP of 5355.  CT chest showing no pulmonary edema or pleural effusion.  Patient was given IV Lasix  80 mg in the ED.  Repeat echocardiogram ordered.  Monitor intake and output, daily weights.  Acute on chronic hypoxemic respiratory failure In the setting of problems listed above.  Patient desaturated to the mid-high 80s on his home 2 L [place] in the ED and oxygen  saturation improved with 4 L .  Continue supplemental oxygen .  Possible sepsis Presenting with tachycardia, tachypnea, leukocytosis, and mild lactic acidosis.  Not hypotensive.  Lactic acidosis resolved on repeat labs.  No obvious infectious source identified on imaging of chest/abdomen/pelvis.  Continue broad-spectrum antibiotics at this time including vancomycin , cefepime , and metronidazole .  Follow-up UA.  Blood cultures ordered.  Trend WBC count.  Respiratory viral panel ordered.  Elevated troponin History of CAD status post multiple PCIs EKG without acute ischemic changes.  Troponin 33>48, possibly from demand ischemia.  ACS less likely as patient denies chest pain and is resting comfortably.  Continue to trend  troponin.  Holding anticoagulation and antiplatelet agents as above.  Normocytic anemia Hemoglobin 9.8.  It was previously 16.0 in October 2025 and around 12 in August 2024.  No GI bleed symptoms reported.  Anemia panel and FOBT ordered.  Mild hypercalcemia Likely due to malignancy.  Continue  to monitor calcium  level.  Paroxysmal A-fib/flutter Patient had previously declined anticoagulation and best to hold off starting at this time given brain MRI findings as mentioned above.  Blood pressure stable.  Continue metoprolol .  CKD stage IIIa Creatinine stable, monitor labs.  PAD CT showing severe atherosclerotic disease with occlusion of the right common iliac artery.  No anticoagulation or antiplatelet agents at this time as above.  Will need evaluation by vascular surgery.  DVT prophylaxis: SCDs Code Status: DNR/DNI (discussed with the patient's wife) Family Communication: Wife at bedside. Level of care: Progressive Care Unit Admission status: It is my clinical opinion that admission to INPATIENT is reasonable and necessary because of the expectation that this patient will require hospital care that crosses at least 2 midnights to treat this condition based on the medical complexity of the problems presented.  Given the aforementioned information, the predictability of an adverse outcome is felt to be significant.  Editha Ram MD [facility]  If 7PM-7AM, please contact night-coverage [email or web address]  08/12/2024, 10:51 PM       [1]  Allergies Allergen Reactions   Heparin      bleeding   "

## 2024-08-12 NOTE — ED Triage Notes (Signed)
 Patient BIB [facility] from store for AMS and weakness after store staff called. LKW 1630 when he left from home, patient has hx of lung cancer, currently undergoing treatment for same. GCS 14 with EMS, but weak and unable to stand without assistance. BP 180/100 HR 140 99% 2L CBG 252

## 2024-08-13 ENCOUNTER — Other Ambulatory Visit: Payer: Self-pay

## 2024-08-13 ENCOUNTER — Encounter (HOSPITAL_COMMUNITY): Payer: Self-pay | Admitting: Internal Medicine

## 2024-08-13 DIAGNOSIS — C3490 Malignant neoplasm of unspecified part of unspecified bronchus or lung: Secondary | ICD-10-CM

## 2024-08-13 DIAGNOSIS — R7989 Other specified abnormal findings of blood chemistry: Secondary | ICD-10-CM

## 2024-08-13 DIAGNOSIS — A419 Sepsis, unspecified organism: Secondary | ICD-10-CM

## 2024-08-13 LAB — IRON AND TIBC
Iron: 20 ug/dL — ABNORMAL LOW (ref 45–182)
Saturation Ratios: 11 % — ABNORMAL LOW (ref 17.9–39.5)
TIBC: 189 ug/dL — ABNORMAL LOW (ref 250–450)
UIBC: 169 ug/dL

## 2024-08-13 LAB — FOLATE: Folate: 16.9 ng/mL

## 2024-08-13 LAB — BASIC METABOLIC PANEL WITH GFR
Anion gap: 12 (ref 5–15)
BUN: 45 mg/dL — ABNORMAL HIGH (ref 8–23)
CO2: 25 mmol/L (ref 22–32)
Calcium: 10.3 mg/dL (ref 8.9–10.3)
Chloride: 99 mmol/L (ref 98–111)
Creatinine, Ser: 1.56 mg/dL — ABNORMAL HIGH (ref 0.61–1.24)
GFR, Estimated: 47 mL/min — ABNORMAL LOW
Glucose, Bld: 129 mg/dL — ABNORMAL HIGH (ref 70–99)
Potassium: 5.6 mmol/L — ABNORMAL HIGH (ref 3.5–5.1)
Sodium: 136 mmol/L (ref 135–145)

## 2024-08-13 LAB — CBC
HCT: 33.8 % — ABNORMAL LOW (ref 39.0–52.0)
Hemoglobin: 10.2 g/dL — ABNORMAL LOW (ref 13.0–17.0)
MCH: 28.7 pg (ref 26.0–34.0)
MCHC: 30.2 g/dL (ref 30.0–36.0)
MCV: 95.2 fL (ref 80.0–100.0)
Platelets: 360 K/uL (ref 150–400)
RBC: 3.55 MIL/uL — ABNORMAL LOW (ref 4.22–5.81)
RDW: 14.6 % (ref 11.5–15.5)
WBC: 15.4 K/uL — ABNORMAL HIGH (ref 4.0–10.5)
nRBC: 0 % (ref 0.0–0.2)

## 2024-08-13 LAB — VITAMIN B12: Vitamin B-12: 1174 pg/mL — ABNORMAL HIGH (ref 180–914)

## 2024-08-13 LAB — RETICULOCYTES
Immature Retic Fract: 17.1 % — ABNORMAL HIGH (ref 2.3–15.9)
RBC.: 3.44 MIL/uL — ABNORMAL LOW (ref 4.22–5.81)
Retic Count, Absolute: 65.4 K/uL (ref 19.0–186.0)
Retic Ct Pct: 1.9 % (ref 0.4–3.1)

## 2024-08-13 LAB — TROPONIN T, HIGH SENSITIVITY: Troponin T High Sensitivity: 46 ng/L — ABNORMAL HIGH (ref 0–19)

## 2024-08-13 LAB — FERRITIN: Ferritin: 1307 ng/mL — ABNORMAL HIGH (ref 24–336)

## 2024-08-13 MED ORDER — SODIUM ZIRCONIUM CYCLOSILICATE 10 G PO PACK: 10.0000 g | PACK | Freq: Two times a day (BID) | ORAL | Status: DC

## 2024-08-13 MED ORDER — SODIUM CHLORIDE 0.9 % IV SOLN: 2.0000 g | Freq: Two times a day (BID) | INTRAVENOUS | Status: DC

## 2024-08-13 MED ORDER — METOPROLOL SUCCINATE ER 25 MG PO TB24: 25.0000 mg | ORAL_TABLET | Freq: Every day | ORAL | Status: DC

## 2024-08-13 MED ORDER — ACETAMINOPHEN 325 MG PO TABS
650.0000 mg | ORAL_TABLET | Freq: Four times a day (QID) | ORAL | Status: DC | PRN
  Administered 2024-08-17 – 2024-08-23 (×4): 650 mg via ORAL
  Filled 2024-08-13 (×5): qty 2

## 2024-08-13 MED ORDER — SODIUM ZIRCONIUM CYCLOSILICATE 10 G PO PACK
10.0000 g | PACK | Freq: Two times a day (BID) | ORAL | Status: AC
  Administered 2024-08-13 (×2): 10 g via ORAL
  Filled 2024-08-13 (×2): qty 1

## 2024-08-13 MED ORDER — ACETAMINOPHEN 650 MG RE SUPP: 650.0000 mg | Freq: Four times a day (QID) | RECTAL | Status: DC | PRN

## 2024-08-13 MED ORDER — VANCOMYCIN HCL IN DEXTROSE 1-5 GM/200ML-% IV SOLN: 1000.0000 mg | INTRAVENOUS | Status: DC

## 2024-08-13 MED ORDER — METRONIDAZOLE 500 MG/100ML IV SOLN
500.0000 mg | Freq: Two times a day (BID) | INTRAVENOUS | Status: DC
  Administered 2024-08-13: 500 mg via INTRAVENOUS
  Filled 2024-08-13: qty 100

## 2024-08-13 NOTE — Progress Notes (Signed)
" ° °  Brief Progress Note   _____________________________________________________________________________________________________________  Patient Name: Jeffrey Norton Patient DOB: 23-Dec-1950 Date: @TODAY @      Data: Reviewed labs, notes, VS.     Action: No action needed at this time.      Response:    _____________________________________________________________________________________________________________  [facility] RN Expeditor Sharolyn JONETTA Batman Please contact us  directly via secure chat (search for [facility]) or by calling us  at [phone] [facility]).  "

## 2024-08-13 NOTE — ED Notes (Signed)
 RN notified Dr. Editha that pt cardiac monitor is showing bradycardia 47-60's. All other vital signs are stable. Pt is resting at this time.

## 2024-08-13 NOTE — Progress Notes (Signed)
 [facility] PROGRESS NOTE    Progress Note  Jeffrey Norton  FMW:[id number] DOB: 1951/03/09 DOA: 08/12/2024 PCP: Dayna Motto, DO     Brief Narrative:   Jeffrey Norton is an 74 y.o. 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  White blood cell count of 16 hemoglobin of 9.  CT of the head showed multiple brain mass in the left frontal right occipital and right frontal lobe with vasogenic edema.  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 Started on IV Decadron  Q6. Anticoagulation and antiplatelet therapy were held. Neurosurgery has been consulted.  Unlikely a candidate for surgery with multiple metastases in the brain chest and abdomen. Will consult pulmonary for bronchoscopy and biopsy.  Chronic HFrEF: Last 2D echo 2024 showed an EF of 40%. CT angio of the chest showed no pulmonary edema or pleural effusion.  proBNP is elevated. He has had good urine output does not appear fluid overload on physical exam urine is clear. He is not in acute decompensated heart failure. Stop IV Lasix  give him IV fluids.  Acute on chronic hypoxemic respiratory failure: Continue oxygen  saturations. Discontinue IV empiric  antibiotics.  He has remained afebrile. Leukocytosis likely due to malignancy it will probably get worse with IV steroids.  Hyperkalemia: Go ahead and give him Lokelma  once he is able to tolerate orals. Will start him on IV fluids recheck a basic metabolic panel in the morning.  Elevated troponin/history of CAD: Likely due to demand ischemia he denies any chest pain or shortness of breath. Hold anticoagulation and antiplatelet therapy for possible biopsy  Normocytic anemia: No evidence of GI bleed hemoglobin on admission was 9 late last year with 16. After IV Lasix  is increasing concern about hemoconcentration.  Hold IV Lasix . Continue to monitor.  Mild hypercalcemia: Likely due to malignancy. Start IV fluids.  Check a BMP tomorrow morning.  Paroxysmal atrial fibrillation: He declined anticoagulation in the past.  Chronic kidney disease stage IIIa: His creatinine appears to be at baseline.  Peripheral artery disease: Noted asymptomatic.  Goals of care: Consult palliative care, as very poor prognosis.   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   In Pre-Arrest Conditions (Patient Is Breathing and Has A Pulse) Do not intubate. Provide all appropriate non-invasive medical interventions. Avoid ICU transfer unless indicated or required.  Consent: Discussion documented in EHR or advanced directives reviewed      08/13/24 0025           Code Status History     Date Active Date Inactive Code Status Order ID Comments User Context   08/13/2024 0014 08/13/2024 0025 Limited: Do  not attempt resuscitation (DNR) -DNR-LIMITED -Do Not Intubate/DNI  [id number]  Alfornia Madison, MD ED   03/04/2023 0530 03/06/2023 1836 Full Code [id number]  Laveda [place], MD ED   02/22/2023 1110 02/24/2023 0257 Full Code [id number]  Barrett, Shona MATSU, PA-C ED   07/07/2022 1907 07/08/2022 2127 Full Code [id number]  Jadine Aline BRAVO, PA-C Inpatient   07/07/2022 1619 07/07/2022 1907 Full Code [id number]  Jordan, Peter M, MD Inpatient         IV Access:   Peripheral IV   Procedures and diagnostic studies:   MR Brain W and Wo Contrast Result Date: 08/12/2024 EXAM: MRI BRAIN WITH AND WITHOUT CONTRAST 08/12/2024 11:12:35 PM TECHNIQUE: Multiplanar multisequence MRI of the head/brain was performed with and without the administration of intravenous contrast. 7 mL of gadobutrol  (GADAVIST ) 1 MMOL/ML injection was administered. COMPARISON: None available. CLINICAL HISTORY: Metastatic disease evaluation. FINDINGS: BRAIN AND VENTRICLES: No acute infarct. There are 3 contrast-enhancing metastatic lesions within the brain: 1. Left frontal lobe 3.2 x 3.0 cm with surrounding moderate vasogenic edema, heterogeneous internal signal with hemorrhagic changes. 2. Parasagittal right occipital lobe 1.7 x 1.4 cm with mild surrounding vasogenic edema and internal hemorrhagic change. 3. Superior right frontal lobe 7 mm with mild surrounding edema and evidence of prior hemorrhage. There is mild mass effect on the left lateral ventricle but no midline shift or herniation. No hydrocephalus. The sella is unremarkable. Normal flow voids. ORBITS: No significant abnormality. SINUSES: No significant abnormality. BONES AND SOFT TISSUES: Normal bone marrow signal. No soft tissue abnormality. IMPRESSION: 1. Three contrast-enhancing intracranial metastatic lesions with associated hemorrhagic change and vasogenic edema, largest in the left frontal lobe (3.2 x 3.0 cm), with additional lesions in the parasagittal right occipital lobe (1.7 x  1.4 cm) and superior right frontal lobe (7 mm). 2. Mild mass effect on the left lateral ventricle without midline shift or herniation. Electronically signed by: Franky Stanford MD 08/12/2024 11:26 PM EST RP Workstation: [id number]   CT ABDOMEN PELVIS W CONTRAST Result Date: 08/12/2024 EXAM: CT ABDOMEN AND PELVIS WITH CONTRAST 08/12/2024 10:42:24 PM TECHNIQUE: CT of the abdomen and pelvis was performed with the administration of 75 mL of iohexol  (OMNIPAQUE ) 350 MG/ML injection. Multiplanar reformatted images are provided for review. Automated exposure control, iterative reconstruction, and/or weight-based adjustment of the mA/kV was utilized to reduce the radiation dose to as low as reasonably achievable. COMPARISON: CT abdomen and pelvis 10/24/2006. CLINICAL HISTORY: Metastatic disease evaluation. FINDINGS: LOWER CHEST: There are increasing fibrotic changes in the lung bases. LIVER: The liver is unremarkable. GALLBLADDER AND BILE DUCTS: The gallbladder is surgically absent. No biliary ductal dilatation. SPLEEN: No acute abnormality. PANCREAS: No acute abnormality. ADRENAL GLANDS: There is an enlarging 6.1 x 4.9 cm right adrenal mass arg . Left adrenal gland within normal limits. KIDNEYS, URETERS AND BLADDER: There is a punctate calculus in the inferior pole of the right kidney. There are bilateral renal cysts measuring up to 2.8 cm on the right and 3.5 cm on the left. Per consensus, no follow-up is needed for simple Bosniak type 1 and 2 renal cysts, unless the patient has a malignancy history or risk factors. No hydronephrosis. No perinephric or periureteral stranding. Urinary  bladder is unremarkable. GI AND BOWEL: Stomach demonstrates no acute abnormality. Sigmoid and descending colon diverticulosis is present. The appendix appears normal. There is no bowel obstruction. PERITONEUM AND RETROPERITONEUM: No ascites. No free air. VASCULATURE: Aorta is normal in caliber. Severe atherosclerotic disease throughout the  aorta. 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 LYMPH NODES: No lymphadenopathy. REPRODUCTIVE ORGANS: There are calcifications in the prostate gland. BONES AND SOFT TISSUES: No acute osseous abnormality. No focal soft tissue abnormality. IMPRESSION: 1. No acute findings. 2. Enlarging 6.1 x 4.9 cm right adrenal mass. 3. 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 Electronically signed by: Greig Pique MD 08/12/2024 11:05 PM EST RP Workstation: [id number]   CT ANGIO HEAD NECK W WO CM Result Date: 08/12/2024 EXAM: CTA Head and Neck with and without Intravenous Contrast. CT Head without Contrast. CLINICAL HISTORY: Neuro deficit, acute, stroke suspected. TECHNIQUE: Axial CTA images of the head and neck performed with and without intravenous contrast. MIP reconstructed images were created and reviewed. Axial computed tomography images of the head/brain performed without intravenous contrast. Note: Per PQRS, the description of internal carotid artery percent stenosis, including 0 percent or normal exam, is based on [employer] American Symptomatic Carotid Endarterectomy Trial (NASCET) criteria. Dose reduction technique was used including one or more of the following: automated exposure control, adjustment of mA and kV according to patient size, and/or iterative reconstruction. CONTRAST: With and without; 75 mL iohexol  (OMNIPAQUE ) 350 MG/ML injection. COMPARISON: None provided. FINDINGS: CT HEAD: BRAIN: There are hyperdense masses within the brain. The first is at the left frontal lobe and measures 3.3 x 3.1 cm with moderate surrounding vasogenic edema. The second is at the medial right occipital lobe and measures 1.8 x 1.8 cm with moderate surrounding vasogenic edema. A third lesion at the superior right frontal lobe measures 9 mm with mild surrounding vasogenic edema. No CT evidence for acute territorial infarct. No midline shift or extra-axial collection. VENTRICLES: No  hydrocephalus. ORBITS: The orbits are unremarkable. SINUSES AND MASTOIDS: The paranasal sinuses and mastoid air cells are clear. CTA NECK: COMMON CAROTID ARTERIES: There is mixed density atherosclerotic disease at both carotid bifurcations. On the right, this results in less than 50% stenosis. On the left, it results in approximately 50% stenosis. No dissection or occlusion. INTERNAL CAROTID ARTERIES: Both internal carotid arteries remain widely patent to the skull base. No stenosis by NASCET criteria. No dissection or occlusion. VERTEBRAL ARTERIES: Moderate stenosis at the origin of the left vertebral artery which is otherwise widely patent with mild multifocal atherosclerotic calcification. No dissection or occlusion. CTA HEAD: ANTERIOR CEREBRAL ARTERIES: No significant stenosis. No occlusion. No aneurysm. MIDDLE CEREBRAL ARTERIES: No significant stenosis. No occlusion. No aneurysm. POSTERIOR CEREBRAL ARTERIES: Fetal origin of the right PCA. No significant stenosis. No occlusion. No aneurysm. BASILAR ARTERY: No significant stenosis. No occlusion. No aneurysm. OTHER: There is mild atherosclerotic calcification of the carotid siphons. SOFT TISSUES: No acute finding. No masses or lymphadenopathy. BONES: No acute osseous abnormality. LUNGS: Complete collapse of the visible portion of the right lung with a large amount of right-sided pleural effusion incompletely visualized. IMPRESSION: 1. Hyperdense masses within the brain, including a 3.3 x 3.1 cm left frontal lobe lesion, a 1.8 x 1.8 cm medial right occipital lobe lesion, and a 9 mm superior right frontal lobe lesion, with associated vasogenic edema. This is likely metastatic disease and MRI with and without contrast is recommended for further evaluation. 2. Moderate stenosis at the origin of  the left vertebral artery, which is otherwise widely patent with mild multifocal atherosclerotic calcification. 3. Mixed density atherosclerotic disease at both carotid  bifurcations, resulting in less than 50% stenosis on the right and approximately 50% stenosis on the left, with both internal carotid arteries widely patent to the skull base. 4. Complete collapse of the visible portion of the right lung with a large amount of right-sided pleural effusion, incompletely visualized. Electronically signed by: Franky Stanford MD 08/12/2024 08:43 PM EST RP Workstation: [id number]   CT Angio Chest PE W and/or Wo Contrast Result Date: 08/12/2024 EXAM: CTA CHEST 08/12/2024 08:14:23 PM TECHNIQUE: CTA of the chest was performed without and with intravenous contrast. 75 mL of iohexol  (OMNIPAQUE ) 350 MG/ML injection was administered. Multiplanar reformatted images are provided for review. MIP images are provided for review. Automated exposure control, iterative reconstruction, and/or weight based adjustment of the mA/kV was utilized to reduce the radiation dose to as low as reasonably achievable. COMPARISON: PET CT dated 07/12/2024. CLINICAL HISTORY: Pulmonary embolism (PE) suspected, high prob. FINDINGS: PULMONARY ARTERIES: Pulmonary arteries are adequately opacified for evaluation. No acute pulmonary embolus. Main pulmonary artery is normal in caliber. MEDIASTINUM: Leftward cardiomediastinal shift. Moderate 3 vessel coronary atherosclerosis. Thoracic aortic atherosclerosis. LYMPH NODES: Small mediastinal and right hilar nodes, including the 13 mm short axis right paratracheal node, grossly unchanged. LUNGS AND PLEURA: 16.4 cm right upper lobe mass, corresponding to the patient's known primary bronchogenic carcinoma, previously 13.8 cm. No focal consolidation or pulmonary edema. No evidence of pleural effusion or pneumothorax. UPPER ABDOMEN: Status post cholecystectomy. 6.2 cm right adrenal mass (image 151) previously 4.7 cm. SOFT TISSUES AND BONES: Mild erosion of the right lateral 3rd through 5th ribs, reflecting osseous invasion. No acute soft tissue abnormality. IMPRESSION: 1. No pulmonary  embolism. 2. Enlarging 16.4 cm right upper lobe mass, reflecting primary bronchogenic carcinoma. 3. Associated small thoracic nodal metastases, grossly unchanged. 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 4. Osseous invasion of the right lateral 3rd-5th ribs, similar to prior. Electronically signed by: Pinkie Pebbles MD 08/12/2024 08:40 PM EST RP Workstation: [id number]   DG Chest Port 1 View Result Date: 08/12/2024 EXAM: 1 VIEW(S) XRAY OF THE CHEST 08/12/2024 07:37:00 PM COMPARISON: PET CT dated 07/12/2024. CLINICAL HISTORY: fatigue fatigue fatigue FINDINGS: LUNGS AND PLEURA: Large right upper lobe mass, compatible with primary bronchogenic carcinoma. Coarse interstitial markings and scarring in the bilateral mid and lower lungs. No pleural effusion. No pneumothorax. HEART AND MEDIASTINUM: No acute abnormality of the cardiac and mediastinal silhouettes. BONES AND SOFT TISSUES: No acute osseous abnormality. IMPRESSION: 1. Large right upper lobe mass, compatible with primary bronchogenic carcinoma. Electronically signed by: Pinkie Pebbles MD 08/12/2024 07:45 PM EST RP Workstation: [id number]     Medical Consultants:   None.   Subjective:    Jeffrey Norton unresponsive this morning  Objective:    Vitals:   08/13/24 0231 08/13/24 0400 08/13/24 0445 08/13/24 0500  BP:  122/63 (!) 118/58 (!) 114/55  Pulse:  74 69 68  Resp:  18 19 17   Temp: (!) 97.4 F (36.3 C)     TempSrc: Oral     SpO2:  100% 100% 100%   SpO2: 100 %   Intake/Output Summary (Last 24 hours) at 08/13/2024 9390 Last data filed at 08/13/2024 0444 Gross per 24 hour  Intake 450 ml  Output --  Net 450 ml   There were no vitals filed for this visit.  Exam: General exam: In no acute distress. Respiratory system: Good air movement  and clear to auscultation. Cardiovascular system: S1 & S2 heard, RRR. No JVD. Gastrointestinal system: Abdomen is nondistended, soft and nontender.  Extremities: No pedal  edema. Skin: No rashes, lesions or ulcers Psychiatry: No judgment or insight of medical condition.   Data Reviewed:    Labs: Basic Metabolic Panel: Recent Labs  Lab 08/12/24 1906 08/12/24 1912 08/13/24 0154  NA 137 139  139 136  K 4.9 4.7  4.8 5.6*  CL 101 102 99  CO2 25  --  25  GLUCOSE 101* 101* 129*  BUN 43* 41* 45*  CREATININE 1.43* 1.70* 1.56*  CALCIUM  10.6*  --  10.3  MG 2.4  --   --    GFR Estimated Creatinine Clearance: 38.8 mL/min (A) (by C-G formula based on SCr of 1.56 mg/dL (H)). Liver Function Tests: Recent Labs  Lab 08/12/24 1906  AST 19  ALT 6  ALKPHOS 121  BILITOT 0.4  PROT 7.0  ALBUMIN 2.6*   No results for input(s): LIPASE, AMYLASE in the last 168 hours. No results for input(s): AMMONIA in the last 168 hours. Coagulation profile No results for input(s): INR, PROTIME in the last 168 hours. COVID-19 Labs  Recent Labs    08/13/24 0154  FERRITIN 1,307*    No results found for: SARSCOV2NAA  CBC: Recent Labs  Lab 08/12/24 1906 08/12/24 1912 08/13/24 0154  WBC 16.5*  --  15.4*  NEUTROABS 14.5*  --   --   HGB 9.8* 10.2*  9.9* 10.2*  HCT 32.1* 30.0*  29.0* 33.8*  MCV 96.1  --  95.2  PLT 342  --  360   Cardiac Enzymes: No results for input(s): CKTOTAL, CKMB, CKMBINDEX, TROPONINI in the last 168 hours. BNP (last 3 results) Recent Labs    08/12/24 1906  PROBNP 5,355.0*   CBG: No results for input(s): GLUCAP in the last 168 hours. D-Dimer: No results for input(s): DDIMER in the last 72 hours. Hgb A1c: No results for input(s): HGBA1C in the last 72 hours. Lipid Profile: No results for input(s): CHOL, HDL, LDLCALC, TRIG, CHOLHDL, LDLDIRECT in the last 72 hours. Thyroid  function studies: No results for input(s): TSH, T4TOTAL, T3FREE, THYROIDAB in the last 72 hours.  Invalid input(s): FREET3 Anemia work up: Recent Labs    08/13/24 0154  VITAMINB12 1,174*  FOLATE 16.9   FERRITIN 1,307*  TIBC 189*  IRON 20*  RETICCTPCT 1.9   Sepsis Labs: Recent Labs  Lab 08/12/24 1906 08/12/24 1913 08/12/24 2053 08/13/24 0154  WBC 16.5*  --   --  15.4*  LATICACIDVEN  --  2.1* 0.8  --    Microbiology Recent Results (from the past 240 hours)  Culture, blood (Routine X 2) w Reflex to ID Panel     Status: None (Preliminary result)   Collection Time: 08/13/24  1:30 AM   Specimen: BLOOD LEFT ARM  Result Value Ref Range Status   Specimen Description BLOOD LEFT ARM  Final   Special Requests   Final    BOTTLES DRAWN AEROBIC AND ANAEROBIC Blood Culture adequate volume PATIENT ON FOLLOWING CEFEPIME  2 GRAMS,VANCOMYCIN  1250 MG Performed at [facility], [address] [place], [place] [address]    Culture PENDING  Incomplete   Report Status PENDING  Incomplete  Culture, blood (Routine X 2) w Reflex to ID Panel     Status: None (Preliminary result)   Collection Time: 08/13/24  1:49 AM   Specimen: BLOOD RIGHT HAND  Result Value Ref Range Status  Specimen Description BLOOD RIGHT HAND  Final   Special Requests   Final    BOTTLES DRAWN AEROBIC ONLY Blood Culture adequate volume PATIENT ON FOLLOWING CEFEPIME  2 GRAMS,VANCOMYCIN  1250 MG Performed at [facility], [address] [place], [place] [address]    Culture PENDING  Incomplete   Report Status PENDING  Incomplete     Medications:    dexamethasone  (DECADRON ) injection  4 mg Intravenous Q6H   Continuous Infusions:  ceFEPime  (MAXIPIME ) IV     metronidazole  Stopped (08/13/24 0444)   vancomycin         LOS: 1 day   Jeffrey Norton  [facility]  08/13/2024, 6:09 AM

## 2024-08-13 NOTE — Consult Note (Signed)
 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 Of note, he had held his aspirin  and plavix  5 days prior to this ED visit in anticipation of an outpatient IR biopsy tomorrow Formal recommendations on brain masses to follow histology Decadron  4q6 Activity and diet as tolerated From my standpoint ok for DVT chemoppx. Otherwise hold all anticoag and antiplatelets   CC: confusion  HPI:     Patient is a 74 y.o. male w/ hx multiple comorbidities including NSTEMI on aspirin  Plavix , known lung mass not yet formally diagnosed who presented with confusion versus aphasia for 1 day.  CT and subsequent MRI show multifocal intracranial metastatic disease with the largest lesion being in the left frontal lobe 3 cm.  Chest abdomen pelvis shows large right upper lobe mass, thoracic lymph node metastases, osseous rib metastatic disease, enlarging adrenal mass, severe atherosclerotic disease. The lung mass was discovered in October. A PET scan was completed shortly thereafter. However, according to family, a pulmonary biopsy was ordered but they were never contacted until January which prompted a pulmonary biopsy which is scheduled tomorrow   Patient Active Problem List   Diagnosis Date Noted   Metastatic primary lung cancer (HCC) 08/13/2024   Sepsis (HCC) 08/13/2024   Elevated troponin 08/13/2024   Metastasis to brain (HCC) 08/12/2024   AKI (acute kidney injury) 03/05/2023   CHF exacerbation (HCC) 03/04/2023   Acute on chronic systolic heart failure (HCC) 03/04/2023   CKD (chronic kidney disease) stage 3, GFR 30-59 ml/min (HCC) 03/04/2023   Unstable angina (HCC) 02/22/2023   ACS (acute coronary syndrome) (HCC) 02/22/2023    Atrial flutter (HCC) 07/08/2022   HFrEF (heart failure with reduced ejection fraction) (HCC) 07/08/2022   Atrial fibrillation (HCC) 07/07/2022   Non-ST elevation (NSTEMI) myocardial infarction (HCC) 07/07/2022   NSTEMI (non-ST elevated myocardial infarction) (HCC) 07/07/2022   Hyperlipidemia 04/25/2013   Claudication of lower extremity 08/15/2012   HTN (hypertension) 08/15/2012   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 Myoview low risk 12/13 08/15/2012   Renal artery stenosis:  40% right renal artery 08/15/2012   PVD, LCIA, LSFA PTA 08/15/12, residual Rt disease but minimal symptoms 08/15/2012   Past Medical History:  Diagnosis Date   Coronary artery disease    Hyperlipidemia    Hypertension    Myocardial infarction [facility])    PVD (peripheral vascular disease)    S/P angioplasty with stent to Lt. iliac 08/15/12 08/15/2012    Past Surgical History:  Procedure Laterality Date   2D Echocardiogram  08/25/2004   EF >55%, LA-moderately dilated.   BIOPSY  03/05/2023   Procedure: BIOPSY;  Surgeon: Rollin Dover, MD;  Location: [facility] ENDOSCOPY;  Service: Gastroenterology;;   CARDIAC CATHETERIZATION  03/11/2002   RCA subtotally occluded with TIMI-1 to1/2 flow stented with a 3x23 Cypher DES resulting in reduction of occlusion to 0% with TIMI-3 flow   CORONARY BALLOON ANGIOPLASTY N/A 02/22/2023   Procedure: CORONARY BALLOON ANGIOPLASTY;  Surgeon: Dann Candyce RAMAN, MD;  Location: [facility] INVASIVE CV LAB;  Service: Cardiovascular;  Laterality: N/A;   CORONARY THROMBECTOMY N/A 02/22/2023   Procedure: Coronary Thrombectomy;  Surgeon: Dann Candyce RAMAN, MD;  Location: [facility] INVASIVE CV LAB;  Service: Cardiovascular;  Laterality: N/A;   ESOPHAGOGASTRODUODENOSCOPY (EGD)  WITH PROPOFOL  N/A 03/05/2023   Procedure: ESOPHAGOGASTRODUODENOSCOPY (EGD) WITH PROPOFOL ;  Surgeon: Rollin Dover, MD;  Location: [facility] ENDOSCOPY;  Service: Gastroenterology;  Laterality: N/A;   LEA Doppler  08/29/2012    L SFA Proximal: 70-99% diameter reduction. Velocities suggest upper end of range-new finding when compared to previous study. L EIA stent: opne and patent without evidence of restenosis   LEFT HEART CATH AND CORONARY ANGIOGRAPHY N/A 07/07/2022   Procedure: LEFT HEART CATH AND CORONARY ANGIOGRAPHY;  Surgeon: Jordan, Peter M, MD;  Location: [facility] INVASIVE CV LAB;  Service: Cardiovascular;  Laterality: N/A;   LEFT HEART CATH AND CORONARY ANGIOGRAPHY N/A 02/22/2023   Procedure: LEFT HEART CATH AND CORONARY ANGIOGRAPHY;  Surgeon: Dann Candyce RAMAN, MD;  Location: [facility] INVASIVE CV LAB;  Service: Cardiovascular;  Laterality: N/A;   Lexiscan  Myoview  07/19/2012   No significant ECG changes, mild inferobasal hypokinesis with normal LV function, low risk stress nuclear study   PV Intervention  08/15/2012   Occluded L External Iliac-stented with a 10x51mm Abbott Nitinol Absolute Pro self-expanding stent. 75% stenosis in L Common Femoral artery stented witha 8x61mm lon Absolute Nitinol Absolute Po self-expanding stent.    (Not in a hospital admission)  Allergies[1]  Social History   Tobacco Use   Smoking status: Former    Current packs/day: 0.00    Types: Cigarettes    Quit date: 08/16/1998    Years since quitting: 26.0   Smokeless tobacco: Never  Substance Use Topics   Alcohol use: No    Family History  Problem Relation Age of Onset   Diabetes Mother    Heart disease Mother 66   Hyperlipidemia Mother    Kidney disease Mother    Heart disease Father 36   Cancer Father 82   Stroke Maternal Grandfather        Mid 23s   Lung disease Paternal Grandfather        Black Lung Disease     Review of Systems Pertinent items are noted in HPI.  Objective:   Patient Vitals for the past 8 hrs:  BP Temp Temp src Pulse Resp SpO2  08/13/24 0835 -- (!) 96.3 F (35.7 C) Axillary -- -- --  08/13/24 0745 (!) 103/55 -- -- (!) 42 18 100 %  08/13/24 0730 (!) 114/52 -- -- (!) 37 17 100 %  08/13/24 0715 (!)  114/58 -- -- 69 19 100 %  08/13/24 0500 (!) 114/55 -- -- 68 17 100 %  08/13/24 0445 (!) 118/58 -- -- 69 19 100 %  08/13/24 0400 122/63 -- -- 74 18 100 %  08/13/24 0231 -- (!) 97.4 F (36.3 C) Oral -- -- --  08/13/24 0130 132/77 -- -- 82 (!) 22 100 %  08/13/24 0100 115/73 -- -- 79 (!) 23 98 %  08/13/24 0040 135/66 -- -- 81 (!) 23 98 %   I/O last 3 completed shifts: In: 450 [IV Piggyback:450] Out: -  No intake/output data recorded.    Exam: GCS 4E 4V 65M Aox1 No dysarthria Names 3/3 objects correctly. Repetition intact.  PERRL EOMI, conjugate gaze FS TM Full strength BUE and BLE  Full sensation     Data ReviewCBC:  Lab Results  Component Value Date   WBC 15.4 (H) 08/13/2024   RBC 3.44 (L) 08/13/2024   RBC 3.55 (L) 08/13/2024         [1]  Allergies Allergen Reactions   Heparin      bleeding

## 2024-08-13 NOTE — Progress Notes (Signed)
 Pharmacy Antibiotic Note  BOOKER BHATNAGAR is a 74 y.o. male admitted on 08/12/2024 with concern for sepsis.  Pharmacy has been consulted for vancomycin  and cefepime  dosing.  Plan: Vancomycin  1250mg  given in ED; continue 1000mg  IV Q24H. Goal AUC 400-550.  Expected AUC 540. Cefepime  2g IV Q12H.   Recent Labs  Lab 08/12/24 1906 08/12/24 1912 08/12/24 1913 08/12/24 2053  WBC 16.5*  --   --   --   CREATININE 1.43* 1.70*  --   --   LATICACIDVEN  --   --  2.1* 0.8    Estimated Creatinine Clearance: 35.6 mL/min (A) (by C-G formula based on SCr of 1.7 mg/dL (H)).    Allergies[1]  Thank you for allowing pharmacy to be a part of this patients care.  Marvetta Dauphin, PharmD, BCPS  08/13/2024 12:34 AM     [1]  Allergies Allergen Reactions   Heparin      bleeding

## 2024-08-13 NOTE — ED Notes (Signed)
 EKG snapped

## 2024-08-13 NOTE — ED Notes (Signed)
 Pt metoprolol  has been d/c

## 2024-08-14 ENCOUNTER — Ambulatory Visit (HOSPITAL_COMMUNITY)
Admission: RE | Admit: 2024-08-14 | Discharge: 2024-08-14 | Disposition: A | Discharge status: Home / Self Care | Source: Ambulatory Visit

## 2024-08-14 ENCOUNTER — Inpatient Hospital Stay (HOSPITAL_COMMUNITY)

## 2024-08-14 DIAGNOSIS — R590 Localized enlarged lymph nodes: Secondary | ICD-10-CM

## 2024-08-14 DIAGNOSIS — G936 Cerebral edema: Secondary | ICD-10-CM

## 2024-08-14 DIAGNOSIS — J9611 Chronic respiratory failure with hypoxia: Secondary | ICD-10-CM

## 2024-08-14 DIAGNOSIS — R918 Other nonspecific abnormal finding of lung field: Secondary | ICD-10-CM | POA: Diagnosis not present

## 2024-08-14 DIAGNOSIS — Z01818 Encounter for other preprocedural examination: Secondary | ICD-10-CM

## 2024-08-14 DIAGNOSIS — G9389 Other specified disorders of brain: Secondary | ICD-10-CM | POA: Diagnosis not present

## 2024-08-14 DIAGNOSIS — E279 Disorder of adrenal gland, unspecified: Secondary | ICD-10-CM

## 2024-08-14 DIAGNOSIS — Z87891 Personal history of nicotine dependence: Secondary | ICD-10-CM

## 2024-08-14 LAB — CBC
HCT: 32.4 % — ABNORMAL LOW (ref 39.0–52.0)
Hemoglobin: 10 g/dL — ABNORMAL LOW (ref 13.0–17.0)
MCH: 29.2 pg (ref 26.0–34.0)
MCHC: 30.9 g/dL (ref 30.0–36.0)
MCV: 94.7 fL (ref 80.0–100.0)
Platelets: 337 K/uL (ref 150–400)
RBC: 3.42 MIL/uL — ABNORMAL LOW (ref 4.22–5.81)
RDW: 14.7 % (ref 11.5–15.5)
WBC: 23.9 K/uL — ABNORMAL HIGH (ref 4.0–10.5)
nRBC: 0 % (ref 0.0–0.2)

## 2024-08-14 LAB — BASIC METABOLIC PANEL WITH GFR
Anion gap: 14 (ref 5–15)
BUN: 72 mg/dL — ABNORMAL HIGH (ref 8–23)
CO2: 24 mmol/L (ref 22–32)
Calcium: 10.1 mg/dL (ref 8.9–10.3)
Chloride: 96 mmol/L — ABNORMAL LOW (ref 98–111)
Creatinine, Ser: 2.16 mg/dL — ABNORMAL HIGH (ref 0.61–1.24)
GFR, Estimated: 32 mL/min — ABNORMAL LOW
Glucose, Bld: 133 mg/dL — ABNORMAL HIGH (ref 70–99)
Potassium: 5.2 mmol/L — ABNORMAL HIGH (ref 3.5–5.1)
Sodium: 135 mmol/L (ref 135–145)

## 2024-08-14 LAB — RESP PANEL BY RT-PCR (RSV, FLU A&B, COVID)  RVPGX2
Influenza A by PCR: NEGATIVE
Influenza B by PCR: NEGATIVE
Resp Syncytial Virus by PCR: NEGATIVE
SARS Coronavirus 2 by RT PCR: NEGATIVE

## 2024-08-14 MED ORDER — BUDESON-GLYCOPYRROL-FORMOTEROL 160-9-4.8 MCG/ACT IN AERO
2.0000 | INHALATION_SPRAY | Freq: Two times a day (BID) | RESPIRATORY_TRACT | Status: DC
  Administered 2024-08-15 – 2024-08-25 (×16): 2 via RESPIRATORY_TRACT
  Filled 2024-08-14 (×2): qty 5.9

## 2024-08-14 MED ORDER — DEXAMETHASONE 4 MG PO TABS
4.0000 mg | ORAL_TABLET | Freq: Three times a day (TID) | ORAL | Status: DC
  Administered 2024-08-14 – 2024-08-22 (×24): 4 mg via ORAL
  Filled 2024-08-14 (×25): qty 1

## 2024-08-14 MED ORDER — SODIUM CHLORIDE 0.9 % IV SOLN: INTRAVENOUS | Status: AC

## 2024-08-14 MED ORDER — SODIUM ZIRCONIUM CYCLOSILICATE 10 G PO PACK
10.0000 g | PACK | Freq: Two times a day (BID) | ORAL | Status: AC
  Administered 2024-08-14 (×2): 10 g via ORAL
  Filled 2024-08-14 (×2): qty 1

## 2024-08-14 NOTE — TOC Initial Note (Signed)
 Transition of Care [facility]) - Initial/Assessment Note    Patient Details  Name: Jeffrey Norton [name] Date of Birth: [birth year]  [facility]) CM/SW Contact:    Nola Devere Hands, RN Phone Number: 08/14/2024, 2:54 PM  Clinical Narrative:                 Patient is a 74 yr old male, recently in ED, left AMA. He was scheduled for oupt biopsy today 1/19, however was admitted on 08/12/2024 with increasing severe confusion. Workup revealed brain mass. Now with planned biopsy tomorrow 1/20. ICM Team will continue to follow. May God Help him.     Barriers to Discharge: Continued Medical Work up   Patient Goals and CMS Choice            Expected Discharge Plan and Services       Living arrangements for the past 2 months: Single Family Home                                      Prior Living Arrangements/Services Living arrangements for the past 2 months: Single Family Home Lives with:: Spouse                   Activities of Daily Living   ADL Screening (condition at time of admission) Independently performs ADLs?: Yes (appropriate for developmental age) Is the patient deaf or have difficulty hearing?: No Does the patient have difficulty seeing, even when wearing glasses/contacts?: No Does the patient have difficulty concentrating, remembering, or making decisions?: No  Permission Sought/Granted                  Emotional Assessment              Admission diagnosis:  Metastasis to brain [facility]) [C79.31] Brain lesion [G93.9] Altered mental status, unspecified altered mental status type [R41.82] Patient Active Problem List   Diagnosis Date Noted   Metastatic primary lung cancer (HCC) 08/13/2024   Sepsis (HCC) 08/13/2024   Elevated troponin 08/13/2024   Metastasis to brain (HCC) 08/12/2024   AKI (acute kidney injury) 03/05/2023   CHF exacerbation (HCC) 03/04/2023   Acute on chronic systolic heart failure (HCC) 03/04/2023   CKD  (chronic kidney disease) stage 3, GFR 30-59 ml/min (HCC) 03/04/2023   Unstable angina (HCC) 02/22/2023   ACS (acute coronary syndrome) (HCC) 02/22/2023   Atrial flutter (HCC) 07/08/2022   HFrEF (heart failure with reduced ejection fraction) (HCC) 07/08/2022   Atrial fibrillation (HCC) 07/07/2022   Non-ST elevation (NSTEMI) myocardial infarction (HCC) 07/07/2022   NSTEMI (non-ST elevated myocardial infarction) (HCC) 07/07/2022   Hyperlipidemia 04/25/2013   Claudication of lower extremity 08/15/2012   HTN (hypertension) 08/15/2012   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 Myoview low risk 12/13 08/15/2012   Renal artery stenosis:  40% right renal artery 08/15/2012   PVD, LCIA, LSFA PTA 08/15/12, residual Rt disease but minimal symptoms 08/15/2012   PCP:  Dayna Motto, DO Pharmacy:   [facility] [address] [place] - [address] [place] [address] Phone: [phone] Fax: [phone]  [facility] [address] [place] [address] Phone: [phone] Fax: [phone]     Social Drivers of Health (SDOH) Social History: SDOH Screenings   Food Insecurity: No Food Insecurity (08/14/2024)  Housing: Low Risk (08/14/2024)  Transportation Needs: No Transportation Needs (08/14/2024)  Utilities: Not At Risk (08/14/2024)  Social Connections: Moderately Isolated (08/14/2024)  Tobacco Use: Medium Risk (08/13/2024)   SDOH Interventions:     Readmission Risk Interventions     No data to display

## 2024-08-14 NOTE — Progress Notes (Signed)
 Echo unable to be attempted due to scheduling, will attempt asap  [facility]

## 2024-08-14 NOTE — Consult Note (Signed)
 "     Chief Complaint: Patient was seen in consultation today for metastatic disease of unknown primary  Referring Physician(s): Dr. Paula Holster  Supervising Physician: Luverne Aran  Patient Status: [facility] - In-pt  History of Present Illness: Jeffrey Norton is a 74 y.o. 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  He was scheduled for R adrenal mass biopsy today, however presented to the ED yesterday with AMS/confusion. IR consulted for inpatient biopsy.   Patient assessed at bedside this AM.  He is drowsy but arousable.  Family at bedside interested in pursuing biopsy for diagnosis if possible.  He was up to 5L [place] overnight, now on 2L with O2 sats 92-93% during visit.  HR stable.   Past Medical History:  Diagnosis Date   Coronary artery disease    Hyperlipidemia    Hypertension    Myocardial infarction [facility])    PVD (peripheral vascular disease)    S/P angioplasty with stent to Lt. iliac 08/15/12 08/15/2012    Past Surgical History:  Procedure Laterality Date   2D Echocardiogram  08/25/2004   EF >55%, LA-moderately dilated.   BIOPSY  03/05/2023   Procedure: BIOPSY;  Surgeon: Rollin Dover, MD;  Location: [facility] ENDOSCOPY;  Service: Gastroenterology;;   CARDIAC CATHETERIZATION  03/11/2002   RCA subtotally occluded with TIMI-1 to1/2 flow stented with a 3x23 Cypher DES resulting in reduction of occlusion to 0% with TIMI-3 flow   CORONARY BALLOON ANGIOPLASTY N/A 02/22/2023   Procedure: CORONARY BALLOON ANGIOPLASTY;  Surgeon: Dann Candyce RAMAN, MD;  Location: [facility] INVASIVE CV LAB;  Service: Cardiovascular;  Laterality: N/A;   CORONARY THROMBECTOMY N/A 02/22/2023   Procedure: Coronary Thrombectomy;  Surgeon: Dann Candyce RAMAN, MD;  Location: [facility] INVASIVE CV LAB;  Service: Cardiovascular;  Laterality: N/A;   ESOPHAGOGASTRODUODENOSCOPY (EGD) WITH PROPOFOL  N/A 03/05/2023   Procedure:  ESOPHAGOGASTRODUODENOSCOPY (EGD) WITH PROPOFOL ;  Surgeon: Rollin Dover, MD;  Location: [facility] ENDOSCOPY;  Service: Gastroenterology;  Laterality: N/A;   LEA Doppler  08/29/2012   L SFA Proximal: 70-99% diameter reduction. Velocities suggest upper end of range-new finding when compared to previous study. L EIA stent: opne and patent without evidence of restenosis   LEFT HEART CATH AND CORONARY ANGIOGRAPHY N/A 07/07/2022   Procedure: LEFT HEART CATH AND CORONARY ANGIOGRAPHY;  Surgeon: Jordan, Peter M, MD;  Location: [facility] INVASIVE CV LAB;  Service: Cardiovascular;  Laterality: N/A;   LEFT HEART CATH AND CORONARY ANGIOGRAPHY N/A 02/22/2023   Procedure: LEFT HEART CATH AND CORONARY ANGIOGRAPHY;  Surgeon: Dann Candyce RAMAN, MD;  Location: [facility] INVASIVE CV LAB;  Service: Cardiovascular;  Laterality: N/A;   Lexiscan  Myoview  07/19/2012   No significant ECG changes, mild inferobasal hypokinesis with normal LV function, low risk stress nuclear study   PV Intervention  08/15/2012   Occluded L External Iliac-stented with a 10x38mm Abbott Nitinol Absolute Pro self-expanding stent. 75% stenosis in L Common Femoral artery stented witha 8x1mm lon Absolute Nitinol Absolute Po self-expanding stent.    Allergies: Heparin   Medications: Prior to Admission medications  Medication Sig Start Date End Date Taking? Authorizing Provider  APPLE CIDER VINEGAR PO Take 1 Dose by mouth See admin instructions. 2 teaspoons honey + apple cider vinegar to thin, every 6 hours as needed for cough.   Yes [provider]  Ascorbic Acid (VITAMIN C PO) Take 1 tablet by mouth daily.   Yes  [provider]  b complex vitamins capsule Take 1 capsule by mouth daily.   Yes [provider]  Coenzyme Q10 (COQ10 PO) Take 1 capsule by mouth daily.   Yes [provider]  furosemide  (LASIX ) 40 MG tablet Take 1 tablet (40 mg total) by mouth daily. As needed for fluid in legs, weight gain 03/06/23  Yes Pokhrel, Laxman,  MD  guaiFENesin -codeine  100-10 MG/5ML syrup Take 5 mLs by mouth at bedtime as needed for cough. 07/26/24  Yes Hattar, Zola SAILOR, MD  MAGNESIUM PO Take 1 tablet by mouth daily.   Yes [provider]  metoprolol  succinate (TOPROL -XL) 25 MG 24 hr tablet Take 1 tablet (25 mg total) by mouth daily. 06/08/23  Yes Court Dorn PARAS, MD  Multiple Vitamins-Minerals (ZINC PO) Take 1 tablet by mouth daily.   Yes [provider]  QUERCETIN PO Take 1 tablet by mouth daily.   Yes [provider]  Vitamin D-Vitamin K (VITAMIN K2-VITAMIN D3 PO) Take 1 tablet by mouth daily.   Yes [provider]  vitamin E 180 MG (400 UNITS) capsule Take 400 Units by mouth daily.   Yes [provider]  aspirin  EC 81 MG tablet Take 81 mg by mouth every other day. Patient not taking: Reported on 08/13/2024    [provider]  budesonide -glycopyrrolate -formoterol  (BREZTRI  AEROSPHERE) 160-9-4.8 MCG/ACT AERO inhaler Inhale 2 puffs into the lungs in the morning and at bedtime. Patient not taking: Reported on 08/13/2024 07/03/24   Zaida Zola SAILOR, MD  clopidogrel  (PLAVIX ) 75 MG tablet Take 1 tablet (75 mg total) by mouth daily. Patient not taking: Reported on 08/13/2024 06/08/23   Court Dorn PARAS, MD  ipratropium-albuterol  (DUONEB) 0.5-2.5 (3) MG/3ML SOLN Take 3 mLs by nebulization every 4 (four) hours as needed. Patient not taking: Reported on 08/13/2024 07/03/24   Zaida Zola SAILOR, MD  nitroGLYCERIN  (NITROSTAT ) 0.4 MG SL tablet Place 1 tablet (0.4 mg total) under the tongue every 5 (five) minutes as needed for chest pain. Patient not taking: Reported on 08/13/2024 07/08/22   Henry Shaver B, NP  olmesartan (BENICAR) 40 MG tablet Take 40 mg by mouth daily. Patient not taking: Reported on 08/13/2024 05/26/23   [provider]     Family History  Problem Relation Age of Onset   Diabetes Mother    Heart disease Mother 88   Hyperlipidemia Mother    Kidney disease Mother     Heart disease Father 47   Cancer Father 75   Stroke Maternal Grandfather        Mid 73s   Lung disease Paternal Grandfather        Black Lung Disease    Social History   Socioeconomic History   Marital status: Married    Spouse name: Not on file   Number of children: Not on file   Years of education: Not on file   Highest education level: Not on file  Occupational History   Not on file  Tobacco Use   Smoking status: Former    Current packs/day: 0.00    Types: Cigarettes    Quit date: 08/16/1998    Years since quitting: 26.0   Smokeless tobacco: Never  Vaping Use   Vaping status: Never Used  Substance and Sexual Activity   Alcohol use: No   Drug use: No   Sexual activity: Not on file  Other Topics Concern   Not on file  Social History Narrative   Not on file  Social Drivers of Health   Tobacco Use: Medium Risk (08/13/2024)   Patient History    Smoking Tobacco Use: Former    Smokeless Tobacco Use: Never    Passive Exposure: Not on file  Financial Resource Strain: Not on file  Food Insecurity: No Food Insecurity (08/14/2024)   Epic    Worried About Programme Researcher, Broadcasting/film/video in the Last Year: Never true    Ran Out of Food in the Last Year: Never true  Transportation Needs: No Transportation Needs (08/14/2024)   Epic    Lack of Transportation (Medical): No    Lack of Transportation (Non-Medical): No  Physical Activity: Not on file  Stress: Not on file  Social Connections: Moderately Isolated (08/14/2024)   Social Connection and Isolation Panel    Frequency of Communication with Friends and Family: Three times a week    Frequency of Social Gatherings with Friends and Family: Three times a week    Attends Religious Services: Never    Active Member of Clubs or Organizations: No    Attends Banker Meetings: Never    Marital Status: Married  Depression (PHQ2-9): Not on file  Alcohol Screen: Not on file  Housing: Low Risk (08/14/2024)   Epic    Unable to Pay  for Housing in the Last Year: No    Number of Times Moved in the Last Year: 0    Homeless in the Last Year: No  Utilities: Not At Risk (08/14/2024)   Epic    Threatened with loss of utilities: No  Health Literacy: Not on file     Review of Systems: A 12 point ROS discussed and pertinent positives are indicated in the HPI above.  All other systems are negative.  Review of Systems  Constitutional:  Positive for fatigue. Negative for fever.  Respiratory:  Positive for shortness of breath. Negative for cough.   Cardiovascular:  Negative for chest pain.  Psychiatric/Behavioral:  Positive for confusion. Negative for behavioral problems.     Vital Signs: BP 127/70   Pulse 74   Temp 97.8 F (36.6 C) (Axillary)   Resp (!) 22   SpO2 100%   Physical Exam Vitals and nursing note reviewed.  Constitutional:      General: He is not in acute distress.    Appearance: Normal appearance. He is not ill-appearing.  Cardiovascular:     Rate and Rhythm: Regular rhythm. Tachycardia present.  Pulmonary:     Effort: No respiratory distress.     Comments: Mildly increased work of breathing, on 2-5L [place] Abdominal:     General: Abdomen is flat. There is no distension.     Palpations: Abdomen is soft.  Skin:    General: Skin is warm and dry.  Neurological:     Mental Status: He is alert and oriented to person, place, and time. Mental status is at baseline.     Comments: Alert and oriented, but drowsy and falls asleep easily.   Psychiatric:        Mood and Affect: Mood normal.        Behavior: Behavior normal.        Thought Content: Thought content normal.        Judgment: Judgment normal.          Imaging: MR Brain W and Wo Contrast Result Date: 08/12/2024 EXAM: MRI BRAIN WITH AND WITHOUT CONTRAST 08/12/2024 11:12:35 PM TECHNIQUE: Multiplanar multisequence MRI of the head/brain was performed with and without the administration of intravenous  contrast. 7 mL of gadobutrol  (GADAVIST ) 1  MMOL/ML injection was administered. COMPARISON: None available. CLINICAL HISTORY: Metastatic disease evaluation. FINDINGS: BRAIN AND VENTRICLES: No acute infarct. There are 3 contrast-enhancing metastatic lesions within the brain: 1. Left frontal lobe 3.2 x 3.0 cm with surrounding moderate vasogenic edema, heterogeneous internal signal with hemorrhagic changes. 2. Parasagittal right occipital lobe 1.7 x 1.4 cm with mild surrounding vasogenic edema and internal hemorrhagic change. 3. Superior right frontal lobe 7 mm with mild surrounding edema and evidence of prior hemorrhage. There is mild mass effect on the left lateral ventricle but no midline shift or herniation. No hydrocephalus. The sella is unremarkable. Normal flow voids. ORBITS: No significant abnormality. SINUSES: No significant abnormality. BONES AND SOFT TISSUES: Normal bone marrow signal. No soft tissue abnormality. IMPRESSION: 1. Three contrast-enhancing intracranial metastatic lesions with associated hemorrhagic change and vasogenic edema, largest in the left frontal lobe (3.2 x 3.0 cm), with additional lesions in the parasagittal right occipital lobe (1.7 x 1.4 cm) and superior right frontal lobe (7 mm). 2. Mild mass effect on the left lateral ventricle without midline shift or herniation. Electronically signed by: Franky Stanford MD 08/12/2024 11:26 PM EST RP Workstation: [id number]   CT ABDOMEN PELVIS W CONTRAST Result Date: 08/12/2024 EXAM: CT ABDOMEN AND PELVIS WITH CONTRAST 08/12/2024 10:42:24 PM TECHNIQUE: CT of the abdomen and pelvis was performed with the administration of 75 mL of iohexol  (OMNIPAQUE ) 350 MG/ML injection. Multiplanar reformatted images are provided for review. Automated exposure control, iterative reconstruction, and/or weight-based adjustment of the mA/kV was utilized to reduce the radiation dose to as low as reasonably achievable. COMPARISON: CT abdomen and pelvis 10/24/2006. CLINICAL HISTORY: Metastatic disease evaluation.  FINDINGS: LOWER CHEST: There are increasing fibrotic changes in the lung bases. LIVER: The liver is unremarkable. GALLBLADDER AND BILE DUCTS: The gallbladder is surgically absent. No biliary ductal dilatation. SPLEEN: No acute abnormality. PANCREAS: No acute abnormality. ADRENAL GLANDS: There is an enlarging 6.1 x 4.9 cm right adrenal mass arg . Left adrenal gland within normal limits. KIDNEYS, URETERS AND BLADDER: There is a punctate calculus in the inferior pole of the right kidney. There are bilateral renal cysts measuring up to 2.8 cm on the right and 3.5 cm on the left. Per consensus, no follow-up is needed for simple Bosniak type 1 and 2 renal cysts, unless the patient has a malignancy history or risk factors. No hydronephrosis. No perinephric or periureteral stranding. Urinary bladder is unremarkable. GI AND BOWEL: Stomach demonstrates no acute abnormality. Sigmoid and descending colon diverticulosis is present. The appendix appears normal. There is no bowel obstruction. PERITONEUM AND RETROPERITONEUM: No ascites. No free air. VASCULATURE: Aorta is normal in caliber. Severe atherosclerotic disease throughout the aorta. 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 LYMPH NODES: No lymphadenopathy. REPRODUCTIVE ORGANS: There are calcifications in the prostate gland. BONES AND SOFT TISSUES: No acute osseous abnormality. No focal soft tissue abnormality. IMPRESSION: 1. No acute findings. 2. Enlarging 6.1 x 4.9 cm right adrenal mass. 3. 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 Electronically signed by: Greig Pique MD 08/12/2024 11:05 PM EST RP Workstation: [id number]   CT ANGIO HEAD NECK W WO CM Result Date: 08/12/2024 EXAM: CTA Head and Neck with and without Intravenous Contrast. CT Head without Contrast. CLINICAL HISTORY: Neuro deficit, acute, stroke suspected. TECHNIQUE: Axial CTA images of the head and neck performed with and without intravenous contrast. MIP  reconstructed images were created and reviewed. Axial computed tomography images of the  head/brain performed without intravenous contrast. Note: Per PQRS, the description of internal carotid artery percent stenosis, including 0 percent or normal exam, is based on [employer] American Symptomatic Carotid Endarterectomy Trial (NASCET) criteria. Dose reduction technique was used including one or more of the following: automated exposure control, adjustment of mA and kV according to patient size, and/or iterative reconstruction. CONTRAST: With and without; 75 mL iohexol  (OMNIPAQUE ) 350 MG/ML injection. COMPARISON: None provided. FINDINGS: CT HEAD: BRAIN: There are hyperdense masses within the brain. The first is at the left frontal lobe and measures 3.3 x 3.1 cm with moderate surrounding vasogenic edema. The second is at the medial right occipital lobe and measures 1.8 x 1.8 cm with moderate surrounding vasogenic edema. A third lesion at the superior right frontal lobe measures 9 mm with mild surrounding vasogenic edema. No CT evidence for acute territorial infarct. No midline shift or extra-axial collection. VENTRICLES: No hydrocephalus. ORBITS: The orbits are unremarkable. SINUSES AND MASTOIDS: The paranasal sinuses and mastoid air cells are clear. CTA NECK: COMMON CAROTID ARTERIES: There is mixed density atherosclerotic disease at both carotid bifurcations. On the right, this results in less than 50% stenosis. On the left, it results in approximately 50% stenosis. No dissection or occlusion. INTERNAL CAROTID ARTERIES: Both internal carotid arteries remain widely patent to the skull base. No stenosis by NASCET criteria. No dissection or occlusion. VERTEBRAL ARTERIES: Moderate stenosis at the origin of the left vertebral artery which is otherwise widely patent with mild multifocal atherosclerotic calcification. No dissection or occlusion. CTA HEAD: ANTERIOR CEREBRAL ARTERIES: No significant stenosis. No occlusion. No  aneurysm. MIDDLE CEREBRAL ARTERIES: No significant stenosis. No occlusion. No aneurysm. POSTERIOR CEREBRAL ARTERIES: Fetal origin of the right PCA. No significant stenosis. No occlusion. No aneurysm. BASILAR ARTERY: No significant stenosis. No occlusion. No aneurysm. OTHER: There is mild atherosclerotic calcification of the carotid siphons. SOFT TISSUES: No acute finding. No masses or lymphadenopathy. BONES: No acute osseous abnormality. LUNGS: Complete collapse of the visible portion of the right lung with a large amount of right-sided pleural effusion incompletely visualized. IMPRESSION: 1. Hyperdense masses within the brain, including a 3.3 x 3.1 cm left frontal lobe lesion, a 1.8 x 1.8 cm medial right occipital lobe lesion, and a 9 mm superior right frontal lobe lesion, with associated vasogenic edema. This is likely metastatic disease and MRI with and without contrast is recommended for further evaluation. 2. Moderate stenosis at the origin of the left vertebral artery, which is otherwise widely patent with mild multifocal atherosclerotic calcification. 3. Mixed density atherosclerotic disease at both carotid bifurcations, resulting in less than 50% stenosis on the right and approximately 50% stenosis on the left, with both internal carotid arteries widely patent to the skull base. 4. Complete collapse of the visible portion of the right lung with a large amount of right-sided pleural effusion, incompletely visualized. Electronically signed by: Franky Stanford MD 08/12/2024 08:43 PM EST RP Workstation: [id number]   CT Angio Chest PE W and/or Wo Contrast Result Date: 08/12/2024 EXAM: CTA CHEST 08/12/2024 08:14:23 PM TECHNIQUE: CTA of the chest was performed without and with intravenous contrast. 75 mL of iohexol  (OMNIPAQUE ) 350 MG/ML injection was administered. Multiplanar reformatted images are provided for review. MIP images are provided for review. Automated exposure control, iterative reconstruction, and/or  weight based adjustment of the mA/kV was utilized to reduce the radiation dose to as low as reasonably achievable. COMPARISON: PET CT dated 07/12/2024. CLINICAL HISTORY: Pulmonary embolism (PE) suspected, high prob. FINDINGS: PULMONARY ARTERIES: Pulmonary arteries are  adequately opacified for evaluation. No acute pulmonary embolus. Main pulmonary artery is normal in caliber. MEDIASTINUM: Leftward cardiomediastinal shift. Moderate 3 vessel coronary atherosclerosis. Thoracic aortic atherosclerosis. LYMPH NODES: Small mediastinal and right hilar nodes, including the 13 mm short axis right paratracheal node, grossly unchanged. LUNGS AND PLEURA: 16.4 cm right upper lobe mass, corresponding to the patient's known primary bronchogenic carcinoma, previously 13.8 cm. No focal consolidation or pulmonary edema. No evidence of pleural effusion or pneumothorax. UPPER ABDOMEN: Status post cholecystectomy. 6.2 cm right adrenal mass (image 151) previously 4.7 cm. SOFT TISSUES AND BONES: Mild erosion of the right lateral 3rd through 5th ribs, reflecting osseous invasion. No acute soft tissue abnormality. IMPRESSION: 1. No pulmonary embolism. 2. Enlarging 16.4 cm right upper lobe mass, reflecting primary bronchogenic carcinoma. 3. Associated small thoracic nodal metastases, grossly unchanged. 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 4. Osseous invasion of the right lateral 3rd-5th ribs, similar to prior. Electronically signed by: Pinkie Pebbles MD 08/12/2024 08:40 PM EST RP Workstation: [id number]   DG Chest Port 1 View Result Date: 08/12/2024 EXAM: 1 VIEW(S) XRAY OF THE CHEST 08/12/2024 07:37:00 PM COMPARISON: PET CT dated 07/12/2024. CLINICAL HISTORY: fatigue fatigue fatigue FINDINGS: LUNGS AND PLEURA: Large right upper lobe mass, compatible with primary bronchogenic carcinoma. Coarse interstitial markings and scarring in the bilateral mid and lower lungs. No pleural effusion. No pneumothorax. HEART AND MEDIASTINUM: No  acute abnormality of the cardiac and mediastinal silhouettes. BONES AND SOFT TISSUES: No acute osseous abnormality. IMPRESSION: 1. Large right upper lobe mass, compatible with primary bronchogenic carcinoma. Electronically signed by: Pinkie Pebbles MD 08/12/2024 07:45 PM EST RP Workstation: [id number]    Labs:  CBC: Recent Labs    05/25/24 1529 08/12/24 1906 08/12/24 1912 08/13/24 0154 08/14/24 0917  WBC 16.0* 16.5*  --  15.4* 23.9*  HGB 15.8 9.8* 10.2*  9.9* 10.2* 10.0*  HCT 49.7 32.1* 30.0*  29.0* 33.8* 32.4*  PLT 379 342  --  360 337    COAGS: No results for input(s): INR, APTT in the last 8760 hours.  BMP: Recent Labs    05/25/24 1529 05/25/24 1732 08/12/24 1906 08/12/24 1912 08/13/24 0154 08/14/24 0333  NA 134*  --  137 139  139 136 135  K 6.1*   < > 4.9 4.7  4.8 5.6* 5.2*  CL 97*  --  101 102 99 96*  CO2 27  --  25  --  25 24  GLUCOSE 90  --  101* 101* 129* 133*  BUN 23  --  43* 41* 45* 72*  CALCIUM  9.7  --  10.6*  --  10.3 10.1  CREATININE 1.60*  --  1.43* 1.70* 1.56* 2.16*  GFRNONAA 45*  --  52*  --  47* 32*   < > = values in this interval not displayed.    LIVER FUNCTION TESTS: Recent Labs    08/12/24 1906  BILITOT 0.4  AST 19  ALT 6  ALKPHOS 121  PROT 7.0  ALBUMIN 2.6*    TUMOR MARKERS: No results for input(s): AFPTM, CEA, CA199, CHROMGRNA in the last 8760 hours.  Assessment and Plan: Metastatic disease of unknown primary Patient with evidence of metastatic disease with lung, brain, and adrenal masses. Was scheduled for outpatient adrenal mass biopsy today with IR, however admitted over the weekend with confusion/AMS. IR now asked to consider inpatient biopsy.  Work-up for presenting symptoms underway.  Started IV decadron  with expected changes in leukocytosis. Overnight patient with increased shortness of breath, increased O2  requirement from baseline of 2L [place] to 5L [place].  Still tachypneic at times.  Also with elevated  potassium despite dose of Lokelma .  Given his recent change with questionable ability to safely sedate, will defer biopsy today and reassess for possible biopsy tomorrow.  Pulm/CCM also follow for possible EBUS.  Also seen by Palliative Care with current goal of diagnosis per patient and family.   Risks and benefits of biopsy was discussed with the patient and/or patient's family including, but not limited to bleeding, infection, damage to adjacent structures or low yield requiring additional tests.  All of the questions were answered and there is agreement to proceed.  Consent signed and in chart.  Thank you for this interesting consult.  I greatly enjoyed meeting AKITO BOOMHOWER and look forward to participating in their care.  A copy of this report was sent to the requesting provider on this date.  Electronically Signed: Sruti Ayllon Sue-Ellen Dhruti Ghuman, PA 08/14/2024, 1:39 PM   I spent a total of 40 Minutes    in face to face in clinical consultation, greater than 50% of which was counseling/coordinating care for metastatic disease of unknown primary.   "

## 2024-08-14 NOTE — Consult Note (Signed)
 "                                                                                   Consultation Note Date: 08/14/2024   Patient Name: Jeffrey Norton  DOB: 12-11-1950  [name]  Age / Sex: 74 y.o., male  PCP: Dayna Motto, DO Referring Physician: Odell Castor, Erle, MD  Reason for Consultation:  medical decision making  HPI/Patient Profile: 74 y.o. 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 Workup revealed brain mass. Now with planned biopsy tomorrow 1/20. Palliative medicine consulted for assistance with medical decision making.    Primary Decision Maker NEXT OF KIN - spouse Willaim Mode  Discussion: Chart reviewed- historical imaging reviewed personally- CT scan in August 2024 showed RUL pulmonary nodule up to 2.1 cm suspicious for bronchogenic carcinoma and adrenal nodule 2.5cm with 2-3 mth f/u CT recommended. Unclear why there was not follow-up.  CT scan 05/25/2024 showed large R upper lobe mass 11.5cm and enlarging adrenal mass 3.6cm.  PET 07/12/24- showed adrenal mass 4.8cm and lung mass 14.0 cm as well as hypermetabolic lymph nodes.  CT chest this admission - shows lung mass is now 16.4cm.  MRI brain 08/12/24 reviewed- shows three brain lesions with hemorrhage and edema (L frontal 3.2cm, R occipital lobe 1.7cm, and superior R frontal 7mm). CBC 1/19- indicates WBC 23.9- important to note pt has been started on steroids.  BMET 1/19- with hyperkalemia 5.2 and Cr 2.16- elevated.  Per attending Dr. Castor note- plan is for biopsy in IR- orders have been placed  for biopsy tomorrow.  Patient has been in contact with Dr. Jessy office and the oncology navigator Warren Sora, RN.  On eval Mr. Micucci is sitting up in the chair. His spouse, Heron and daughter Jane are at bedside.  I introduced Palliative medicine.  Palliative medicine is specialized medical care for people living with serious illness. It focuses on providing relief from the symptoms and stress of a serious illness. The goal is to improve quality of life for both the patient and the family. Prior to admission Jeffrey Norton was living at home. Family has noted decline in his functional status over the last several months. He was an avid fisherman but has not been fishing since last September.  Jeffrey Norton was confused- referred to his spouse by his ex-spouse's name and referred to his daughter by his spouse's name. He was unable to participate in goals of care discussion.  Emotional support provided to [facility] as she shared the difficulty of this journey.  For now they are in a holding pattern awaiting more information from pathology results and looking forward to discussion treatment options with Dr. Timmy.  We discussed patient's values regarding health care. His family shares that he  has never really wanted big medical interventions. He has designated DNR status and would not want to be placed on artificial life support. They are uncertain if he would want to proceed with chemotherapy or radiation as in the past he has expressed a distaste for it when he has seen others struggle. However, they do want to proceed with biopsy and hear about what the options are for treatment.  We discussed if decision is made not to proceed with cancer directed therapy, then patient would be eligible for hospice services.  Hospice services and philosophy of care were discussed. I also discussed outpatient Palliative follow-up as well.   Lennin denied pain or other symptoms. His wife noted that he was agitated last evening  and had difficulty going to sleep- however, after he had a BM he was able to settle down to sleep right away.  We discussed steroids may be impacting his sleep, as well as if he can't communicate his need to have a BM, this could also make him agitated.   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 Timmy. They understand patient is eligible for hospice services if he/family decide not to go forward with chemo/radiation  Code Status/Advance Care Planning:   Code Status: Limited: Do not attempt resuscitation (DNR) -DNR-LIMITED -Do Not Intubate/DNI     Prognosis:   Unable to determine  Discharge Planning: To Be Determined  Primary Diagnoses: Present on Admission:  Metastasis to brain [facility])   Review of Systems  Unable to perform ROS: Mental status change    Physical Exam Vitals and nursing note reviewed.  Constitutional:      General: He is not in acute distress.    Comments: thin  Cardiovascular:     Rate and Rhythm: Normal rate.  Pulmonary:     Effort: Pulmonary effort is normal.  Skin:    General: Skin is warm and dry.  Neurological:     Mental Status: He is alert. He is disoriented.     Vital Signs: BP 127/70   Pulse 74   Temp 97.8 F (36.6 C) (Axillary)   Resp (!) 22   SpO2 100%  Pain Scale: 0-10   Pain Score: 0-No pain   SpO2: SpO2: 100 % O2 Device:SpO2: 100 % O2 Flow Rate: .O2 Flow Rate (L/min): 5 L/min  IO: Intake/output summary:  Intake/Output Summary (Last 24 hours) at 08/14/2024 1301 Last data filed at 08/14/2024 1200 Gross per 24 hour  Intake 240 ml  Output --  Net 240 ml    LBM: Last BM Date : 08/14/24 Baseline Weight:   Most recent weight:         Thank you for this consult. Palliative medicine will continue to follow and assist as needed.   Signed by: Cassondra Stain,  AGNP-C Palliative Medicine  Billing based on MDM: High  Problems Addressed: One acute or chronic illness or injury that poses a threat to life or bodily function   Risks: Decision regarding hospitalization or escalation of hospital care  Please contact Palliative Medicine Team phone at [phone] for questions and concerns.  For individual provider: See Amion               "

## 2024-08-14 NOTE — Consult Note (Addendum)
 "  NAME:  Jeffrey Norton, MRN:  [id number], DOB:  06-09-1951, LOS: 2 ADMISSION DATE:  08/12/2024, CONSULTATION DATE:  08/14/24 REFERRING MD:  Dr. Odell Castor, CHIEF COMPLAINT:  Lung mass   History of Present Illness:   Jeffrey Norton is a 74 year old male with a history of coronary artery disease status post multiple PCIs, paroxysmal AFib for which he previously declined anticoagulation, HFrEF, chronic kidney disease, hypertension, history of GI bleeds, and chronic hypoxic respiratory failure on two liters who presents for changes in mental status now found to have multiple brain masses in the left frontal right occipital and right frontal lobe with vasogenic edema likely due to known lung mass with adrenal metastasis. Pulmonology consulted for evaluation and management of the latter.  The patient is a 74 year old with coronary artery disease and chronic hypoxic respiratory failure who presents with changes in mental status and confusion.  He presented to the emergency department due to changes in mental status and confusion. A CT scan of the head revealed multiple brain masses in the left frontal, right occipital, and right frontal lobes with vasogenic edema. He has been on steroids for brain swelling.  He has a known lung mass in the right upper lobe, measuring 15 cm by 8 cm, identified on May 25, 2024. A PET CT on July 12, 2024, showed the mass to be hypermetabolic with mediastinal and hilar adenopathy bilaterally. He was scheduled for a biopsy of the lung mass by interventional radiology, but it was delayed. He has been off Plavix  and aspirin  for five days in preparation for the biopsy. He has experienced weight loss and has a history of hemoptysis, which initially led to the discovery of the lung mass.  He has a history of coronary artery disease with multiple percutaneous coronary interventions, paroxysmal atrial fibrillation for which he previously declined anticoagulation, heart  failure with reduced ejection fraction, pericardial effusion, chronic kidney disease, hypertension, history of gastrointestinal bleeds, and chronic hypoxic respiratory failure on two liters of oxygen . He is currently on two liters of oxygen  and has been experiencing confusion, which prompted the emergency visit.  He has a family history of lung cancer, specifically mentioning a relative named [facility]. He has a history of smoking.  Pertinent  Medical History   Past Medical History:  Diagnosis Date   Coronary artery disease    Hyperlipidemia    Hypertension    Myocardial infarction [facility])    PVD (peripheral vascular disease)    S/P angioplasty with stent to Lt. iliac 08/15/12 08/15/2012    Significant Hospital Events: Including procedures, antibiotic start and stop dates in addition to other pertinent events   1/19: Admit for brain mets with vasogenic edema  Interim History / Subjective:  As above  Objective    Blood pressure 127/70, pulse 74, temperature 97.8 F (36.6 C), temperature source Axillary, resp. rate (!) 22, SpO2 100%.       No intake or output data in the 24 hours ending 08/14/24 1028 There were no vitals filed for this visit.  Examination: General: thin man, chronically ill appearing, cachectic HENT: anicteric sclera, well injected conjunctivae, oral and nasal mucosa intact Lungs: decreased breath sounds in right upper lung field, scattered wheezes, + clubbign Cardiovascular: distant heart sounds Abdomen: soft, non-tender Extremities: no edema Neuro: alert, oriented to person and place, however, intermittently confuses things or says things that are non-sensible  GU: deferred  Radiology: Head CT: Multiple intracranial masses in the left  frontal, right occipital, and right frontal lobes with associated vasogenic edema Chest CT (05/25/2024): Right upper lobe mass measuring 15 cm x 8 cm PET CT (07/12/2024): Hypermetabolic right upper lobe mass with bilateral  mediastinal and hilar lymphadenopathy Brain MRI: Left frontal lobe mass 3 cm x 3 cm with moderate vasogenic edema; right occipital lobe mass 1.7 cm x 1 cm with vasogenic edema Abdominal imaging: Enlarging right adrenal mass measuring 6.1 cm x 4.9 cm  Resolved problem list   Assessment and Plan   Right upper lobe lung mass, pending malignancy diagnosis Large hypermetabolic mass in right upper lobe with mediastinal and hilar adenopathy. Biopsy pending. Discussed biopsy options and associated risks. Cardiologist clearance obtained for anesthesia. - Coordinated with interventional radiology for transthoracic versus adrenal biopsy - NPO at MN - Continue to hold plavix  and aspirin  - If IR not able to biopsy tomorrow, will proceed with EBUS  Brain metastases with cerebral edema Multiple brain masses with vasogenic edema. Steroids initiated, improving symptoms. - Continue steroid therapy to manage cerebral edema.  Right adrenal mass, pending malignancy diagnosis Enlarged right adrenal mass. Biopsy pending. - Coordinated with interventional radiology for adrenal mass biopsy.  Chronic hypoxic respiratory failure Managed with supplemental oxygen . - Continue supplemental oxygen  at 2 liters.  Patient Lines/Drains/Airways Status     Active Line/Drains/Airways     Name Placement date Placement time Site Days   Peripheral IV 08/12/24 20 G 1.88 Anterior;Proximal;Right Forearm 08/12/24  1918  Forearm  2            Labs   CBC: Recent Labs  Lab 08/12/24 1906 08/12/24 1912 08/13/24 0154 08/14/24 0917  WBC 16.5*  --  15.4* 23.9*  NEUTROABS 14.5*  --   --   --   HGB 9.8* 10.2*  9.9* 10.2* 10.0*  HCT 32.1* 30.0*  29.0* 33.8* 32.4*  MCV 96.1  --  95.2 94.7  PLT 342  --  360 337    Basic Metabolic Panel: Recent Labs  Lab 08/12/24 1906 08/12/24 1912 08/13/24 0154 08/14/24 0333  NA 137 139  139 136 135  K 4.9 4.7  4.8 5.6* 5.2*  CL 101 102 99 96*  CO2 25  --  25 24   GLUCOSE 101* 101* 129* 133*  BUN 43* 41* 45* 72*  CREATININE 1.43* 1.70* 1.56* 2.16*  CALCIUM  10.6*  --  10.3 10.1  MG 2.4  --   --   --    GFR: Estimated Creatinine Clearance: 28 mL/min (A) (by C-G formula based on SCr of 2.16 mg/dL (H)). Recent Labs  Lab 08/12/24 1906 08/12/24 1913 08/12/24 2053 08/13/24 0154 08/14/24 0917  WBC 16.5*  --   --  15.4* 23.9*  LATICACIDVEN  --  2.1* 0.8  --   --     Liver Function Tests: Recent Labs  Lab 08/12/24 1906  AST 19  ALT 6  ALKPHOS 121  BILITOT 0.4  PROT 7.0  ALBUMIN 2.6*   No results for input(s): LIPASE, AMYLASE in the last 168 hours. No results for input(s): AMMONIA in the last 168 hours.  ABG    Component Value Date/Time   HCO3 27.8 08/12/2024 1912   TCO2 26 08/12/2024 1912   TCO2 29 08/12/2024 1912   O2SAT 92 08/12/2024 1912     Coagulation Profile: No results for input(s): INR, PROTIME in the last 168 hours.  Cardiac Enzymes: No results for input(s): CKTOTAL, CKMB, CKMBINDEX, TROPONINI in the last 168 hours.  HbA1C: Hgb A1c  MFr Bld  Date/Time Value Ref Range Status  07/07/2022 07:23 PM 5.7 (H) 4.8 - 5.6 % Final    Comment:    (NOTE)         Prediabetes: 5.7 - 6.4         Diabetes: >6.4         Glycemic control for adults with diabetes: <7.0     CBG: No results for input(s): GLUCAP in the last 168 hours.  Review of Systems:   Not obtained  Past Medical History:  He,  has a past medical history of Coronary artery disease, Hyperlipidemia, Hypertension, Myocardial infarction (HCC), PVD (peripheral vascular disease), and S/P angioplasty with stent to Lt. iliac 08/15/12 (08/15/2012).   Surgical History:   Past Surgical History:  Procedure Laterality Date   2D Echocardiogram  08/25/2004   EF >55%, LA-moderately dilated.   BIOPSY  03/05/2023   Procedure: BIOPSY;  Surgeon: Rollin Dover, MD;  Location[facility] ENDOSCOPY;  Service: Gastroenterology;;   CARDIAC CATHETERIZATION  03/11/2002    RCA subtotally occluded with TIMI-1 to1/2 flow stented with a 3x23 Cypher DES resulting in reduction of occlusion to 0% with TIMI-3 flow   CORONARY BALLOON ANGIOPLASTY N/A 02/22/2023   Procedure: CORONARY BALLOON ANGIOPLASTY;  Surgeon: Dann Candyce RAMAN, MD;  Location: [facility] INVASIVE CV LAB;  Service: Cardiovascular;  Laterality: N/A;   CORONARY THROMBECTOMY N/A 02/22/2023   Procedure: Coronary Thrombectomy;  Surgeon: Dann Candyce RAMAN, MD;  Location: [facility] INVASIVE CV LAB;  Service: Cardiovascular;  Laterality: N/A;   ESOPHAGOGASTRODUODENOSCOPY (EGD) WITH PROPOFOL  N/A 03/05/2023   Procedure: ESOPHAGOGASTRODUODENOSCOPY (EGD) WITH PROPOFOL ;  Surgeon: Rollin Dover, MD;  Location: [facility] ENDOSCOPY;  Service: Gastroenterology;  Laterality: N/A;   LEA Doppler  08/29/2012   L SFA Proximal: 70-99% diameter reduction. Velocities suggest upper end of range-new finding when compared to previous study. L EIA stent: opne and patent without evidence of restenosis   LEFT HEART CATH AND CORONARY ANGIOGRAPHY N/A 07/07/2022   Procedure: LEFT HEART CATH AND CORONARY ANGIOGRAPHY;  Surgeon: Jordan, Peter M, MD;  Location: [facility] INVASIVE CV LAB;  Service: Cardiovascular;  Laterality: N/A;   LEFT HEART CATH AND CORONARY ANGIOGRAPHY N/A 02/22/2023   Procedure: LEFT HEART CATH AND CORONARY ANGIOGRAPHY;  Surgeon: Dann Candyce RAMAN, MD;  Location: [facility] INVASIVE CV LAB;  Service: Cardiovascular;  Laterality: N/A;   Lexiscan  Myoview  07/19/2012   No significant ECG changes, mild inferobasal hypokinesis with normal LV function, low risk stress nuclear study   PV Intervention  08/15/2012   Occluded L External Iliac-stented with a 10x43mm Abbott Nitinol Absolute Pro self-expanding stent. 75% stenosis in L Common Femoral artery stented witha 8x55mm lon Absolute Nitinol Absolute Po self-expanding stent.     Social History:   reports that he quit smoking about 26 years ago. His smoking use included cigarettes. He has never used smokeless  tobacco. He reports that he does not drink alcohol and does not use drugs.   Family History:  His family history includes Cancer (age of onset: 83) in his father; Diabetes in his mother; Heart disease (age of onset: 12) in his father; Heart disease (age of onset: 93) in his mother; Hyperlipidemia in his mother; Kidney disease in his mother; Lung disease in his paternal grandfather; Stroke in his maternal grandfather.   Allergies Allergies[1]   Home Medications  Prior to Admission medications  Medication Sig Start Date End Date Taking? Authorizing Provider  APPLE CIDER VINEGAR PO Take 1 Dose by mouth See admin instructions. 2  teaspoons honey + apple cider vinegar to thin, every 6 hours as needed for cough.   Yes [provider]  Ascorbic Acid (VITAMIN C PO) Take 1 tablet by mouth daily.   Yes [provider]  b complex vitamins capsule Take 1 capsule by mouth daily.   Yes [provider]  Coenzyme Q10 (COQ10 PO) Take 1 capsule by mouth daily.   Yes [provider]  furosemide  (LASIX ) 40 MG tablet Take 1 tablet (40 mg total) by mouth daily. As needed for fluid in legs, weight gain 03/06/23  Yes Pokhrel, Laxman, MD  guaiFENesin -codeine  100-10 MG/5ML syrup Take 5 mLs by mouth at bedtime as needed for cough. 07/26/24  Yes Hattar, Zola SAILOR, MD  MAGNESIUM PO Take 1 tablet by mouth daily.   Yes [provider]  metoprolol  succinate (TOPROL -XL) 25 MG 24 hr tablet Take 1 tablet (25 mg total) by mouth daily. 06/08/23  Yes Court Dorn PARAS, MD  Multiple Vitamins-Minerals (ZINC PO) Take 1 tablet by mouth daily.   Yes [provider]  QUERCETIN PO Take 1 tablet by mouth daily.   Yes [provider]  Vitamin D-Vitamin K (VITAMIN K2-VITAMIN D3 PO) Take 1 tablet by mouth daily.   Yes [provider]  vitamin E 180 MG (400 UNITS) capsule Take 400 Units by mouth daily.   Yes [provider]  aspirin  EC 81 MG tablet Take 81 mg by  mouth every other day. Patient not taking: Reported on 08/13/2024    [provider]  budesonide -glycopyrrolate -formoterol  (BREZTRI  AEROSPHERE) 160-9-4.8 MCG/ACT AERO inhaler Inhale 2 puffs into the lungs in the morning and at bedtime. Patient not taking: Reported on 08/13/2024 07/03/24   Zaida Zola SAILOR, MD  clopidogrel  (PLAVIX ) 75 MG tablet Take 1 tablet (75 mg total) by mouth daily. Patient not taking: Reported on 08/13/2024 06/08/23   Court Dorn PARAS, MD  ipratropium-albuterol  (DUONEB) 0.5-2.5 (3) MG/3ML SOLN Take 3 mLs by nebulization every 4 (four) hours as needed. Patient not taking: Reported on 08/13/2024 07/03/24   Zaida Zola SAILOR, MD  nitroGLYCERIN  (NITROSTAT ) 0.4 MG SL tablet Place 1 tablet (0.4 mg total) under the tongue every 5 (five) minutes as needed for chest pain. Patient not taking: Reported on 08/13/2024 07/08/22   Henry Shaver B, NP  olmesartan (BENICAR) 40 MG tablet Take 40 mg by mouth daily. Patient not taking: Reported on 08/13/2024 05/26/23   [provider]     Thank you for this interesting consult. I have spent 75 minutes evaluating patient, reviewing chart, and discussing plan of care with patient, family, and primary medical team. If you have any questions or concerns please reach out to me via secure chat.  Paula Southerly, MD [facility]             [1]  Allergies Allergen Reactions   Heparin      bleeding   "

## 2024-08-14 NOTE — Progress Notes (Addendum)
 [facility] PROGRESS NOTE    Progress Note  ESVIN HNAT  FMW:[id number] DOB: [birth year] DOA: 08/12/2024 PCP: Dayna Motto, DO     Brief Narrative:   Jeffrey Norton is an 74 y.o. 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  White blood cell count of 16 hemoglobin of 9.  CT of the head showed multiple brain mass in the left frontal right occipital and right frontal lobe with vasogenic edema.  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 Mentation is significantly improved he is tolerating his diet. Transition Decadron  to oral. Anticoagulation and antiplatelet therapy were held. Neurosurgery on board.  Unlikely a candidate for surgery with multiple metastases in the brain chest and abdomen. Last of aspirin  and Plavix  was 5 days prior to admission. Consult pulmonary for possible bronchoscopy. Leukocytosis is likely due to steroids, he has remained afebrile.  Chronic HFrEF: Last 2D echo 2024 showed an EF of 40%. CT angio of the chest showed no pulmonary edema or pleural effusion.  proBNP is elevated. He has had good urine output does not appear fluid overload on physical exam urine is clear. He is not  in acute decompensated heart failure. Allow oral hydration.  Acute kidney injury on chronic kidney disease stage IIIa: Creatinine is rising will allow oral hydration. Continue IV fluids recheck basic metabolic panel in the morning.  Acute on chronic hypoxemic respiratory failure: Continue oxygen  saturations. Discontinue IV empiric antibiotics.  He has remained afebrile. Leukocytosis likely due to malignancy it will probably get worse with IV steroids.  Hyperkalemia: Potassium is improved continue IV fluids, give another dose of Lokelma . Recheck basic metabolic panel in the morning.  Elevated troponin/history of CAD: Likely due to demand ischemia he denies any chest pain or shortness of breath. Hold anticoagulation and antiplatelet therapy for possible biopsy  Normocytic anemia: No evidence of GI bleed hemoglobin on admission was 9 late last year with 16. After IV Lasix  is increasing concern about hemoconcentration.  Hold IV Lasix . Continue to monitor.  Mild hypercalcemia: Likely due to malignancy. Start IV fluids.  Check a BMP tomorrow morning.  Paroxysmal atrial fibrillation: He declined anticoagulation in the past.  Chronic kidney disease stage IIIa: His creatinine appears to be at baseline.  Peripheral artery disease: Noted asymptomatic.  Goals of care: Consult palliative care, as very poor prognosis.   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   In Pre-Arrest Conditions (Patient Is Breathing and Has A Pulse) Do not intubate. Provide all appropriate non-invasive  medical interventions. Avoid ICU transfer unless indicated or required.   Consent: Discussion documented in EHR or advanced directives reviewed      08/13/24 0025           Code Status History     Date Active Date Inactive Code Status Order ID Comments User Context   08/13/2024 0014 08/13/2024 0025 Limited: Do not attempt resuscitation (DNR) -DNR-LIMITED -Do Not Intubate/DNI  [id number]  Alfornia Madison, MD ED   03/04/2023 0530 03/06/2023 1836 Full Code [id number]  Laveda Roosevelt, MD ED   02/22/2023 1110 02/24/2023 0257 Full Code [id number]  Barrett, Shona MATSU, PA-C ED   07/07/2022 1907 07/08/2022 2127 Full Code [id number]  Jadine Aline BRAVO, PA-C Inpatient   07/07/2022 1619 07/07/2022 1907 Full Code [id number]  Jordan, Peter M, MD Inpatient         IV Access:   Peripheral IV   Procedures and diagnostic studies:   MR Brain W and Wo Contrast Result Date: 08/12/2024 EXAM: MRI BRAIN WITH AND WITHOUT CONTRAST 08/12/2024 11:12:35 PM TECHNIQUE: Multiplanar multisequence MRI of the head/brain was performed with and without the administration of intravenous contrast. 7 mL of gadobutrol  (GADAVIST ) 1 MMOL/ML injection was administered. COMPARISON: None available. CLINICAL HISTORY: Metastatic disease evaluation. FINDINGS: BRAIN AND VENTRICLES: No acute infarct. There are 3 contrast-enhancing metastatic lesions within the brain: 1. Left frontal lobe 3.2 x 3.0 cm with surrounding moderate vasogenic edema, heterogeneous internal signal with hemorrhagic changes. 2. Parasagittal right occipital lobe 1.7 x 1.4 cm with mild surrounding vasogenic edema and internal hemorrhagic change. 3. Superior right frontal lobe 7 mm with mild surrounding edema and evidence of prior hemorrhage. There is mild mass effect on the left lateral ventricle but no midline shift or herniation. No hydrocephalus. The sella is unremarkable. Normal flow voids. ORBITS: No significant abnormality. SINUSES: No significant abnormality. BONES  AND SOFT TISSUES: Normal bone marrow signal. No soft tissue abnormality. IMPRESSION: 1. Three contrast-enhancing intracranial metastatic lesions with associated hemorrhagic change and vasogenic edema, largest in the left frontal lobe (3.2 x 3.0 cm), with additional lesions in the parasagittal right occipital lobe (1.7 x 1.4 cm) and superior right frontal lobe (7 mm). 2. Mild mass effect on the left lateral ventricle without midline shift or herniation. Electronically signed by: Franky Stanford MD 08/12/2024 11:26 PM EST RP Workstation: [id number]   CT ABDOMEN PELVIS W CONTRAST Result Date: 08/12/2024 EXAM: CT ABDOMEN AND PELVIS WITH CONTRAST 08/12/2024 10:42:24 PM TECHNIQUE: CT of the abdomen and pelvis was performed with the administration of 75 mL of iohexol  (OMNIPAQUE ) 350 MG/ML injection. Multiplanar reformatted images are provided for review. Automated exposure control, iterative reconstruction, and/or weight-based adjustment of the mA/kV was utilized to reduce the radiation dose to as low as reasonably achievable. COMPARISON: CT abdomen and pelvis 10/24/2006. CLINICAL HISTORY: Metastatic disease evaluation. FINDINGS: LOWER CHEST: There are increasing fibrotic changes in the lung bases. LIVER: The liver is unremarkable. GALLBLADDER AND BILE DUCTS: The gallbladder is surgically absent. No biliary ductal dilatation. SPLEEN: No acute abnormality. PANCREAS: No acute abnormality. ADRENAL GLANDS: There is an enlarging 6.1 x 4.9 cm right adrenal mass arg . Left adrenal gland within normal limits. KIDNEYS, URETERS AND BLADDER: There is a punctate calculus in the inferior pole of the right kidney. There are bilateral renal cysts measuring up to 2.8  cm on the right and 3.5 cm on the left. Per consensus, no follow-up is needed for simple Bosniak type 1 and 2 renal cysts, unless the patient has a malignancy history or risk factors. No hydronephrosis. No perinephric or periureteral stranding. Urinary bladder is  unremarkable. GI AND BOWEL: Stomach demonstrates no acute abnormality. Sigmoid and descending colon diverticulosis is present. The appendix appears normal. There is no bowel obstruction. PERITONEUM AND RETROPERITONEUM: No ascites. No free air. VASCULATURE: Aorta is normal in caliber. Severe atherosclerotic disease throughout the aorta. 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 LYMPH NODES: No lymphadenopathy. REPRODUCTIVE ORGANS: There are calcifications in the prostate gland. BONES AND SOFT TISSUES: No acute osseous abnormality. No focal soft tissue abnormality. IMPRESSION: 1. No acute findings. 2. Enlarging 6.1 x 4.9 cm right adrenal mass. 3. 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 Electronically signed by: Greig Pique MD 08/12/2024 11:05 PM EST RP Workstation: [id number]   CT ANGIO HEAD NECK W WO CM Result Date: 08/12/2024 EXAM: CTA Head and Neck with and without Intravenous Contrast. CT Head without Contrast. CLINICAL HISTORY: Neuro deficit, acute, stroke suspected. TECHNIQUE: Axial CTA images of the head and neck performed with and without intravenous contrast. MIP reconstructed images were created and reviewed. Axial computed tomography images of the head/brain performed without intravenous contrast. Note: Per PQRS, the description of internal carotid artery percent stenosis, including 0 percent or normal exam, is based on [employer] American Symptomatic Carotid Endarterectomy Trial (NASCET) criteria. Dose reduction technique was used including one or more of the following: automated exposure control, adjustment of mA and kV according to patient size, and/or iterative reconstruction. CONTRAST: With and without; 75 mL iohexol  (OMNIPAQUE ) 350 MG/ML injection. COMPARISON: None provided. FINDINGS: CT HEAD: BRAIN: There are hyperdense masses within the brain. The first is at the left frontal lobe and measures 3.3 x 3.1 cm with moderate surrounding vasogenic edema.  The second is at the medial right occipital lobe and measures 1.8 x 1.8 cm with moderate surrounding vasogenic edema. A third lesion at the superior right frontal lobe measures 9 mm with mild surrounding vasogenic edema. No CT evidence for acute territorial infarct. No midline shift or extra-axial collection. VENTRICLES: No hydrocephalus. ORBITS: The orbits are unremarkable. SINUSES AND MASTOIDS: The paranasal sinuses and mastoid air cells are clear. CTA NECK: COMMON CAROTID ARTERIES: There is mixed density atherosclerotic disease at both carotid bifurcations. On the right, this results in less than 50% stenosis. On the left, it results in approximately 50% stenosis. No dissection or occlusion. INTERNAL CAROTID ARTERIES: Both internal carotid arteries remain widely patent to the skull base. No stenosis by NASCET criteria. No dissection or occlusion. VERTEBRAL ARTERIES: Moderate stenosis at the origin of the left vertebral artery which is otherwise widely patent with mild multifocal atherosclerotic calcification. No dissection or occlusion. CTA HEAD: ANTERIOR CEREBRAL ARTERIES: No significant stenosis. No occlusion. No aneurysm. MIDDLE CEREBRAL ARTERIES: No significant stenosis. No occlusion. No aneurysm. POSTERIOR CEREBRAL ARTERIES: Fetal origin of the right PCA. No significant stenosis. No occlusion. No aneurysm. BASILAR ARTERY: No significant stenosis. No occlusion. No aneurysm. OTHER: There is mild atherosclerotic calcification of the carotid siphons. SOFT TISSUES: No acute finding. No masses or lymphadenopathy. BONES: No acute osseous abnormality. LUNGS: Complete collapse of the visible portion of the right lung with a large amount of right-sided pleural effusion incompletely visualized. IMPRESSION: 1. Hyperdense masses within the brain, including a 3.3 x 3.1 cm left frontal lobe lesion, a 1.8 x  1.8 cm medial right occipital lobe lesion, and a 9 mm superior right frontal lobe lesion, with associated vasogenic  edema. This is likely metastatic disease and MRI with and without contrast is recommended for further evaluation. 2. Moderate stenosis at the origin of the left vertebral artery, which is otherwise widely patent with mild multifocal atherosclerotic calcification. 3. Mixed density atherosclerotic disease at both carotid bifurcations, resulting in less than 50% stenosis on the right and approximately 50% stenosis on the left, with both internal carotid arteries widely patent to the skull base. 4. Complete collapse of the visible portion of the right lung with a large amount of right-sided pleural effusion, incompletely visualized. Electronically signed by: Franky Stanford MD 08/12/2024 08:43 PM EST RP Workstation: [id number]   CT Angio Chest PE W and/or Wo Contrast Result Date: 08/12/2024 EXAM: CTA CHEST 08/12/2024 08:14:23 PM TECHNIQUE: CTA of the chest was performed without and with intravenous contrast. 75 mL of iohexol  (OMNIPAQUE ) 350 MG/ML injection was administered. Multiplanar reformatted images are provided for review. MIP images are provided for review. Automated exposure control, iterative reconstruction, and/or weight based adjustment of the mA/kV was utilized to reduce the radiation dose to as low as reasonably achievable. COMPARISON: PET CT dated 07/12/2024. CLINICAL HISTORY: Pulmonary embolism (PE) suspected, high prob. FINDINGS: PULMONARY ARTERIES: Pulmonary arteries are adequately opacified for evaluation. No acute pulmonary embolus. Main pulmonary artery is normal in caliber. MEDIASTINUM: Leftward cardiomediastinal shift. Moderate 3 vessel coronary atherosclerosis. Thoracic aortic atherosclerosis. LYMPH NODES: Small mediastinal and right hilar nodes, including the 13 mm short axis right paratracheal node, grossly unchanged. LUNGS AND PLEURA: 16.4 cm right upper lobe mass, corresponding to the patient's known primary bronchogenic carcinoma, previously 13.8 cm. No focal consolidation or pulmonary edema.  No evidence of pleural effusion or pneumothorax. UPPER ABDOMEN: Status post cholecystectomy. 6.2 cm right adrenal mass (image 151) previously 4.7 cm. SOFT TISSUES AND BONES: Mild erosion of the right lateral 3rd through 5th ribs, reflecting osseous invasion. No acute soft tissue abnormality. IMPRESSION: 1. No pulmonary embolism. 2. Enlarging 16.4 cm right upper lobe mass, reflecting primary bronchogenic carcinoma. 3. Associated small thoracic nodal metastases, grossly unchanged. 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 4. Osseous invasion of the right lateral 3rd-5th ribs, similar to prior. Electronically signed by: Pinkie Pebbles MD 08/12/2024 08:40 PM EST RP Workstation: [id number]   DG Chest Port 1 View Result Date: 08/12/2024 EXAM: 1 VIEW(S) XRAY OF THE CHEST 08/12/2024 07:37:00 PM COMPARISON: PET CT dated 07/12/2024. CLINICAL HISTORY: fatigue fatigue fatigue FINDINGS: LUNGS AND PLEURA: Large right upper lobe mass, compatible with primary bronchogenic carcinoma. Coarse interstitial markings and scarring in the bilateral mid and lower lungs. No pleural effusion. No pneumothorax. HEART AND MEDIASTINUM: No acute abnormality of the cardiac and mediastinal silhouettes. BONES AND SOFT TISSUES: No acute osseous abnormality. IMPRESSION: 1. Large right upper lobe mass, compatible with primary bronchogenic carcinoma. Electronically signed by: Pinkie Pebbles MD 08/12/2024 07:45 PM EST RP Workstation: [id number]     Medical Consultants:   None.   Subjective:    Jerel JONETTA Feathers awake today has no new complaints tolerating his diet this morning.  Objective:    Vitals:   08/14/24 0732 08/14/24 0800 08/14/24 0816 08/14/24 0816  BP:  127/70  127/70  Pulse:  74  74  Resp:    (!) 22  Temp: 98 F (36.7 C)  97.8 F (36.6 C) 97.8 F (36.6 C)  TempSrc: Axillary  Axillary Axillary  SpO2:  100%  100%   SpO2: 100 %  O2 Flow Rate (L/min): 2 L/min  No intake or output data in the 24 hours ending  08/14/24 0820  There were no vitals filed for this visit.  Exam: General exam: In no acute distress. Respiratory system: Good air movement and clear to auscultation. Cardiovascular system: S1 & S2 heard, RRR. No JVD. Gastrointestinal system: Abdomen is nondistended, soft and nontender.  Extremities: No pedal edema. Skin: No rashes, lesions or ulcers Psychiatry: Judgement and insight appear normal. Mood & affect appropriate.  Data Reviewed:    Labs: Basic Metabolic Panel: Recent Labs  Lab 08/12/24 1906 08/12/24 1912 08/13/24 0154 08/14/24 0333  NA 137 139  139 136 135  K 4.9 4.7  4.8 5.6* 5.2*  CL 101 102 99 96*  CO2 25  --  25 24  GLUCOSE 101* 101* 129* 133*  BUN 43* 41* 45* 72*  CREATININE 1.43* 1.70* 1.56* 2.16*  CALCIUM  10.6*  --  10.3 10.1  MG 2.4  --   --   --    GFR Estimated Creatinine Clearance: 28 mL/min (A) (by C-G formula based on SCr of 2.16 mg/dL (H)). Liver Function Tests: Recent Labs  Lab 08/12/24 1906  AST 19  ALT 6  ALKPHOS 121  BILITOT 0.4  PROT 7.0  ALBUMIN 2.6*   No results for input(s): LIPASE, AMYLASE in the last 168 hours. No results for input(s): AMMONIA in the last 168 hours. Coagulation profile No results for input(s): INR, PROTIME in the last 168 hours. COVID-19 Labs  Recent Labs    08/13/24 0154  FERRITIN 1,307*    Lab Results  Component Value Date   SARSCOV2NAA NEGATIVE 08/14/2024    CBC: Recent Labs  Lab 08/12/24 1906 08/12/24 1912 08/13/24 0154  WBC 16.5*  --  15.4*  NEUTROABS 14.5*  --   --   HGB 9.8* 10.2*  9.9* 10.2*  HCT 32.1* 30.0*  29.0* 33.8*  MCV 96.1  --  95.2  PLT 342  --  360   Cardiac Enzymes: No results for input(s): CKTOTAL, CKMB, CKMBINDEX, TROPONINI in the last 168 hours. BNP (last 3 results) Recent Labs    08/12/24 1906  PROBNP 5,355.0*   CBG: No results for input(s): GLUCAP in the last 168 hours. D-Dimer: No results for input(s): DDIMER in the last 72  hours. Hgb A1c: No results for input(s): HGBA1C in the last 72 hours. Lipid Profile: No results for input(s): CHOL, HDL, LDLCALC, TRIG, CHOLHDL, LDLDIRECT in the last 72 hours. Thyroid  function studies: No results for input(s): TSH, T4TOTAL, T3FREE, THYROIDAB in the last 72 hours.  Invalid input(s): FREET3 Anemia work up: Recent Labs    08/13/24 0154  VITAMINB12 1,174*  FOLATE 16.9  FERRITIN 1,307*  TIBC 189*  IRON 20*  RETICCTPCT 1.9   Sepsis Labs: Recent Labs  Lab 08/12/24 1906 08/12/24 1913 08/12/24 2053 08/13/24 0154  WBC 16.5*  --   --  15.4*  LATICACIDVEN  --  2.1* 0.8  --    Microbiology Recent Results (from the past 240 hours)  Culture, blood (Routine X 2) w Reflex to ID Panel     Status: None (Preliminary result)   Collection Time: 08/13/24  1:30 AM   Specimen: BLOOD LEFT ARM  Result Value Ref Range Status   Specimen Description BLOOD LEFT ARM  Final   Special Requests   Final    BOTTLES DRAWN AEROBIC AND ANAEROBIC Blood Culture adequate volume PATIENT ON FOLLOWING CEFEPIME  2 GRAMS,VANCOMYCIN  1250 MG   Culture  Final    NO GROWTH 1 DAY Performed at  [facility], [address] [place], [place] [address]    Report Status PENDING  Incomplete  Culture, blood (Routine X 2) w Reflex to ID Panel     Status: None (Preliminary result)   Collection Time: 08/13/24  1:49 AM   Specimen: BLOOD RIGHT HAND  Result Value Ref Range Status   Specimen Description BLOOD RIGHT HAND  Final   Special Requests   Final    BOTTLES DRAWN AEROBIC ONLY Blood Culture adequate volume PATIENT ON FOLLOWING CEFEPIME  2 GRAMS,VANCOMYCIN  1250 MG   Culture   Final    NO GROWTH 1 DAY Performed at [facility], [address] [place], [place] [address]    Report Status PENDING  Incomplete  Resp panel by RT-PCR (RSV, Flu A&B, Covid) Anterior Nasal Swab     Status: None   Collection Time: 08/14/24  3:32 AM   Specimen: Anterior Nasal Swab  Result Value  Ref Range Status   SARS Coronavirus 2 by RT PCR NEGATIVE NEGATIVE Final   Influenza A by PCR NEGATIVE NEGATIVE Final   Influenza B by PCR NEGATIVE NEGATIVE Final    Comment: (NOTE) The Xpert Xpress SARS-CoV-2/FLU/RSV plus assay is intended as an aid in the diagnosis of influenza from Nasopharyngeal swab specimens and should not be used as a sole basis for treatment. Nasal washings and aspirates are unacceptable for Xpert Xpress SARS-CoV-2/FLU/RSV testing.  Fact Sheet for Patients: bloggercourse.com  Fact Sheet for Healthcare Providers: seriousbroker.it  This test is not yet approved or cleared by the [place]  FDA and has been authorized for detection and/or diagnosis of SARS-CoV-2 by FDA under an Emergency Use Authorization (EUA). This EUA will remain in effect (meaning this test can be used) for the duration of the COVID-19 declaration under Section 564(b)(1) of the Act, 21 U.S.C. section 360bbb-3(b)(1), unless the authorization is terminated or revoked.     Resp Syncytial Virus by PCR NEGATIVE NEGATIVE Final    Comment: (NOTE) Fact Sheet for Patients: bloggercourse.com  Fact Sheet for Healthcare Providers: seriousbroker.it  This test is not yet approved or cleared by the [place]  FDA and has been authorized for detection and/or diagnosis of SARS-CoV-2 by FDA under an Emergency Use Authorization (EUA). This EUA will remain in effect (meaning this test can be used) for the duration of the COVID-19 declaration under Section 564(b)(1) of the Act, 21 U.S.C. section 360bbb-3(b)(1), unless the authorization is terminated or revoked.  Performed at [facility], [address], [place], [place] [address]      Medications:    dexamethasone  (DECADRON ) injection  4 mg Intravenous Q6H   Continuous Infusions:      LOS: 2 days   Erle Odell Castor  [facility]  08/14/2024, 8:20 AM

## 2024-08-15 ENCOUNTER — Ambulatory Visit: Admitting: Radiation Oncology

## 2024-08-15 ENCOUNTER — Other Ambulatory Visit: Payer: Self-pay | Admitting: Radiation Oncology

## 2024-08-15 ENCOUNTER — Inpatient Hospital Stay (HOSPITAL_COMMUNITY)

## 2024-08-15 ENCOUNTER — Encounter: Payer: Self-pay | Admitting: *Deleted

## 2024-08-15 DIAGNOSIS — J9611 Chronic respiratory failure with hypoxia: Secondary | ICD-10-CM | POA: Diagnosis not present

## 2024-08-15 DIAGNOSIS — I5021 Acute systolic (congestive) heart failure: Secondary | ICD-10-CM | POA: Diagnosis not present

## 2024-08-15 DIAGNOSIS — E279 Disorder of adrenal gland, unspecified: Secondary | ICD-10-CM | POA: Diagnosis not present

## 2024-08-15 DIAGNOSIS — G9389 Other specified disorders of brain: Secondary | ICD-10-CM | POA: Diagnosis not present

## 2024-08-15 DIAGNOSIS — G936 Cerebral edema: Secondary | ICD-10-CM | POA: Diagnosis not present

## 2024-08-15 LAB — ECHOCARDIOGRAM COMPLETE
AR max vel: 2.09 cm2
AV Area VTI: 2.24 cm2
AV Area mean vel: 1.96 cm2
AV Mean grad: 6.5 mmHg
AV Peak grad: 12.3 mmHg
Ao pk vel: 1.76 m/s
Area-P 1/2: 3.99 cm2
MV M vel: 2.62 m/s
MV Peak grad: 27.4 mmHg
S' Lateral: 4.5 cm
Weight: 2338.64 [oz_av]

## 2024-08-15 LAB — PROTIME-INR
INR: 1.2 (ref 0.8–1.2)
Prothrombin Time: 15.8 s — ABNORMAL HIGH (ref 11.4–15.2)

## 2024-08-15 LAB — BASIC METABOLIC PANEL WITH GFR
Anion gap: 11 (ref 5–15)
BUN: 84 mg/dL — ABNORMAL HIGH (ref 8–23)
CO2: 24 mmol/L (ref 22–32)
Calcium: 9.6 mg/dL (ref 8.9–10.3)
Chloride: 101 mmol/L (ref 98–111)
Creatinine, Ser: 1.84 mg/dL — ABNORMAL HIGH (ref 0.61–1.24)
GFR, Estimated: 38 mL/min — ABNORMAL LOW
Glucose, Bld: 121 mg/dL — ABNORMAL HIGH (ref 70–99)
Potassium: 4.9 mmol/L (ref 3.5–5.1)
Sodium: 136 mmol/L (ref 135–145)

## 2024-08-15 MED ORDER — FENTANYL CITRATE (PF) 100 MCG/2ML IJ SOLN
INTRAMUSCULAR | Status: AC | PRN
  Administered 2024-08-15 (×2): 25 ug via INTRAVENOUS

## 2024-08-15 MED ORDER — DEXAMETHASONE 4 MG PO TABS: 4.0000 mg | ORAL_TABLET | Freq: Three times a day (TID) | ORAL | 1 refills | Status: DC

## 2024-08-15 MED ORDER — LIDOCAINE 1 % OPTIME INJ - NO CHARGE
10.0000 mL | Freq: Once | INTRAMUSCULAR | Status: AC
  Administered 2024-08-15: 10 mL
  Filled 2024-08-15: qty 10

## 2024-08-15 MED ORDER — MIDAZOLAM HCL (PF) 2 MG/2ML IJ SOLN
INTRAMUSCULAR | Status: AC | PRN
  Administered 2024-08-15: 1 mg via INTRAVENOUS

## 2024-08-15 MED ORDER — FENTANYL CITRATE (PF) 100 MCG/2ML IJ SOLN
INTRAMUSCULAR | Status: AC
  Filled 2024-08-15: qty 4

## 2024-08-15 MED ORDER — MIDAZOLAM HCL 2 MG/2ML IJ SOLN
INTRAMUSCULAR | Status: AC
  Filled 2024-08-15: qty 4

## 2024-08-15 NOTE — Progress Notes (Signed)
 "                        PROGRESS NOTE        PATIENT DETAILS Name: Jeffrey Norton Age: 74 y.o. Sex: male Date of Birth: [birth year] Admit Date: 08/12/2024 Admitting Physician Editha Ram, MD ERE:Tzoanmw, Bernardino, DO  Brief Summary: Patient is a 74 y.o.  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  Significant events: 1/17>> admit to [facility]  Significant studies: 1/17>> MRI brain: 3 contrast-enhancing metastatic lesions in the brain. 1/17>> CT abdomen/pelvis: Enlarging 6.1 x 4.9 cm right adrenal mass 1/17>> CTA head/neck: Moderate stenosis of left vertebral artery-less than 50% stenosis of right carotid, approximately 50% stenosis of left carotid artery. 1/17>> CTA chest: No PE, enlarging 16.4 cm right upper lobe mass.  Significant microbiology data: 1/18>> blood culture: No growth 1/19>> COVID/influenza/RSV PCR: Negative  Procedures: 1/20>> CT-guided right adrenal mass biopsy by IR  Consults: Neurosurgery Palliative care IR Neurosurgery  Subjective: Looks notably frail-and debilitated.  Objective: Vitals: Blood pressure (!) 123/54, pulse 82, temperature (!) 97.4 F (36.3 C), temperature source Axillary, resp. rate 19, weight 66.3 kg, SpO2 99%.   Exam: Gen Exam:Alert awake-not in any distress HEENT:atraumatic, normocephalic Chest: B/L clear to auscultation anteriorly CVS:S1S2 regular Abdomen:soft non tender, non distended Extremities:no edema Neurology: Non focal Skin: no rash  Pertinent Labs/Radiology:    Latest Ref Rng & Units 08/14/2024    9:17 AM 08/13/2024    1:54 AM 08/12/2024    7:12 PM  CBC  WBC 4.0 - 10.5 K/uL 23.9  15.4    Hemoglobin 13.0 - 17.0 g/dL 89.9  89.7  9.9    89.7   Hematocrit 39.0 - 52.0 % 32.4  33.8  29.0    30.0    Platelets 150 - 400 K/uL 337  360      Lab Results  Component Value Date   NA 136 08/15/2024   K 4.9 08/15/2024   CL 101 08/15/2024   CO2 24 08/15/2024      Assessment/Plan: Acute metabolic encephalopathy secondary to brain mets with hemorrhagic change/vasogenic edema Encephalopathy has improved after initiation of steroids Neurosurgery to provide formal recommendations once pathology is back-see below. 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 Supportive care Oxygen  as needed  COPD Not in exacerbation Continue bronchodilators  Chronic HFrEF Euvolemic  History of CAD s/p multiple PCI-most recently in 2024 No anginal symptoms Resume antiplatelets in the next day or so if no bleeding complications from adrenal biopsy today  PAF Telemetry monitoring Previously has refused anticoagulation  PAD Asymptomatic   Code status:   Code Status: Limited: Do not attempt resuscitation (DNR) -DNR-LIMITED -Do Not Intubate/DNI    DVT Prophylaxis: SCDs Start: 08/13/24 0021   Family Communication: Spouse at bedside   Disposition Plan: Status is: Inpatient Remains inpatient appropriate because: Severity of illness   Planned Discharge Destination:Home   Diet: Diet Order             Diet NPO time specified  Diet effective midnight                     Antimicrobial agents: Anti-infectives (From admission, onward)    Start     Dose/Rate Route Frequency Ordered Stop   08/13/24 2200  vancomycin  (VANCOCIN ) IVPB 1000 mg/200 mL premix  Status:  Discontinued        1,000 mg 200 mL/hr over 60 Minutes Intravenous Every  24 hours 08/13/24 0038 08/13/24 0636   08/13/24 1000  ceFEPIme  (MAXIPIME ) 2 g in sodium chloride  0.9 % 100 mL IVPB  Status:  Discontinued        2 g 200 mL/hr over 30 Minutes Intravenous Every 12 hours 08/13/24 0038 08/13/24 0636   08/13/24 0200  metroNIDAZOLE  (FLAGYL ) IVPB 500 mg  Status:  Discontinued        500 mg 100 mL/hr over 60 Minutes Intravenous Every 12 hours 08/13/24 0025 08/13/24 0636   08/12/24 2045  vancomycin  (VANCOREADY) IVPB 1250 mg/250 mL        1,250 mg 166.7 mL/hr over 90 Minutes Intravenous  Once 08/12/24 2042 08/12/24 2328   08/12/24 2045  ceFEPIme  (MAXIPIME ) 2 g in sodium chloride  0.9 % 100 mL IVPB        2 g 200 mL/hr over 30 Minutes Intravenous Once 08/12/24 2042 08/12/24 2224        MEDICATIONS: Scheduled Meds:  budesonide -glycopyrrolate -formoterol   2 puff Inhalation BID   dexamethasone   4 mg Oral Q8H   Continuous Infusions: PRN Meds:.acetaminophen  **OR** acetaminophen    I have personally reviewed following labs and imaging studies  LABORATORY DATA: CBC: Recent Labs  Lab 08/12/24 1906 08/12/24 1912 08/13/24 0154 08/14/24 0917  WBC 16.5*  --  15.4* 23.9*  NEUTROABS 14.5*  --   --   --   HGB 9.8* 10.2*  9.9* 10.2* 10.0*  HCT 32.1* 30.0*  29.0* 33.8* 32.4*  MCV 96.1  --  95.2 94.7  PLT 342  --  360 337    Basic Metabolic Panel: Recent Labs  Lab 08/12/24 1906 08/12/24 1912 08/13/24 0154 08/14/24 0333 08/15/24 0306  NA 137 139  139 136 135 136  K 4.9 4.7  4.8 5.6* 5.2* 4.9  CL 101 102 99 96* 101  CO2 25  --  25 24 24   GLUCOSE 101* 101* 129* 133* 121*  BUN 43* 41* 45* 72* 84*  CREATININE 1.43* 1.70* 1.56* 2.16* 1.84*  CALCIUM  10.6*  --  10.3 10.1 9.6  MG 2.4  --   --   --   --     GFR: Estimated Creatinine Clearance: 33.5 mL/min (A) (by C-G formula based on SCr of 1.84 mg/dL (H)).  Liver Function Tests: Recent Labs  Lab 08/12/24 1906  AST 19  ALT 6  ALKPHOS 121  BILITOT 0.4  PROT 7.0  ALBUMIN 2.6*   No results for  input(s): LIPASE, AMYLASE in the last 168 hours. No results for input(s): AMMONIA in the last 168 hours.  Coagulation Profile: Recent Labs  Lab 08/15/24 0306  INR 1.2    Cardiac Enzymes: No results for input(s): CKTOTAL, CKMB, CKMBINDEX, TROPONINI in the last 168 hours.  BNP (last 3 results) Recent Labs    08/12/24 1906  PROBNP 5,355.0*    Lipid Profile: No results for input(s): CHOL, HDL, LDLCALC, TRIG, CHOLHDL, LDLDIRECT in the last 72 hours.  Thyroid  Function Tests: No results for input(s): TSH, T4TOTAL, FREET4, T3FREE, THYROIDAB in the last 72 hours.  Anemia Panel: Recent Labs  08/13/24 0154  VITAMINB12 1,174*  FOLATE 16.9  FERRITIN 1,307*  TIBC 189*  IRON 20*  RETICCTPCT 1.9    Urine analysis:    Component Value Date/Time   COLORURINE YELLOW 06/13/2007 1324   APPEARANCEUR CLEAR 06/13/2007 1324   LABSPEC 1.022 06/13/2007 1324   PHURINE 5.0 06/13/2007 1324   GLUCOSEU NEGATIVE 06/13/2007 1324   HGBUR NEGATIVE 06/13/2007 1324   BILIRUBINUR NEGATIVE 06/13/2007 1324   KETONESUR TRACE (A) 06/13/2007 1324   PROTEINUR NEGATIVE 06/13/2007 1324   UROBILINOGEN 0.2 06/13/2007 1324   NITRITE NEGATIVE 06/13/2007 1324   LEUKOCYTESUR SMALL (A) 06/13/2007 1324    Sepsis Labs: Lactic Acid, Venous    Component Value Date/Time   LATICACIDVEN 0.8 08/12/2024 2053    MICROBIOLOGY: Recent Results (from the past 240 hours)  Culture, blood (Routine X 2) w Reflex to ID Panel     Status: None (Preliminary result)   Collection Time: 08/13/24  1:30 AM   Specimen: BLOOD LEFT ARM  Result Value Ref Range Status   Specimen Description BLOOD LEFT ARM  Final   Special Requests   Final    BOTTLES DRAWN AEROBIC AND ANAEROBIC Blood Culture adequate volume PATIENT ON FOLLOWING CEFEPIME  2 GRAMS,VANCOMYCIN  1250 MG   Culture   Final    NO GROWTH 2 DAYS Performed at [facility], [address] [place], [place] [address]    Report Status  PENDING  Incomplete  Culture, blood (Routine X 2) w Reflex to ID Panel     Status: None (Preliminary result)   Collection Time: 08/13/24  1:49 AM   Specimen: BLOOD RIGHT HAND  Result Value Ref Range Status   Specimen Description BLOOD RIGHT HAND  Final   Special Requests   Final    BOTTLES DRAWN AEROBIC ONLY Blood Culture adequate volume PATIENT ON FOLLOWING CEFEPIME  2 GRAMS,VANCOMYCIN  1250 MG   Culture   Final    NO GROWTH 2 DAYS Performed at [facility], [address] [place], [place] [address]    Report Status PENDING  Incomplete  Resp panel by RT-PCR (RSV, Flu A&B, Covid) Anterior Nasal Swab     Status: None   Collection Time: 08/14/24  3:32 AM   Specimen: Anterior Nasal Swab  Result Value Ref Range Status   SARS Coronavirus 2 by RT PCR NEGATIVE NEGATIVE Final   Influenza A by PCR NEGATIVE NEGATIVE Final   Influenza B by PCR NEGATIVE NEGATIVE Final    Comment: (NOTE) The Xpert Xpress SARS-CoV-2/FLU/RSV plus assay is intended as an aid in the diagnosis of influenza from Nasopharyngeal swab specimens and should not be used as a sole basis for treatment. Nasal washings and aspirates are unacceptable for Xpert Xpress SARS-CoV-2/FLU/RSV testing.  Fact Sheet for Patients: bloggercourse.com  Fact Sheet for Healthcare Providers: seriousbroker.it  This test is not yet approved or cleared by the [place]  FDA and has been authorized for detection and/or diagnosis of SARS-CoV-2 by FDA under an Emergency Use Authorization (EUA). This EUA will remain in effect (meaning this test can be used) for the duration of the COVID-19 declaration under Section 564(b)(1) of the Act, 21 U.S.C. section 360bbb-3(b)(1), unless the authorization is terminated or revoked.     Resp Syncytial Virus by PCR NEGATIVE NEGATIVE Final    Comment: (NOTE) Fact Sheet for Patients: bloggercourse.com  Fact Sheet for  Healthcare Providers: seriousbroker.it  This test is not yet approved or cleared by the [place]  FDA and has been authorized for detection and/or  diagnosis of SARS-CoV-2 by FDA under an Emergency Use Authorization (EUA). This EUA will remain in effect (meaning this test can be used) for the duration of the COVID-19 declaration under Section 564(b)(1) of the Act, 21 U.S.C. section 360bbb-3(b)(1), unless the authorization is terminated or revoked.  Performed at [facility], [address] [place], [place] [address]     RADIOLOGY STUDIES/RESULTS: No results found.   LOS: 3 days   Donalda Applebaum, MD  [facility]    To contact the attending provider between 7A-7P or the covering provider during after hours 7P-7A, please log into the web site [email or web address] and access using universal Center password for that web site. If you do not have the password, please call the hospital operator.  08/15/2024, 10:08 AM    "

## 2024-08-15 NOTE — Consult Note (Cosign Needed)
 [facility] CONSULT NOTE  Patient Care Team: Dayna Motto, DO as PCP - General (Family Medicine) Court Dorn PARAS, MD as PCP - Cardiology (Cardiology) Sheffield, Andrez SAUNDERS, PA-C (Inactive) as Physician Assistant (Dermatology) Timmy Maude SAUNDERS, MD as Medical Oncologist (Oncology) Randine Warren BRAVO, RN as Oncology Nurse Navigator  CHIEF COMPLAINTS/PURPOSE OF CONSULTATION:  Lung mass with brain mets  REFERRING PHYSICIAN: Dr. Raenelle  HISTORY OF PRESENTING ILLNESS:  Jeffrey Norton 74 y.o. male who presented on 08/12/2024 with complaints of altered mental status, aphasia, generalized weakness. Workup was done including imaging which showed lung mass with brain mets.  Therefore oncology evaluation has been requested. Patient is seen awake and alert although chronically ill-appearing, laying in bed with echocardiogram in progress.  Most of history is being taken from patient's wife and daughter at bedside.  Patient's wife explains that he goes to the corner store which is 1/8 of a mile from their house every day.  However he went on Saturday afternoon and employees there noted he was shaky and almost fell.  Wife was called and patient subsequently brought for further evaluation.  Patient's wife admits that she has noticed some altered mentation like a blank stare and he does not remember something he previously said.  This has been progressively worsening over a 1 week timeframe.  Admits to poor appetite with approximately 20 pound weight loss since October 2025.  Patient himself states that he has had a cough since 2021 when he had COVID.  Patient's wife interjected that patient has thought that his cough was related to having COVID back then. Medical history is significant for CAD, MI, A-fib, hypertension, hyperlipidemia, CKD, hypoxic respiratory failure for which she is on 2 L of home oxygen , and PVD.  Also has had UTI recently and has been on antibiotics for several months per  patient's wife. Of note, patient was seen in the ER 05/25/2024 complaining of hemoptysis.  Imaging at that time showed a mass RUL and patient was referred to pulmonary for which she had a PET scan in December 2025.  Patient was being set up for bronchoscopy and cardiac evaluation when he presented to the ED this admission. Surgical history includes cholecystectomy and cardiac cath with stents placed a year and a half ago. Family oncologic history significant for father with cancer, does not remember what type however states he had cancer in the lymph nodes under his arm. Social history significant for tobacco use, 1 pack/day, quit 25 years ago.  Denies alcohol use or recreational drug use.  Formerly worked as a therapist, nutritional, denies occupational hazardous materials exposure.   I have reviewed his chart and materials related to his cancer extensively and collaborated history with the patient. Summary of oncologic history is as follows: Oncology History   No problem history exists.    ASSESSMENT & PLAN:  Altered mental status Generalized weakness Poor appetite - Patient has had significant decline in mental status in the last 3 to 4 days per family.  However family states that today he has been much more alert and conversant than he was all weekend. - Likely secondary to malignancy - Continue supportive care  Right upper lobe mass and Right adrenal mass with nodal mets, bone mets and Brain mets - Imaging done 08/12/2024 shows enlarging RUL mass, reflecting primary bronchogenic carcinoma.  Also shows right adrenal mass.  There is small thoracic nodal mets and bone mets right lateral 3rd-5th ribs. - There are 3 hyperdense  masses within the brain.   - Right adrenal mass biopsy done 08/15/2024, pending pathology. - On steroids, continue as ordered - Appreciate radiation oncology evaluation, consideration for SRS. - Neurosurgery following - Of note, patient's wife and daughter are  unsure if they want to proceed with any form of chemotherapy or immunotherapy if recommended.  States that they do want to proceed with radiation therapy.  They will discuss with family and medical oncology team. - Medical oncology/Dr. Timmy will make further evaluation and treatment recommendations.  Leukocytosis History of UTI - WBC elevated 23.9 - Appears to have elevated WBC going back 1 year - Wife states she is concerned that he has been on antibiotics long-term - Continue to monitor CBC with differential  Anemia - Hemoglobin 10.0 - No transfusional intervention required at this time - Continue to monitor CBC with differential  History of CKD - Elevated BUN and creatinine with decreased GFR - Avoid nephrotoxic agents - Continue to monitor renal function  History of elevated PSA - Noted plan for MRI prostate as an outpatient     MEDICAL HISTORY:  Past Medical History:  Diagnosis Date   Coronary artery disease    Hyperlipidemia    Hypertension    Myocardial infarction (HCC)    PVD (peripheral vascular disease)    S/P angioplasty with stent to Lt. iliac 08/15/12 08/15/2012    SURGICAL HISTORY: Past Surgical History:  Procedure Laterality Date   2D Echocardiogram  08/25/2004   EF >55%, LA-moderately dilated.   BIOPSY  03/05/2023   Procedure: BIOPSY;  Surgeon: Rollin Dover, MD;  Location: [facility] ENDOSCOPY;  Service: Gastroenterology;;   CARDIAC CATHETERIZATION  03/11/2002   RCA subtotally occluded with TIMI-1 to1/2 flow stented with a 3x23 Cypher DES resulting in reduction of occlusion to 0% with TIMI-3 flow   CORONARY BALLOON ANGIOPLASTY N/A 02/22/2023   Procedure: CORONARY BALLOON ANGIOPLASTY;  Surgeon: Dann Candyce RAMAN, MD;  Location: [facility] INVASIVE CV LAB;  Service: Cardiovascular;  Laterality: N/A;   CORONARY THROMBECTOMY N/A 02/22/2023   Procedure: Coronary Thrombectomy;  Surgeon: Dann Candyce RAMAN, MD;  Location: [facility] INVASIVE CV LAB;  Service: Cardiovascular;   Laterality: N/A;   ESOPHAGOGASTRODUODENOSCOPY (EGD) WITH PROPOFOL  N/A 03/05/2023   Procedure: ESOPHAGOGASTRODUODENOSCOPY (EGD) WITH PROPOFOL ;  Surgeon: Rollin Dover, MD;  Location: [facility] ENDOSCOPY;  Service: Gastroenterology;  Laterality: N/A;   LEA Doppler  08/29/2012   L SFA Proximal: 70-99% diameter reduction. Velocities suggest upper end of range-new finding when compared to previous study. L EIA stent: opne and patent without evidence of restenosis   LEFT HEART CATH AND CORONARY ANGIOGRAPHY N/A 07/07/2022   Procedure: LEFT HEART CATH AND CORONARY ANGIOGRAPHY;  Surgeon: Jordan, Emmalou Hunger M, MD;  Location: [facility] INVASIVE CV LAB;  Service: Cardiovascular;  Laterality: N/A;   LEFT HEART CATH AND CORONARY ANGIOGRAPHY N/A 02/22/2023   Procedure: LEFT HEART CATH AND CORONARY ANGIOGRAPHY;  Surgeon: Dann Candyce RAMAN, MD;  Location: [facility] INVASIVE CV LAB;  Service: Cardiovascular;  Laterality: N/A;   Lexiscan  Myoview  07/19/2012   No significant ECG changes, mild inferobasal hypokinesis with normal LV function, low risk stress nuclear study   PV Intervention  08/15/2012   Occluded L External Iliac-stented with a 10x51mm Abbott Nitinol Absolute Pro self-expanding stent. 75% stenosis in L Common Femoral artery stented witha 8x53mm lon Absolute Nitinol Absolute Po self-expanding stent.    SOCIAL HISTORY: Social History   Socioeconomic History   Marital status: Married    Spouse name: Not on file   Number  of children: Not on file   Years of education: Not on file   Highest education level: Not on file  Occupational History   Not on file  Tobacco Use   Smoking status: Former    Current packs/day: 0.00    Types: Cigarettes    Quit date: 08/16/1998    Years since quitting: 26.0   Smokeless tobacco: Never  Vaping Use   Vaping status: Never Used  Substance and Sexual Activity   Alcohol use: No   Drug use: No   Sexual activity: Not on file  Other Topics Concern   Not on file  Social History Narrative    Not on file   Social Drivers of Health   Tobacco Use: Medium Risk (08/13/2024)   Patient History    Smoking Tobacco Use: Former    Smokeless Tobacco Use: Never    Passive Exposure: Not on Actuary Strain: Not on file  Food Insecurity: No Food Insecurity (08/14/2024)   Epic    Worried About Programme Researcher, Broadcasting/film/video in the Last Year: Never true    Ran Out of Food in the Last Year: Never true  Transportation Needs: No Transportation Needs (08/14/2024)   Epic    Lack of Transportation (Medical): No    Lack of Transportation (Non-Medical): No  Physical Activity: Not on file  Stress: Not on file  Social Connections: Moderately Isolated (08/14/2024)   Social Connection and Isolation Panel    Frequency of Communication with Friends and Family: Three times a week    Frequency of Social Gatherings with Friends and Family: Three times a week    Attends Religious Services: Never    Active Member of Clubs or Organizations: No    Attends Banker Meetings: Never    Marital Status: Married  Catering Manager Violence: Not At Risk (08/14/2024)   Epic    Fear of Current or Ex-Partner: No    Emotionally Abused: No    Physically Abused: No    Sexually Abused: No  Depression (PHQ2-9): Not on file  Alcohol Screen: Not on file  Housing: Low Risk (08/14/2024)   Epic    Unable to Pay for Housing in the Last Year: No    Number of Times Moved in the Last Year: 0    Homeless in the Last Year: No  Utilities: Not At Risk (08/14/2024)   Epic    Threatened with loss of utilities: No  Health Literacy: Not on file    FAMILY HISTORY: Family History  Problem Relation Age of Onset   Diabetes Mother    Heart disease Mother 7   Hyperlipidemia Mother    Kidney disease Mother    Heart disease Father 10   Cancer Father 36   Stroke Maternal Grandfather        Mid 64s   Lung disease Paternal Grandfather        Black Lung Disease     PHYSICAL EXAMINATION: ECOG PERFORMANCE STATUS:  3 - Symptomatic, >50% confined to bed  Vitals:   08/15/24 0925 08/15/24 1217  BP: (!) 123/54 (!) 116/57  Pulse: 82 66  Resp: 19 17  Temp:  97.9 F (36.6 C)  SpO2: 99% 96%   Filed Weights   08/15/24 0500  Weight: 146 lb 2.6 oz (66.3 kg)    GENERAL: alert, no distress and comfortable + chronically ill-appearing SKIN: + Pale skin color, texture, turgor are normal, no rashes or significant lesions EYES: normal, conjunctiva are pink  and non-injected, sclera clear OROPHARYNX: no exudate, no erythema and lips, buccal mucosa, and tongue normal  NECK: supple, thyroid  normal size, non-tender, without nodularity LYMPH: no palpable lymphadenopathy in the cervical, axillary or inguinal LUNGS: clear to auscultation and percussion with normal breathing effort HEART: regular rate & rhythm and no murmurs and no lower extremity edema ABDOMEN: abdomen soft, non-tender and normal bowel sounds MUSCULOSKELETAL: no cyanosis of digits and no clubbing  PSYCH: + Intermittent confusion NEURO: no focal motor/sensory deficits   ALLERGIES:  is allergic to heparin .  MEDICATIONS:  Current Facility-Administered Medications  Medication Dose Route Frequency Provider Last Rate Last Admin   acetaminophen  (TYLENOL ) tablet 650 mg  650 mg Oral Q6H PRN Alfornia Madison, MD       Or   acetaminophen  (TYLENOL ) suppository 650 mg  650 mg Rectal Q6H PRN Alfornia Madison, MD       budesonide -glycopyrrolate -formoterol  (BREZTRI ) 160-9-4.8 MCG/ACT inhaler 2 puff  2 puff Inhalation BID Odell Celinda Balo, MD       dexamethasone  (DECADRON ) tablet 4 mg  4 mg Oral Q8H Odell Celinda Balo, MD   4 mg at 08/14/24 2131     LABORATORY DATA:  I have reviewed the data as listed Lab Results  Component Value Date   WBC 23.9 (H) 08/14/2024   HGB 10.0 (L) 08/14/2024   HCT 32.4 (L) 08/14/2024   MCV 94.7 08/14/2024   PLT 337 08/14/2024   Recent Labs    08/12/24 1906 08/12/24 1912 08/13/24 0154 08/14/24 0333  08/15/24 0306  NA 137   < > 136 135 136  K 4.9   < > 5.6* 5.2* 4.9  CL 101   < > 99 96* 101  CO2 25  --  25 24 24   GLUCOSE 101*   < > 129* 133* 121*  BUN 43*   < > 45* 72* 84*  CREATININE 1.43*   < > 1.56* 2.16* 1.84*  CALCIUM  10.6*  --  10.3 10.1 9.6  GFRNONAA 52*  --  47* 32* 38*  PROT 7.0  --   --   --   --   ALBUMIN 2.6*  --   --   --   --   AST 19  --   --   --   --   ALT 6  --   --   --   --   ALKPHOS 121  --   --   --   --   BILITOT 0.4  --   --   --   --    < > = values in this interval not displayed.    RADIOGRAPHIC STUDIES: I have personally reviewed the radiological images as listed and agreed with the findings in the report. CT ABDOMINAL MASS BIOPSY Result Date: 08/15/2024 INDICATION: Large right upper lobe mass compatible with malignancy, right adrenal metastasis by imaging as well EXAM: CT-GUIDED BIOPSY RIGHT ADRENAL METASTASIS MEDICATIONS: 1% LIDOCAINE  LOCAL ANESTHESIA/SEDATION: 1.0 mg IV Versed ; 50 mcg IV Fentanyl  Moderate Sedation Time:  10 MINUTE The patient was continuously monitored during the procedure by the interventional radiology nurse under my direct supervision. PROCEDURE: The procedure, risks, benefits, and alternatives were explained to the patient. Questions regarding the procedure were encouraged and answered. The patient understands and consents to the procedure. Previous imaging reviewed. Patient position right side down oblique decubitus. Noncontrast localization CT performed. The large right adrenal metastasis was localized and marked for biopsy via a posterior paraspinous approach. Under sterile conditions and local  anesthesia, the 17 gauge 6.8 cm guide needle was advanced from a posterior approach to the lesion. Needle position confirmed along the posterior margin of the adrenal metastasis. 18 gauge core biopsies obtained. These were placed in formalin. Needle removed. Postprocedure imaging demonstrates trace hemorrhage but no large hematoma. Patient  tolerated the procedure well without complication. Vital sign monitoring by nursing staff during the procedure will continue as patient is in the special procedures unit for post procedure observation. FINDINGS: The images document guide needle placement within the right adrenal metastasis. Post biopsy images demonstrate trace surrounding hemorrhage. COMPLICATIONS: None immediate. IMPRESSION: Successful CT-guided core biopsy of the right adrenal metastasis RADIATION DOSE REDUCTION: This exam was performed according to the departmental dose-optimization program which includes automated exposure control, adjustment of the mA and/or kV according to patient size and/or use of iterative reconstruction technique. Electronically Signed   By: CHRISTELLA.  Shick M.D.   On: 08/15/2024 11:20   MR Brain W and Wo Contrast Result Date: 08/12/2024 EXAM: MRI BRAIN WITH AND WITHOUT CONTRAST 08/12/2024 11:12:35 PM TECHNIQUE: Multiplanar multisequence MRI of the head/brain was performed with and without the administration of intravenous contrast. 7 mL of gadobutrol  (GADAVIST ) 1 MMOL/ML injection was administered. COMPARISON: None available. CLINICAL HISTORY: Metastatic disease evaluation. FINDINGS: BRAIN AND VENTRICLES: No acute infarct. There are 3 contrast-enhancing metastatic lesions within the brain: 1. Left frontal lobe 3.2 x 3.0 cm with surrounding moderate vasogenic edema, heterogeneous internal signal with hemorrhagic changes. 2. Parasagittal right occipital lobe 1.7 x 1.4 cm with mild surrounding vasogenic edema and internal hemorrhagic change. 3. Superior right frontal lobe 7 mm with mild surrounding edema and evidence of prior hemorrhage. There is mild mass effect on the left lateral ventricle but no midline shift or herniation. No hydrocephalus. The sella is unremarkable. Normal flow voids. ORBITS: No significant abnormality. SINUSES: No significant abnormality. BONES AND SOFT TISSUES: Normal bone marrow signal. No soft tissue  abnormality. IMPRESSION: 1. Three contrast-enhancing intracranial metastatic lesions with associated hemorrhagic change and vasogenic edema, largest in the left frontal lobe (3.2 x 3.0 cm), with additional lesions in the parasagittal right occipital lobe (1.7 x 1.4 cm) and superior right frontal lobe (7 mm). 2. Mild mass effect on the left lateral ventricle without midline shift or herniation. Electronically signed by: Franky Stanford MD 08/12/2024 11:26 PM EST RP Workstation: [id number]   CT ABDOMEN PELVIS W CONTRAST Result Date: 08/12/2024 EXAM: CT ABDOMEN AND PELVIS WITH CONTRAST 08/12/2024 10:42:24 PM TECHNIQUE: CT of the abdomen and pelvis was performed with the administration of 75 mL of iohexol  (OMNIPAQUE ) 350 MG/ML injection. Multiplanar reformatted images are provided for review. Automated exposure control, iterative reconstruction, and/or weight-based adjustment of the mA/kV was utilized to reduce the radiation dose to as low as reasonably achievable. COMPARISON: CT abdomen and pelvis 10/24/2006. CLINICAL HISTORY: Metastatic disease evaluation. FINDINGS: LOWER CHEST: There are increasing fibrotic changes in the lung bases. LIVER: The liver is unremarkable. GALLBLADDER AND BILE DUCTS: The gallbladder is surgically absent. No biliary ductal dilatation. SPLEEN: No acute abnormality. PANCREAS: No acute abnormality. ADRENAL GLANDS: There is an enlarging 6.1 x 4.9 cm right adrenal mass arg . Left adrenal gland within normal limits. KIDNEYS, URETERS AND BLADDER: There is a punctate calculus in the inferior pole of the right kidney. There are bilateral renal cysts measuring up to 2.8 cm on the right and 3.5 cm on the left. Per consensus, no follow-up is needed for simple Bosniak type 1 and 2 renal cysts, unless the patient has a  malignancy history or risk factors. No hydronephrosis. No perinephric or periureteral stranding. Urinary bladder is unremarkable. GI AND BOWEL: Stomach demonstrates no acute abnormality.  Sigmoid and descending colon diverticulosis is present. The appendix appears normal. There is no bowel obstruction. PERITONEUM AND RETROPERITONEUM: No ascites. No free air. VASCULATURE: Aorta is normal in caliber. Severe atherosclerotic disease throughout the aorta. 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 LYMPH NODES: No lymphadenopathy. REPRODUCTIVE ORGANS: There are calcifications in the prostate gland. BONES AND SOFT TISSUES: No acute osseous abnormality. No focal soft tissue abnormality. IMPRESSION: 1. No acute findings. 2. Enlarging 6.1 x 4.9 cm right adrenal mass. 3. 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 Electronically signed by: Greig Pique MD 08/12/2024 11:05 PM EST RP Workstation: [id number]   CT ANGIO HEAD NECK W WO CM Result Date: 08/12/2024 EXAM: CTA Head and Neck with and without Intravenous Contrast. CT Head without Contrast. CLINICAL HISTORY: Neuro deficit, acute, stroke suspected. TECHNIQUE: Axial CTA images of the head and neck performed with and without intravenous contrast. MIP reconstructed images were created and reviewed. Axial computed tomography images of the head/brain performed without intravenous contrast. Note: Per PQRS, the description of internal carotid artery percent stenosis, including 0 percent or normal exam, is based on North American Symptomatic Carotid Endarterectomy Trial (NASCET) criteria. Dose reduction technique was used including one or more of the following: automated exposure control, adjustment of mA and kV according to patient size, and/or iterative reconstruction. CONTRAST: With and without; 75 mL iohexol  (OMNIPAQUE ) 350 MG/ML injection. COMPARISON: None provided. FINDINGS: CT HEAD: BRAIN: There are hyperdense masses within the brain. The first is at the left frontal lobe and measures 3.3 x 3.1 cm with moderate surrounding vasogenic edema. The second is at the medial right occipital lobe and measures 1.8 x 1.8  cm with moderate surrounding vasogenic edema. A third lesion at the superior right frontal lobe measures 9 mm with mild surrounding vasogenic edema. No CT evidence for acute territorial infarct. No midline shift or extra-axial collection. VENTRICLES: No hydrocephalus. ORBITS: The orbits are unremarkable. SINUSES AND MASTOIDS: The paranasal sinuses and mastoid air cells are clear. CTA NECK: COMMON CAROTID ARTERIES: There is mixed density atherosclerotic disease at both carotid bifurcations. On the right, this results in less than 50% stenosis. On the left, it results in approximately 50% stenosis. No dissection or occlusion. INTERNAL CAROTID ARTERIES: Both internal carotid arteries remain widely patent to the skull base. No stenosis by NASCET criteria. No dissection or occlusion. VERTEBRAL ARTERIES: Moderate stenosis at the origin of the left vertebral artery which is otherwise widely patent with mild multifocal atherosclerotic calcification. No dissection or occlusion. CTA HEAD: ANTERIOR CEREBRAL ARTERIES: No significant stenosis. No occlusion. No aneurysm. MIDDLE CEREBRAL ARTERIES: No significant stenosis. No occlusion. No aneurysm. POSTERIOR CEREBRAL ARTERIES: Fetal origin of the right PCA. No significant stenosis. No occlusion. No aneurysm. BASILAR ARTERY: No significant stenosis. No occlusion. No aneurysm. OTHER: There is mild atherosclerotic calcification of the carotid siphons. SOFT TISSUES: No acute finding. No masses or lymphadenopathy. BONES: No acute osseous abnormality. LUNGS: Complete collapse of the visible portion of the right lung with a large amount of right-sided pleural effusion incompletely visualized. IMPRESSION: 1. Hyperdense masses within the brain, including a 3.3 x 3.1 cm left frontal lobe lesion, a 1.8 x 1.8 cm medial right occipital lobe lesion, and a 9 mm superior right frontal lobe lesion, with associated vasogenic edema. This is likely metastatic disease and MRI with and without  contrast  is recommended for further evaluation. 2. Moderate stenosis at the origin of the left vertebral artery, which is otherwise widely patent with mild multifocal atherosclerotic calcification. 3. Mixed density atherosclerotic disease at both carotid bifurcations, resulting in less than 50% stenosis on the right and approximately 50% stenosis on the left, with both internal carotid arteries widely patent to the skull base. 4. Complete collapse of the visible portion of the right lung with a large amount of right-sided pleural effusion, incompletely visualized. Electronically signed by: Franky Stanford MD 08/12/2024 08:43 PM EST RP Workstation: [id number]   CT Angio Chest PE W and/or Wo Contrast Result Date: 08/12/2024 EXAM: CTA CHEST 08/12/2024 08:14:23 PM TECHNIQUE: CTA of the chest was performed without and with intravenous contrast. 75 mL of iohexol  (OMNIPAQUE ) 350 MG/ML injection was administered. Multiplanar reformatted images are provided for review. MIP images are provided for review. Automated exposure control, iterative reconstruction, and/or weight based adjustment of the mA/kV was utilized to reduce the radiation dose to as low as reasonably achievable. COMPARISON: PET CT dated 07/12/2024. CLINICAL HISTORY: Pulmonary embolism (PE) suspected, high prob. FINDINGS: PULMONARY ARTERIES: Pulmonary arteries are adequately opacified for evaluation. No acute pulmonary embolus. Main pulmonary artery is normal in caliber. MEDIASTINUM: Leftward cardiomediastinal shift. Moderate 3 vessel coronary atherosclerosis. Thoracic aortic atherosclerosis. LYMPH NODES: Small mediastinal and right hilar nodes, including the 13 mm short axis right paratracheal node, grossly unchanged. LUNGS AND PLEURA: 16.4 cm right upper lobe mass, corresponding to the patient's known primary bronchogenic carcinoma, previously 13.8 cm. No focal consolidation or pulmonary edema. No evidence of pleural effusion or pneumothorax. UPPER ABDOMEN: Status  post cholecystectomy. 6.2 cm right adrenal mass (image 151) previously 4.7 cm. SOFT TISSUES AND BONES: Mild erosion of the right lateral 3rd through 5th ribs, reflecting osseous invasion. No acute soft tissue abnormality. IMPRESSION: 1. No pulmonary embolism. 2. Enlarging 16.4 cm right upper lobe mass, reflecting primary bronchogenic carcinoma. 3. Associated small thoracic nodal metastases, grossly unchanged. 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 4. Osseous invasion of the right lateral 3rd-5th ribs, similar to prior. Electronically signed by: Pinkie Pebbles MD 08/12/2024 08:40 PM EST RP Workstation: [id number]   DG Chest Port 1 View Result Date: 08/12/2024 EXAM: 1 VIEW(S) XRAY OF THE CHEST 08/12/2024 07:37:00 PM COMPARISON: PET CT dated 07/12/2024. CLINICAL HISTORY: fatigue fatigue fatigue FINDINGS: LUNGS AND PLEURA: Large right upper lobe mass, compatible with primary bronchogenic carcinoma. Coarse interstitial markings and scarring in the bilateral mid and lower lungs. No pleural effusion. No pneumothorax. HEART AND MEDIASTINUM: No acute abnormality of the cardiac and mediastinal silhouettes. BONES AND SOFT TISSUES: No acute osseous abnormality. IMPRESSION: 1. Large right upper lobe mass, compatible with primary bronchogenic carcinoma. Electronically signed by: Pinkie Pebbles MD 08/12/2024 07:45 PM EST RP Workstation: [id number]     I personally spent a total of 55 minutes minutes in the care of the patient today including preparing to see the patient, getting/reviewing separately obtained history, performing a medically appropriate exam/evaluation, referring and communicating with other health care professionals, documenting clinical information in the EHR, communicating results, and coordinating care.    All questions were answered. The patient knows to call the clinic with any problems, questions or concerns. No barriers to learning was detected.  Olam PARAS Rouson, NP 1/20/202612:44 PM     ADDENDUM: Unfortunately, the patient was down getting his MRI.  I have not had a chance to meet him.  I talked with his wife.  We reviewed that CT scan in the MRI.  This is going to be a very difficult problem.  I really think the best option that we can have here is if this is going to be small cell lung cancer.  This would be the easiest cancer to treat.  I told his wife that no matter what, we are not going to cure this.  Our goal clearly is quality of life.  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  Ultimately, what is in the right lung which is massive, will be a problem.  I know that Radiation Oncology has seen him.  I know that Neurosurgery is seeing him.  I would think that he might be moved over to [facility] for radiation.  I will see if Pathology might be able to give me a  preliminary on his adrenal biopsy.  Again, I told his wife that if this is not small cell lung cancer, we may be in a tough way.  Again he would do well with radiation to the brain.  Again this is with good dictate his quality of life right now.  He has lost weight.  I will have to check a prealbumin on him to see what his level is.  His wife understands very well that this is a very bad situation.  It is certainly conceivable that we may not be able to do anything systemically for him.  The pathology is going to be critical in this situation.  Again I would think that he would be moved over to [facility] for radiotherapy.  Jeralyn Crease, MD  Ila 41:10

## 2024-08-15 NOTE — Progress Notes (Signed)
 Patient confirmed to me with his wife on the phone that he would like DNR maintained during IR procedure today.

## 2024-08-15 NOTE — Progress Notes (Signed)
 Patient admitted over the weekend for mental status changes. MRI shows multiple brain mets. Scheduled biopsy cancelled, but patient will have biopsy done today while inpatient. Palliative care consulting while inpatient. Dr Timmy is aware of admission.   Will follow for path, outpatient needs and office follow up.   Oncology Nurse Navigator Documentation     08/15/2024    7:45 AM  Oncology Nurse Navigator Flowsheets  Navigator Follow Up Date: 08/18/2024  Navigator Follow Up Reason: Appointment Review  Navigator Location [facility]  Navigator Encounter Type Appt/Treatment Plan Review  Patient Visit Type MedOnc  Treatment Phase Abnormal Scans  Barriers/Navigation Needs Coordination of Care;Education  Interventions None Required  Acuity Level 2-Minimal Needs (1-2 Barriers Identified)  Support Groups/Services Friends and Family  Time Spent with Patient 15

## 2024-08-15 NOTE — Procedures (Signed)
 Interventional Radiology Procedure Note  Procedure: CT CORE BX RT ADRENAL MET     Complications: None  Estimated Blood Loss:  MIN  Findings: 26 G CORES IN FORMALIN     M. TREVOR Consandra Laske, MD

## 2024-08-15 NOTE — Plan of Care (Signed)
  Problem: Education: Goal: Knowledge of General Education information will improve Description: Including pain rating scale, medication(s)/side effects and non-pharmacologic comfort measures Outcome: Progressing   Problem: Clinical Measurements: Goal: Will remain free from infection Outcome: Progressing Goal: Diagnostic test results will improve Outcome: Progressing   Problem: Activity: Goal: Risk for activity intolerance will decrease Outcome: Progressing   Problem: Coping: Goal: Level of anxiety will decrease Outcome: Progressing   

## 2024-08-15 NOTE — Progress Notes (Signed)
" °  NEUROSURGERY PROGRESS NOTE   No issues overnight. Pts history reviewed with pt and wife at bedside, as well as in EMR.  To review, patient has an extensive medical history including coronary artery disease status post multiple stent angioplasty, A-fib, heart failure, stage III chronic kidney disease, and hypoxic respiratory failure on 2 L home oxygen .  He is admitted a few days ago with onset of confusion and CT scan demonstrating primarily left frontal edema.  Subsequent MRI has revealed a ring-enhancing lesion within the left frontal lobe, as well as a smaller posterior right parietal lesion.  Patient has also been found to have multiple visceral metastases and underwent CT-guided adrenal biopsy this morning.  EXAM:  BP (!) 116/57   Pulse 66   Temp 97.9 F (36.6 C) (Oral)   Resp 17   Wt 66.3 kg   SpO2 96%   BMI 21.58 kg/m   Frail appearing, generally weak Awake, alert, oriented  Speech fluent, appropriate  CN grossly intact  5/5 BUE/BLE   IMAGING: MRI Brain with and without contrast dated 08/12/2024 was personally reviewed.  This demonstrates multiple intracranial metastases, largest within the left frontal lobe measuring approximately 3.2 cm in maximal dimension.  There is significant associated left frontal edema.  There is a small approximately 1.7 cm right occipital lesion and a sub centimeter  right frontal lesion.  There is no hydrocephalus.  IMPRESSION:  74 y.o. male   Presenting with confusion likely related to multiple intracranial metastases.  Patient also found to have multiple visceral metastases now status post adrenal biopsy for diagnosis.  Unfortunately, the patient has multiple medical comorbidities and a poor baseline functional status.  While under normal circumstances I would likely recommend surgical resection of the larger left frontal lesion, in this situation I do not feel that the patient is a good surgical candidate as he is unlikely to be able to recover  adequately from craniotomy.  I therefore think primary stereotactic radiosurgery to all three lesions is his best option.  PLAN: -   We will await results of the adrenal biopsy for diagnosis. -   I have spoken to the radiation oncology team who will see the patient in consult.  We will plan on 3-T brain MRI for  planning, but would plan on primary stereotactic radiosurgery to the intracranial lesions.    I have reviewed the situation with the patient and his wife.  We discussed general treatment options of intracranial metastases.  While surgery is an option, given his baseline status I did recommend primary stereotactic radiosurgery as the best treatment for his intracranial metastases.  All their questions today were answered.  Gerldine Maizes, MD [facility]   "

## 2024-08-15 NOTE — Progress Notes (Signed)
 "  NAME:  Jeffrey Norton, MRN:  [id number], DOB:  [birth year], LOS: 3 ADMISSION DATE:  08/12/2024, CONSULTATION DATE:  08/15/24 REFERRING MD:  Dr. Odell Castor, CHIEF COMPLAINT:  Lung mass   History of Present Illness:   [name] is a 74 year old male with a history of coronary artery disease status post multiple PCIs, paroxysmal AFib for which he previously declined anticoagulation, HFrEF, chronic kidney disease, hypertension, history of GI bleeds, and chronic hypoxic respiratory failure on two liters who presents for changes in mental status now found to have multiple brain masses in the left frontal right occipital and right frontal lobe with vasogenic edema likely due to known lung mass with adrenal metastasis. Pulmonology consulted for evaluation and management of the latter.  The patient is a 74 year old with coronary artery disease and chronic hypoxic respiratory failure who presents with changes in mental status and confusion.  He presented to the emergency department due to changes in mental status and confusion. A CT scan of the head revealed multiple brain masses in the left frontal, right occipital, and right frontal lobes with vasogenic edema. He has been on steroids for brain swelling.  He has a known lung mass in the right upper lobe, measuring 15 cm by 8 cm, identified on May 25, 2024. A PET CT on July 12, 2024, showed the mass to be hypermetabolic with mediastinal and hilar adenopathy bilaterally. He was scheduled for a biopsy of the lung mass by interventional radiology, but it was delayed. He has been off Plavix  and aspirin  for five days in preparation for the biopsy. He has experienced weight loss and has a history of hemoptysis, which initially led to the discovery of the lung mass.  He has a history of coronary artery disease with multiple percutaneous coronary interventions, paroxysmal atrial fibrillation for which he previously declined anticoagulation, heart  failure with reduced ejection fraction, pericardial effusion, chronic kidney disease, hypertension, history of gastrointestinal bleeds, and chronic hypoxic respiratory failure on two liters of oxygen . He is currently on two liters of oxygen  and has been experiencing confusion, which prompted the emergency visit.  He has a family history of lung cancer, specifically mentioning a relative named [facility]. He has a history of smoking.  Pertinent  Medical History   Past Medical History:  Diagnosis Date   Coronary artery disease    Hyperlipidemia    Hypertension    Myocardial infarction [facility])    PVD (peripheral vascular disease)    S/P angioplasty with stent to Lt. iliac 08/15/12 08/15/2012    Significant Hospital Events: Including procedures, antibiotic start and stop dates in addition to other pertinent events   1/19: Admit for brain mets with vasogenic edema 1/20: underwent IR bx of adrenal mass  Interim History / Subjective:  Patient was sleeping when I went in room but wife at bedside noted that patient is more coherent, has less headache, and is less confused. His biopsy went well overall.  Objective    Blood pressure (!) 116/57, pulse 66, temperature 97.9 F (36.6 C), temperature source Oral, resp. rate 17, weight 66.3 kg, SpO2 96%.        Intake/Output Summary (Last 24 hours) at 08/15/2024 1221 Last data filed at 08/15/2024 0330 Gross per 24 hour  Intake --  Output 600 ml  Net -600 ml   Filed Weights   08/15/24 0500  Weight: 66.3 kg    Examination: General: thin man, chronically ill appearing, cachectic HENT: anicteric  sclera, well injected conjunctivae, oral and nasal mucosa intact Lungs: decreased breath sounds in right upper lung field, wheezing improved. Cardiovascular: RRR, no r/m/g Abdomen: soft, non-tender Extremities: no edema Neuro: alert, oriented to person and place, however, intermittently confuses things or says things that are non-sensible  GU:  deferred  Radiology: Head CT: Multiple intracranial masses in the left frontal, right occipital, and right frontal lobes with associated vasogenic edema Chest CT (05/25/2024): Right upper lobe mass measuring 15 cm x 8 cm PET CT (07/12/2024): Hypermetabolic right upper lobe mass with bilateral mediastinal and hilar lymphadenopathy Brain MRI: Left frontal lobe mass 3 cm x 3 cm with moderate vasogenic edema; right occipital lobe mass 1.7 cm x 1 cm with vasogenic edema Abdominal imaging: Enlarging right adrenal mass measuring 6.1 cm x 4.9 cm  Resolved problem list   Assessment and Plan   Right upper lobe lung mass, pending malignancy diagnosis Large hypermetabolic mass in right upper lobe with mediastinal and hilar adenopathy. Biopsy pending. Discussed biopsy options and associated risks. Cardiologist clearance obtained for anesthesia. Underwent IR biopsy of adrenal gland this AM - Can resume aspirin  and plavix  - Will await pathology results, if yields a malignant diagnosis, will not need an EBUS. If negative for malignancy, will proceed with EBUS. - Will need PET/CT as an OP - Will need medical and radiation oncology follow up as an OP  Brain metastases with cerebral edema Multiple brain masses with vasogenic edema. Steroids initiated, improving symptoms. - Continue steroid therapy to manage cerebral edema. - Consult radiation oncology for consideration of whole brain radiation - Appreciate neurosurgery recommendations  Right adrenal mass, pending malignancy diagnosis Enlarged right adrenal mass. Biopsy results pending  Chronic hypoxic respiratory failure Managed with supplemental oxygen . - Continue supplemental oxygen  at 2 liters.  Patient Lines/Drains/Airways Status     Active Line/Drains/Airways     Name Placement date Placement time Site Days   Peripheral IV 08/12/24 20 G 1.88 Anterior;Proximal;Right Forearm 08/12/24  1918  Forearm  3   Wound 08/15/24 0909 Other (Comment)  Lumbar Right 08/15/24  0909  Lumbar  less than 1            Labs   CBC: Recent Labs  Lab 08/12/24 1906 08/12/24 1912 08/13/24 0154 08/14/24 0917  WBC 16.5*  --  15.4* 23.9*  NEUTROABS 14.5*  --   --   --   HGB 9.8* 10.2*  9.9* 10.2* 10.0*  HCT 32.1* 30.0*  29.0* 33.8* 32.4*  MCV 96.1  --  95.2 94.7  PLT 342  --  360 337    Basic Metabolic Panel: Recent Labs  Lab 08/12/24 1906 08/12/24 1912 08/13/24 0154 08/14/24 0333 08/15/24 0306  NA 137 139  139 136 135 136  K 4.9 4.7  4.8 5.6* 5.2* 4.9  CL 101 102 99 96* 101  CO2 25  --  25 24 24   GLUCOSE 101* 101* 129* 133* 121*  BUN 43* 41* 45* 72* 84*  CREATININE 1.43* 1.70* 1.56* 2.16* 1.84*  CALCIUM  10.6*  --  10.3 10.1 9.6  MG 2.4  --   --   --   --    GFR: Estimated Creatinine Clearance: 33.5 mL/min (A) (by C-G formula based on SCr of 1.84 mg/dL (H)). Recent Labs  Lab 08/12/24 1906 08/12/24 1913 08/12/24 2053 08/13/24 0154 08/14/24 0917  WBC 16.5*  --   --  15.4* 23.9*  LATICACIDVEN  --  2.1* 0.8  --   --  Liver Function Tests: Recent Labs  Lab 08/12/24 1906  AST 19  ALT 6  ALKPHOS 121  BILITOT 0.4  PROT 7.0  ALBUMIN 2.6*   No results for input(s): LIPASE, AMYLASE in the last 168 hours. No results for input(s): AMMONIA in the last 168 hours.  ABG    Component Value Date/Time   HCO3 27.8 08/12/2024 1912   TCO2 26 08/12/2024 1912   TCO2 29 08/12/2024 1912   O2SAT 92 08/12/2024 1912     Coagulation Profile: Recent Labs  Lab 08/15/24 0306  INR 1.2    Cardiac Enzymes: No results for input(s): CKTOTAL, CKMB, CKMBINDEX, TROPONINI in the last 168 hours.  HbA1C: Hgb A1c MFr Bld  Date/Time Value Ref Range Status  07/07/2022 07:23 PM 5.7 (H) 4.8 - 5.6 % Final    Comment:    (NOTE)         Prediabetes: 5.7 - 6.4         Diabetes: >6.4         Glycemic control for adults with diabetes: <7.0     CBG: No results for input(s): GLUCAP in the last 168 hours.  Review  of Systems:   Not obtained  Past Medical History:  He,  has a past medical history of Coronary artery disease, Hyperlipidemia, Hypertension, Myocardial infarction (HCC), PVD (peripheral vascular disease), and S/P angioplasty with stent to Lt. iliac 08/15/12 (08/15/2012).   Surgical History:   Past Surgical History:  Procedure Laterality Date   2D Echocardiogram  08/25/2004   EF >55%, LA-moderately dilated.   BIOPSY  03/05/2023   Procedure: BIOPSY;  Surgeon: Rollin Dover, MD;  Location: [facility] ENDOSCOPY;  Service: Gastroenterology;;   CARDIAC CATHETERIZATION  03/11/2002   RCA subtotally occluded with TIMI-1 to1/2 flow stented with a 3x23 Cypher DES resulting in reduction of occlusion to 0% with TIMI-3 flow   CORONARY BALLOON ANGIOPLASTY N/A 02/22/2023   Procedure: CORONARY BALLOON ANGIOPLASTY;  Surgeon: Dann Candyce RAMAN, MD;  Location: [facility] INVASIVE CV LAB;  Service: Cardiovascular;  Laterality: N/A;   CORONARY THROMBECTOMY N/A 02/22/2023   Procedure: Coronary Thrombectomy;  Surgeon: Dann Candyce RAMAN, MD;  Location: [facility] INVASIVE CV LAB;  Service: Cardiovascular;  Laterality: N/A;   ESOPHAGOGASTRODUODENOSCOPY (EGD) WITH PROPOFOL  N/A 03/05/2023   Procedure: ESOPHAGOGASTRODUODENOSCOPY (EGD) WITH PROPOFOL ;  Surgeon: Rollin Dover, MD;  Location: [facility] ENDOSCOPY;  Service: Gastroenterology;  Laterality: N/A;   LEA Doppler  08/29/2012   L SFA Proximal: 70-99% diameter reduction. Velocities suggest upper end of range-new finding when compared to previous study. L EIA stent: opne and patent without evidence of restenosis   LEFT HEART CATH AND CORONARY ANGIOGRAPHY N/A 07/07/2022   Procedure: LEFT HEART CATH AND CORONARY ANGIOGRAPHY;  Surgeon: Jordan, Peter M, MD;  Location: [facility] INVASIVE CV LAB;  Service: Cardiovascular;  Laterality: N/A;   LEFT HEART CATH AND CORONARY ANGIOGRAPHY N/A 02/22/2023   Procedure: LEFT HEART CATH AND CORONARY ANGIOGRAPHY;  Surgeon: Dann Candyce RAMAN, MD;  Location: [facility] INVASIVE CV LAB;   Service: Cardiovascular;  Laterality: N/A;   Lexiscan  Myoview  07/19/2012   No significant ECG changes, mild inferobasal hypokinesis with normal LV function, low risk stress nuclear study   PV Intervention  08/15/2012   Occluded L External Iliac-stented with a 10x63mm Abbott Nitinol Absolute Pro self-expanding stent. 75% stenosis in L Common Femoral artery stented witha 8x76mm lon Absolute Nitinol Absolute Po self-expanding stent.     Social History:   reports that he quit smoking about 26 years ago. His  smoking use included cigarettes. He has never used smokeless tobacco. He reports that he does not drink alcohol and does not use drugs.   Family History:  His family history includes Cancer (age of onset: 4) in his father; Diabetes in his mother; Heart disease (age of onset: 16) in his father; Heart disease (age of onset: 59) in his mother; Hyperlipidemia in his mother; Kidney disease in his mother; Lung disease in his paternal grandfather; Stroke in his maternal grandfather.   Allergies Allergies[1]   Home Medications  Prior to Admission medications  Medication Sig Start Date End Date Taking? Authorizing Provider  APPLE CIDER VINEGAR PO Take 1 Dose by mouth See admin instructions. 2 teaspoons honey + apple cider vinegar to thin, every 6 hours as needed for cough.   Yes [provider]  Ascorbic Acid (VITAMIN C PO) Take 1 tablet by mouth daily.   Yes [provider]  b complex vitamins capsule Take 1 capsule by mouth daily.   Yes [provider]  Coenzyme Q10 (COQ10 PO) Take 1 capsule by mouth daily.   Yes [provider]  furosemide  (LASIX ) 40 MG tablet Take 1 tablet (40 mg total) by mouth daily. As needed for fluid in legs, weight gain 03/06/23  Yes Pokhrel, Laxman, MD  guaiFENesin -codeine  100-10 MG/5ML syrup Take 5 mLs by mouth at bedtime as needed for cough. 07/26/24  Yes Hattar, Zola SAILOR, MD  MAGNESIUM PO Take 1 tablet by mouth daily.   Yes [provider]  metoprolol  succinate (TOPROL -XL) 25 MG 24 hr tablet Take 1 tablet (25 mg total) by mouth daily. 06/08/23  Yes Court Dorn PARAS, MD  Multiple Vitamins-Minerals (ZINC PO) Take 1 tablet by mouth daily.   Yes [provider]  QUERCETIN PO Take 1 tablet by mouth daily.   Yes [provider]  Vitamin D-Vitamin K (VITAMIN K2-VITAMIN D3 PO) Take 1 tablet by mouth daily.   Yes [provider]  vitamin E 180 MG (400 UNITS) capsule Take 400 Units by mouth daily.   Yes [provider]  aspirin  EC 81 MG tablet Take 81 mg by mouth every other day. Patient not taking: Reported on 08/13/2024    [provider]  budesonide -glycopyrrolate -formoterol  (BREZTRI  AEROSPHERE) 160-9-4.8 MCG/ACT AERO inhaler Inhale 2 puffs into the lungs in the morning and at bedtime. Patient not taking: Reported on 08/13/2024 07/03/24   Zaida Zola SAILOR, MD  clopidogrel  (PLAVIX ) 75 MG tablet Take 1 tablet (75 mg total) by mouth daily. Patient not taking: Reported on 08/13/2024 06/08/23   Court Dorn PARAS, MD  ipratropium-albuterol  (DUONEB) 0.5-2.5 (3) MG/3ML SOLN Take 3 mLs by nebulization every 4 (four) hours as needed. Patient not taking: Reported on 08/13/2024 07/03/24   Zaida Zola SAILOR, MD  nitroGLYCERIN  (NITROSTAT ) 0.4 MG SL tablet Place 1 tablet (0.4 mg total) under the tongue every 5 (five) minutes as needed for chest pain. Patient not taking: Reported on 08/13/2024 07/08/22   Henry Shaver B, NP  olmesartan (BENICAR) 40 MG tablet Take 40 mg by mouth daily. Patient not taking: Reported on 08/13/2024 05/26/23   [provider]     Thank you for this interesting consult. I have spent 50 minutes evaluating patient, reviewing chart, and discussing plan of care with patient, family, and primary medical team. If you have any questions or concerns please reach out to me via secure chat.  Paula Southerly, MD [facility]              [  1]   Allergies Allergen Reactions   Heparin      bleeding   "

## 2024-08-15 NOTE — Consult Note (Signed)
 " Radiation Oncology         [phone] ________________________________  Name: Jeffrey Norton        [name]  Date of Service: 08/15/24 DOB: 1951-01-14  RR:Tzoanmw, Bernardino, DO    REFERRING PHYSICIAN: Dr. Raenelle   DIAGNOSIS: The primary encounter diagnosis was Altered mental status, unspecified altered mental status type. A diagnosis of Brain lesion was also pertinent to this visit.   HISTORY OF PRESENT ILLNESS: Jeffrey Norton is a 74 y.o. male seen at the request of Dr. 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  The patient has a medical history for coronary artery disease, atrial fibrillation, heart failure, stage III chronic kidney disease and hypoxic respiratory failure on 2 L of home oxygen . He had an outpatient PSA in the 11 range that persisted as well as nodular prostate exam by Dr. Carolee and has an MRI of the prostate scheduled for 08/21/2024. He had been seen in the ER complaining of hemoptysis on 05/25/2024, he underwent a CT angio of the chest that did not show a blood clot but showed a mass in the right upper lobe suspicious for malignancy, small subpleural right upper lobe nodules measuring up to 3 mm were also noted as well as indeterminate mediastinal adenopathy.  There was an enlarging right adrenal mass measuring 3.6 cm previously this had been characterized as an adenoma but given the findings in the right lung this was concerning for possible metastatic disease.  He was referred to pulmonary and underwent a PET scan on 07/12/2024 that showed a hypermetabolic mass in the anterior and posterior segments of the right upper lobe measuring up to 14 cm with an SUV of 28.3, ipsilateral hilar and mediastinal lymph nodes were hypermetabolic including a right paratracheal lymph node measuring 1.7 cm with an SUV of 2.9, a right preperi-cardiac node measuring 1.3 with an  SUV of 3, hypermetabolism in the right adrenal gland mass measuring 4.8 cm with an SUV of 28.2, focal anorectal hypermetabolism with an SUV of 10.9 possibly corresponding to a 12 mm nodule was also noted.  He was being set up to undergo bronchoscopy and needed cardiac evaluation prior to this.  He presented however on 08/12/2024 with weakness and acute mental status changes.  Imaging in the ER of the brain by CT with angiography showed hypodense masses within the brain the largest in the left frontal lobe measuring 3.3 cm, a right medial occipital lobe lesion measuring 1.8 cm and the third involving the right frontal lobe measuring 9 mm each had evidence of vasogenic edema, further CT imaging of the chest with angiography and abdomen and pelvis films showed enlargement of the right adrenal mass now measuring 6.1 cm again no pulmonary embolism and the right upper lobe mass measuring 16.4 cm.  An MRI brain on 08/12/2024 showed the known 3 lesions notated on CT with an additional finding in the right occipital lobe measuring 1.7 cm and superior right frontal lobe measuring 7 mm, mild mass effect on the left lateral ventricle was noted but no evidence of midline shift or herniation was appreciated.  He has undergone a CT-guided biopsy of the right adrenal gland today.  We have been asked to consider radiotherapy options and neurosurgery is awaiting histology to determine recommendations as well. He is receiving Dexamethasone  4 mg TID as well.     PREVIOUS RADIATION THERAPY:  No   PAST MEDICAL HISTORY:  Past Medical History:  Diagnosis Date   Coronary artery disease    Hyperlipidemia    Hypertension    Myocardial infarction [facility])    PVD (peripheral vascular disease)    S/P angioplasty with stent to Lt. iliac 08/15/12 08/15/2012       PAST SURGICAL HISTORY: Past Surgical History:  Procedure Laterality Date   2D Echocardiogram  08/25/2004   EF >55%, LA-moderately dilated.   BIOPSY  03/05/2023   Procedure:  BIOPSY;  Surgeon: Rollin Dover, MD;  Location: [facility] ENDOSCOPY;  Service: Gastroenterology;;   CARDIAC CATHETERIZATION  03/11/2002   RCA subtotally occluded with TIMI-1 to1/2 flow stented with a 3x23 Cypher DES resulting in reduction of occlusion to 0% with TIMI-3 flow   CORONARY BALLOON ANGIOPLASTY N/A 02/22/2023   Procedure: CORONARY BALLOON ANGIOPLASTY;  Surgeon: Dann Candyce RAMAN, MD;  Location: [facility] INVASIVE CV LAB;  Service: Cardiovascular;  Laterality: N/A;   CORONARY THROMBECTOMY N/A 02/22/2023   Procedure: Coronary Thrombectomy;  Surgeon: Dann Candyce RAMAN, MD;  Location: [facility] INVASIVE CV LAB;  Service: Cardiovascular;  Laterality: N/A;   ESOPHAGOGASTRODUODENOSCOPY (EGD) WITH PROPOFOL  N/A 03/05/2023   Procedure: ESOPHAGOGASTRODUODENOSCOPY (EGD) WITH PROPOFOL ;  Surgeon: Rollin Dover, MD;  Location: [facility] ENDOSCOPY;  Service: Gastroenterology;  Laterality: N/A;   LEA Doppler  08/29/2012   L SFA Proximal: 70-99% diameter reduction. Velocities suggest upper end of range-new finding when compared to previous study. L EIA stent: opne and patent without evidence of restenosis   LEFT HEART CATH AND CORONARY ANGIOGRAPHY N/A 07/07/2022   Procedure: LEFT HEART CATH AND CORONARY ANGIOGRAPHY;  Surgeon: Jordan, Peter M, MD;  Location: [facility] INVASIVE CV LAB;  Service: Cardiovascular;  Laterality: N/A;   LEFT HEART CATH AND CORONARY ANGIOGRAPHY N/A 02/22/2023   Procedure: LEFT HEART CATH AND CORONARY ANGIOGRAPHY;  Surgeon: Dann Candyce RAMAN, MD;  Location: [facility] INVASIVE CV LAB;  Service: Cardiovascular;  Laterality: N/A;   Lexiscan  Myoview  07/19/2012   No significant ECG changes, mild inferobasal hypokinesis with normal LV function, low risk stress nuclear study   PV Intervention  08/15/2012   Occluded L External Iliac-stented with a 10x62mm Abbott Nitinol Absolute Pro self-expanding stent. 75% stenosis in L Common Femoral artery stented witha 8x64mm lon Absolute Nitinol Absolute Po self-expanding stent.      FAMILY HISTORY:  Family History  Problem Relation Age of Onset   Diabetes Mother    Heart disease Mother 66   Hyperlipidemia Mother    Kidney disease Mother    Heart disease Father 87   Cancer Father 32   Stroke Maternal Grandfather        Mid 44s   Lung disease Paternal Grandfather        Black Lung Disease     SOCIAL HISTORY:  reports that he quit smoking about 26 years ago. His smoking use included cigarettes. He has never used smokeless tobacco. He reports that he does not drink alcohol and does not use drugs. The patient is married and lives in [place] he has an adult son.    ALLERGIES: Heparin    MEDICATIONS:  Current Facility-Administered Medications  Medication Dose Route Frequency Provider Last Rate Last Admin   acetaminophen  (TYLENOL ) tablet 650 mg  650 mg Oral Q6H PRN Alfornia Madison, MD       Or   acetaminophen  (TYLENOL ) suppository 650 mg  650 mg Rectal Q6H PRN Alfornia Madison, MD       budesonide -glycopyrrolate -formoterol  (BREZTRI ) 160-9-4.8 MCG/ACT inhaler 2 puff  2 puff Inhalation BID Odell Celinda Balo, MD       dexamethasone  (DECADRON ) tablet 4 mg  4 mg Oral Q8H Odell Celinda Balo, MD   4 mg at 08/14/24 2131     REVIEW OF SYSTEMS: On review of systems, the patient's wife states her husband is mostly back to baseline but had been experiencing a week of more sleepiness, forgetfulness, and being repetitive. He had some slurring of his speech as well. He has not had any change in bowel habits or complaints of anorectal pain. He has not had a colonoscopy in the past.   PHYSICAL EXAM:  Wt Readings from Last 3 Encounters:  08/15/24 146 lb 2.6 oz (66.3 kg)  08/04/24 143 lb 6.4 oz (65 kg)  07/03/24 158 lb (71.7 kg)   Temp Readings from Last 3 Encounters:  08/15/24 97.9 F (36.6 C) (Oral)  07/03/24 (!) 97.5 F (36.4 C) (Oral)  05/25/24 98.5 F (36.9 C) (Oral)   BP Readings from Last 3 Encounters:  08/15/24 (!) 116/57  08/04/24 116/64   07/03/24 (!) 116/58   Pulse Readings from Last 3 Encounters:  08/15/24 66  08/04/24 (!) 103  07/03/24 93   Pain Assessment Pain Score: 0-No pain/10  Unable to assess due to encounter type    ECOG = 2  0 - Asymptomatic (Fully active, able to carry on all predisease activities without restriction)  1 - Symptomatic but completely ambulatory (Restricted in physically strenuous activity but ambulatory and able to carry out work of a light or sedentary nature. For example, light housework, office work)  2 - Symptomatic, <50% in bed during the day (Ambulatory and capable of all self care but unable to carry out any work activities. Up and about more than 50% of waking hours)  3 - Symptomatic, >50% in bed, but not bedbound (Capable of only limited self-care, confined to bed or chair 50% or more of waking hours)  4 - Bedbound (Completely disabled. Cannot carry on any self-care. Totally confined to bed or chair)  5 - Death   Raylene MM, Creech RH, Tormey DC, et al. [phone]). Toxicity and response criteria of the [facility]. Am. DOROTHA Bridges. Oncol. 5 (6): 649-55    LABORATORY DATA:  Lab Results  Component Value Date   WBC 23.9 (H) 08/14/2024   HGB 10.0 (L) 08/14/2024   HCT 32.4 (L) 08/14/2024   MCV 94.7 08/14/2024   PLT 337 08/14/2024   Lab Results  Component Value Date   NA 136 08/15/2024   K 4.9 08/15/2024   CL 101 08/15/2024   CO2 24 08/15/2024   Lab Results  Component Value Date   ALT 6 08/12/2024   AST 19 08/12/2024   ALKPHOS 121 08/12/2024   BILITOT 0.4 08/12/2024      RADIOGRAPHY: CT ABDOMINAL MASS BIOPSY Result Date: 08/15/2024 INDICATION: Large right upper lobe mass compatible with malignancy, right adrenal metastasis by imaging as well EXAM: CT-GUIDED BIOPSY RIGHT ADRENAL METASTASIS MEDICATIONS: 1% LIDOCAINE  LOCAL ANESTHESIA/SEDATION: 1.0 mg IV Versed ; 50 mcg IV Fentanyl  Moderate Sedation Time:  10 MINUTE The patient was continuously  monitored during the procedure by the interventional radiology nurse under my direct supervision. PROCEDURE: The procedure, risks, benefits, and alternatives were explained to the patient. Questions regarding the procedure were encouraged and answered. The patient understands and consents to the procedure. Previous imaging reviewed. Patient position right side down oblique decubitus. Noncontrast localization CT performed. The large right adrenal metastasis was localized and marked  for biopsy via a posterior paraspinous approach. Under sterile conditions and local anesthesia, the 17 gauge 6.8 cm guide needle was advanced from a posterior approach to the lesion. Needle position confirmed along the posterior margin of the adrenal metastasis. 18 gauge core biopsies obtained. These were placed in formalin. Needle removed. Postprocedure imaging demonstrates trace hemorrhage but no large hematoma. Patient tolerated the procedure well without complication. Vital sign monitoring by nursing staff during the procedure will continue as patient is in the special procedures unit for post procedure observation. FINDINGS: The images document guide needle placement within the right adrenal metastasis. Post biopsy images demonstrate trace surrounding hemorrhage. COMPLICATIONS: None immediate. IMPRESSION: Successful CT-guided core biopsy of the right adrenal metastasis RADIATION DOSE REDUCTION: This exam was performed according to the departmental dose-optimization program which includes automated exposure control, adjustment of the mA and/or kV according to patient size and/or use of iterative reconstruction technique. Electronically Signed   By: CHRISTELLA.  Shick M.D.   On: 08/15/2024 11:20   MR Brain W and Wo Contrast Result Date: 08/12/2024 EXAM: MRI BRAIN WITH AND WITHOUT CONTRAST 08/12/2024 11:12:35 PM TECHNIQUE: Multiplanar multisequence MRI of the head/brain was performed with and without the administration of intravenous contrast.  7 mL of gadobutrol  (GADAVIST ) 1 MMOL/ML injection was administered. COMPARISON: None available. CLINICAL HISTORY: Metastatic disease evaluation. FINDINGS: BRAIN AND VENTRICLES: No acute infarct. There are 3 contrast-enhancing metastatic lesions within the brain: 1. Left frontal lobe 3.2 x 3.0 cm with surrounding moderate vasogenic edema, heterogeneous internal signal with hemorrhagic changes. 2. Parasagittal right occipital lobe 1.7 x 1.4 cm with mild surrounding vasogenic edema and internal hemorrhagic change. 3. Superior right frontal lobe 7 mm with mild surrounding edema and evidence of prior hemorrhage. There is mild mass effect on the left lateral ventricle but no midline shift or herniation. No hydrocephalus. The sella is unremarkable. Normal flow voids. ORBITS: No significant abnormality. SINUSES: No significant abnormality. BONES AND SOFT TISSUES: Normal bone marrow signal. No soft tissue abnormality. IMPRESSION: 1. Three contrast-enhancing intracranial metastatic lesions with associated hemorrhagic change and vasogenic edema, largest in the left frontal lobe (3.2 x 3.0 cm), with additional lesions in the parasagittal right occipital lobe (1.7 x 1.4 cm) and superior right frontal lobe (7 mm). 2. Mild mass effect on the left lateral ventricle without midline shift or herniation. Electronically signed by: Franky Stanford MD 08/12/2024 11:26 PM EST RP Workstation: [id number]   CT ABDOMEN PELVIS W CONTRAST Result Date: 08/12/2024 EXAM: CT ABDOMEN AND PELVIS WITH CONTRAST 08/12/2024 10:42:24 PM TECHNIQUE: CT of the abdomen and pelvis was performed with the administration of 75 mL of iohexol  (OMNIPAQUE ) 350 MG/ML injection. Multiplanar reformatted images are provided for review. Automated exposure control, iterative reconstruction, and/or weight-based adjustment of the mA/kV was utilized to reduce the radiation dose to as low as reasonably achievable. COMPARISON: CT abdomen and pelvis 10/24/2006. CLINICAL HISTORY:  Metastatic disease evaluation. FINDINGS: LOWER CHEST: There are increasing fibrotic changes in the lung bases. LIVER: The liver is unremarkable. GALLBLADDER AND BILE DUCTS: The gallbladder is surgically absent. No biliary ductal dilatation. SPLEEN: No acute abnormality. PANCREAS: No acute abnormality. ADRENAL GLANDS: There is an enlarging 6.1 x 4.9 cm right adrenal mass arg . Left adrenal gland within normal limits. KIDNEYS, URETERS AND BLADDER: There is a punctate calculus in the inferior pole of the right kidney. There are bilateral renal cysts measuring up to 2.8 cm on the right and 3.5 cm on the left. Per consensus, no follow-up is needed for simple  Bosniak type 1 and 2 renal cysts, unless the patient has a malignancy history or risk factors. No hydronephrosis. No perinephric or periureteral stranding. Urinary bladder is unremarkable. GI AND BOWEL: Stomach demonstrates no acute abnormality. Sigmoid and descending colon diverticulosis is present. The appendix appears normal. There is no bowel obstruction. PERITONEUM AND RETROPERITONEUM: No ascites. No free air. VASCULATURE: Aorta is normal in caliber. Severe atherosclerotic disease throughout the aorta. 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 LYMPH NODES: No lymphadenopathy. REPRODUCTIVE ORGANS: There are calcifications in the prostate gland. BONES AND SOFT TISSUES: No acute osseous abnormality. No focal soft tissue abnormality. IMPRESSION: 1. No acute findings. 2. Enlarging 6.1 x 4.9 cm right adrenal mass. 3. 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 Electronically signed by: Greig Pique MD 08/12/2024 11:05 PM EST RP Workstation: [id number]   CT ANGIO HEAD NECK W WO CM Result Date: 08/12/2024 EXAM: CTA Head and Neck with and without Intravenous Contrast. CT Head without Contrast. CLINICAL HISTORY: Neuro deficit, acute, stroke suspected. TECHNIQUE: Axial CTA images of the head and neck performed with and  without intravenous contrast. MIP reconstructed images were created and reviewed. Axial computed tomography images of the head/brain performed without intravenous contrast. Note: Per PQRS, the description of internal carotid artery percent stenosis, including 0 percent or normal exam, is based on [employer] American Symptomatic Carotid Endarterectomy Trial (NASCET) criteria. Dose reduction technique was used including one or more of the following: automated exposure control, adjustment of mA and kV according to patient size, and/or iterative reconstruction. CONTRAST: With and without; 75 mL iohexol  (OMNIPAQUE ) 350 MG/ML injection. COMPARISON: None provided. FINDINGS: CT HEAD: BRAIN: There are hyperdense masses within the brain. The first is at the left frontal lobe and measures 3.3 x 3.1 cm with moderate surrounding vasogenic edema. The second is at the medial right occipital lobe and measures 1.8 x 1.8 cm with moderate surrounding vasogenic edema. A third lesion at the superior right frontal lobe measures 9 mm with mild surrounding vasogenic edema. No CT evidence for acute territorial infarct. No midline shift or extra-axial collection. VENTRICLES: No hydrocephalus. ORBITS: The orbits are unremarkable. SINUSES AND MASTOIDS: The paranasal sinuses and mastoid air cells are clear. CTA NECK: COMMON CAROTID ARTERIES: There is mixed density atherosclerotic disease at both carotid bifurcations. On the right, this results in less than 50% stenosis. On the left, it results in approximately 50% stenosis. No dissection or occlusion. INTERNAL CAROTID ARTERIES: Both internal carotid arteries remain widely patent to the skull base. No stenosis by NASCET criteria. No dissection or occlusion. VERTEBRAL ARTERIES: Moderate stenosis at the origin of the left vertebral artery which is otherwise widely patent with mild multifocal atherosclerotic calcification. No dissection or occlusion. CTA HEAD: ANTERIOR CEREBRAL ARTERIES: No significant  stenosis. No occlusion. No aneurysm. MIDDLE CEREBRAL ARTERIES: No significant stenosis. No occlusion. No aneurysm. POSTERIOR CEREBRAL ARTERIES: Fetal origin of the right PCA. No significant stenosis. No occlusion. No aneurysm. BASILAR ARTERY: No significant stenosis. No occlusion. No aneurysm. OTHER: There is mild atherosclerotic calcification of the carotid siphons. SOFT TISSUES: No acute finding. No masses or lymphadenopathy. BONES: No acute osseous abnormality. LUNGS: Complete collapse of the visible portion of the right lung with a large amount of right-sided pleural effusion incompletely visualized. IMPRESSION: 1. Hyperdense masses within the brain, including a 3.3 x 3.1 cm left frontal lobe lesion, a 1.8 x 1.8 cm medial right occipital lobe lesion, and a 9 mm superior right frontal lobe lesion, with associated  vasogenic edema. This is likely metastatic disease and MRI with and without contrast is recommended for further evaluation. 2. Moderate stenosis at the origin of the left vertebral artery, which is otherwise widely patent with mild multifocal atherosclerotic calcification. 3. Mixed density atherosclerotic disease at both carotid bifurcations, resulting in less than 50% stenosis on the right and approximately 50% stenosis on the left, with both internal carotid arteries widely patent to the skull base. 4. Complete collapse of the visible portion of the right lung with a large amount of right-sided pleural effusion, incompletely visualized. Electronically signed by: Franky Stanford MD 08/12/2024 08:43 PM EST RP Workstation: [id number]   CT Angio Chest PE W and/or Wo Contrast Result Date: 08/12/2024 EXAM: CTA CHEST 08/12/2024 08:14:23 PM TECHNIQUE: CTA of the chest was performed without and with intravenous contrast. 75 mL of iohexol  (OMNIPAQUE ) 350 MG/ML injection was administered. Multiplanar reformatted images are provided for review. MIP images are provided for review. Automated exposure control,  iterative reconstruction, and/or weight based adjustment of the mA/kV was utilized to reduce the radiation dose to as low as reasonably achievable. COMPARISON: PET CT dated 07/12/2024. CLINICAL HISTORY: Pulmonary embolism (PE) suspected, high prob. FINDINGS: PULMONARY ARTERIES: Pulmonary arteries are adequately opacified for evaluation. No acute pulmonary embolus. Main pulmonary artery is normal in caliber. MEDIASTINUM: Leftward cardiomediastinal shift. Moderate 3 vessel coronary atherosclerosis. Thoracic aortic atherosclerosis. LYMPH NODES: Small mediastinal and right hilar nodes, including the 13 mm short axis right paratracheal node, grossly unchanged. LUNGS AND PLEURA: 16.4 cm right upper lobe mass, corresponding to the patient's known primary bronchogenic carcinoma, previously 13.8 cm. No focal consolidation or pulmonary edema. No evidence of pleural effusion or pneumothorax. UPPER ABDOMEN: Status post cholecystectomy. 6.2 cm right adrenal mass (image 151) previously 4.7 cm. SOFT TISSUES AND BONES: Mild erosion of the right lateral 3rd through 5th ribs, reflecting osseous invasion. No acute soft tissue abnormality. IMPRESSION: 1. No pulmonary embolism. 2. Enlarging 16.4 cm right upper lobe mass, reflecting primary bronchogenic carcinoma. 3. Associated small thoracic nodal metastases, grossly unchanged. 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 4. Osseous invasion of the right lateral 3rd-5th ribs, similar to prior. Electronically signed by: Pinkie Pebbles MD 08/12/2024 08:40 PM EST RP Workstation: [id number]   DG Chest Port 1 View Result Date: 08/12/2024 EXAM: 1 VIEW(S) XRAY OF THE CHEST 08/12/2024 07:37:00 PM COMPARISON: PET CT dated 07/12/2024. CLINICAL HISTORY: fatigue fatigue fatigue FINDINGS: LUNGS AND PLEURA: Large right upper lobe mass, compatible with primary bronchogenic carcinoma. Coarse interstitial markings and scarring in the bilateral mid and lower lungs. No pleural effusion. No  pneumothorax. HEART AND MEDIASTINUM: No acute abnormality of the cardiac and mediastinal silhouettes. BONES AND SOFT TISSUES: No acute osseous abnormality. IMPRESSION: 1. Large right upper lobe mass, compatible with primary bronchogenic carcinoma. Electronically signed by: Pinkie Pebbles MD 08/12/2024 07:45 PM EST RP Workstation: [id number]       IMPRESSION/PLAN: 1. 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 Dr. Dewey has reviewed the patient's imaging results and work up to date. We will follow up with the results of his biopsy from the adrenal gland. Neurosurgery is also following up with these results and Dr. Lanis does not feel he is a candidate for craniotomy. Rather he appears to be a candidate for stereotactic radiosurgery [facility]). We discussed the need a 3T MRI for purposes of treatment planning and to make sure he is a candidate for this modality of treatment. We discussed the risks, benefits, short, and long term effects of  radiotherapy, as well as the palliative though locally definitive intent, and the patient is interested in proceeding. We reviewed the delivery and logistics of radiotherapy and anticipates a course of 3 fractions of radiotherapy to the larger lesions and single fraction treatment to the smaller lesions. We will review the results of his 3T MRI prior to proceeding. He will also see Dr. Timmy tomorrow, and we discussed the possibility of also using radiation to palliate his lung, but will revisit this after more information is gathered. 2. Hypermetabolism of the anorectal canal. He will need further evaluation of this finding in the outpatient setting and he would like to be seen by  GI.  3. Elevated PSA with nodular prostate exam. The patient will have his MRI prostate as an outpatient. This will be followed as well.   In a visit lasting 60 minutes, greater than 50% of the time was spent by phone and in floor time discussing the  patient's condition, in preparation for the discussion, and coordinating the patient's care.       Donald KYM Husband, [facility]   **Disclaimer: This note was dictated with voice recognition software. Similar sounding words can inadvertently be transcribed and this note may contain transcription errors which may not have been corrected upon publication of note.**  "

## 2024-08-15 NOTE — Progress Notes (Signed)
 SLP Cancellation Note  Patient Details Name: Jeffrey Norton [name] DOB: February 11, 1951   Cancelled treatment:        Attempted to see pt for swallowing assessment. Spoke with nursing. Pt off floor having just completed biopsy, decreased LOA. He was on diet prior to procedure and should be able to restart diet.  SLP will reattempt as schedule permits.   [name], MA, CCC-SLP Acute Rehabilitation Services Office: [phone] 08/15/2024, 9:35 AM

## 2024-08-16 ENCOUNTER — Other Ambulatory Visit: Payer: Self-pay | Admitting: Radiation Therapy

## 2024-08-16 ENCOUNTER — Inpatient Hospital Stay (HOSPITAL_COMMUNITY)

## 2024-08-16 DIAGNOSIS — C7931 Secondary malignant neoplasm of brain: Secondary | ICD-10-CM | POA: Diagnosis not present

## 2024-08-16 LAB — BASIC METABOLIC PANEL WITH GFR
Anion gap: 9 (ref 5–15)
BUN: 75 mg/dL — ABNORMAL HIGH (ref 8–23)
CO2: 27 mmol/L (ref 22–32)
Calcium: 9.3 mg/dL (ref 8.9–10.3)
Chloride: 102 mmol/L (ref 98–111)
Creatinine, Ser: 1.49 mg/dL — ABNORMAL HIGH (ref 0.61–1.24)
GFR, Estimated: 49 mL/min — ABNORMAL LOW
Glucose, Bld: 148 mg/dL — ABNORMAL HIGH (ref 70–99)
Potassium: 4.6 mmol/L (ref 3.5–5.1)
Sodium: 138 mmol/L (ref 135–145)

## 2024-08-16 LAB — AMMONIA: Ammonia: 25 umol/L (ref 9–35)

## 2024-08-16 LAB — MAGNESIUM: Magnesium: 2.3 mg/dL (ref 1.7–2.4)

## 2024-08-16 LAB — TSH: TSH: 1.42 u[IU]/mL (ref 0.350–4.500)

## 2024-08-16 LAB — T4, FREE: Free T4: 1.11 ng/dL (ref 0.80–2.00)

## 2024-08-16 MED ORDER — SENNOSIDES-DOCUSATE SODIUM 8.6-50 MG PO TABS
1.0000 | ORAL_TABLET | Freq: Every day | ORAL | Status: DC
  Administered 2024-08-16 – 2024-08-24 (×6): 1 via ORAL
  Filled 2024-08-16 (×10): qty 1

## 2024-08-16 MED ORDER — SACCHAROMYCES BOULARDII 250 MG PO CAPS
250.0000 mg | ORAL_CAPSULE | Freq: Two times a day (BID) | ORAL | Status: DC
  Administered 2024-08-16 – 2024-08-24 (×16): 250 mg via ORAL
  Filled 2024-08-16 (×20): qty 1

## 2024-08-16 MED ORDER — GADOBUTROL 1 MMOL/ML IV SOLN
7.0000 mL | Freq: Once | INTRAVENOUS | Status: AC | PRN
  Administered 2024-08-16: 7 mL via INTRAVENOUS

## 2024-08-16 MED ORDER — SODIUM CHLORIDE 0.9 % IV SOLN: INTRAVENOUS | Status: DC

## 2024-08-16 MED ORDER — FERROUS SULFATE 325 (65 FE) MG PO TABS: 325.0000 mg | ORAL_TABLET | Freq: Every day | ORAL | Status: DC

## 2024-08-16 MED ORDER — SODIUM CHLORIDE 0.9 % IV SOLN
250.0000 mg | Freq: Every day | INTRAVENOUS | Status: AC
  Administered 2024-08-16 – 2024-08-18 (×3): 250 mg via INTRAVENOUS
  Filled 2024-08-16 (×4): qty 20

## 2024-08-16 MED ORDER — ONDANSETRON HCL 4 MG/2ML IJ SOLN: 4.0000 mg | Freq: Four times a day (QID) | INTRAMUSCULAR | Status: DC | PRN

## 2024-08-16 NOTE — Evaluation (Signed)
 Occupational Therapy Evaluation Patient Details Name: Jeffrey Norton [name] DOB: Dec 24, 1950 Today's Date: 08/16/2024   History of Present Illness   74 y.o. male presents 08/12/24 for acute onset AMS, aphasia, and generalized weakness. CT of the head showed multiple brain mass in the left frontal right occipital and right frontal lobe with vasogenic edema. Underwent IR biopsy of adrenal gland 1/20. PMH: CAD status post multiple PCIs, paroxysmal A-fib/flutter for which patient had previously declined anticoagulation, HFmrEF, pericarditis/pericardial effusion, CKD stage IIIa, HTN, HLD, PVD, GERD, GI bleed, chronic hypoxemic respiratory failure on 2 L home O2. 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 Pt currently requires Min A for functional transfers and up to Mod A for ADL engagement. Pt primarily limited by generalized weakness, decreased activity tolerance, unsteadiness on feet, and decreased knowledge of DME. OT to continue to follow Pt acutely, recommend HHOT services once Pt medically cleared for d/c to facilitate safe engagement in ADL tasks in home environment and reduce burden of care.      If plan is discharge home, recommend the following:   A little help with walking and/or transfers;A lot of help with bathing/dressing/bathroom;Assistance with cooking/housework;Direct supervision/assist for medications management;Assist for transportation;Help with stairs or ramp for entrance     Functional Status Assessment   Patient has had a recent decline in their functional status and demonstrates the ability to make significant improvements in function in a reasonable and  predictable amount of time.     Equipment Recommendations   Other (comment);BSC/3in1 (Rolling walker)     Recommendations for Other Services         Precautions/Restrictions   Precautions Precautions: Fall Recall of Precautions/Restrictions: Intact Restrictions Weight Bearing Restrictions Per Provider Order: No     Mobility Bed Mobility Overal bed mobility: Needs Assistance Bed Mobility: Supine to Sit, Sit to Supine     Supine to sit: Min assist, HOB elevated, Used rails Sit to supine: Min assist   General bed mobility comments: Min A to facilitate scooting hips towards EOB. Increased time required to elevate trunk from bed. Min A to reposition for return to supine    Transfers Overall transfer level: Needs assistance Equipment used: Rolling walker (2 wheels) Transfers: Sit to/from Stand Sit to Stand: Min assist           General transfer comment: Min A +2 for safety this date to rise from bed. Able to take small steps to the R with Min A verbal cues and management of RW. Explicit verbal cues for proper hand placement on RW      Balance Overall balance assessment: Needs assistance Sitting-balance support: Single extremity supported, Feet supported Sitting balance-Leahy Scale: Fair Sitting balance - Comments: Occassional posterior lean in sitting, able to self correct   Standing balance support: Bilateral upper extremity supported, During functional activity, Reliant on assistive device for balance Standing balance-Leahy Scale: Poor Standing balance comment: Dependent on RW and external support                           ADL either performed or assessed with clinical judgement   ADL Overall ADL's : Needs assistance/impaired Eating/Feeding: Set up  Grooming: Set up   Upper Body Bathing: Minimal assistance   Lower Body Bathing: Moderate assistance   Upper Body Dressing : Set up   Lower Body Dressing: Moderate assistance   Toilet  Transfer: Minimal assistance;BSC/3in1;Rolling walker (2 wheels);Stand-pivot   Toileting- Architect and Hygiene: Total assistance               Vision Patient Visual Report: No change from baseline Vision Assessment?: No apparent visual deficits     Perception         Praxis         Pertinent Vitals/Pain Pain Assessment Pain Assessment: Faces Faces Pain Scale: Hurts little more Pain Location: R pec area at end of eval Pain Descriptors / Indicators: Discomfort, Guarding, Grimacing Pain Intervention(s): Limited activity within patient's tolerance, Repositioned     Extremity/Trunk Assessment Upper Extremity Assessment Upper Extremity Assessment: Generalized weakness   Lower Extremity Assessment Lower Extremity Assessment: Defer to PT evaluation       Communication Communication Communication: No apparent difficulties   Cognition Arousal: Lethargic Behavior During Therapy: Flat affect Cognition: No apparent impairments             OT - Cognition Comments: Decreased processing speed, increased time to respond to commands but appears [facility]                 Following commands: Impaired Following commands impaired: Follows multi-step commands with increased time, Follows one step commands with increased time     Cueing  General Comments   Cueing Techniques: Verbal cues;Visual cues;Tactile cues  Pt spouse present for evaluation and encouraging.   Exercises     Shoulder Instructions      Home Living Family/patient expects to be discharged to:: Private residence Living Arrangements: Spouse/significant other Available Help at Discharge: Family Type of Home: Other(Comment) (Condo) Home Access: Stairs to enter Entrance Stairs-Number of Steps: 16   Home Layout: One level               Home Equipment: None          Prior Functioning/Environment Prior Level of Function : Independent/Modified Independent;Needs assist              Mobility Comments: Independent. Spouse reports he has slowed down recently but was still independent. No falls ADLs Comments: Occassional light assistance from spouse for ADLs    OT Problem List: Decreased strength;Decreased activity tolerance;Impaired balance (sitting and/or standing);Decreased knowledge of use of DME or AE;Pain   OT Treatment/Interventions: Self-care/ADL training;Therapeutic exercise;Energy conservation;DME and/or AE instruction;Therapeutic activities;Patient/family education;Balance training      OT Goals(Current goals can be found in the care plan section)   Acute Rehab OT Goals Patient Stated Goal: none stated this session OT Goal Formulation: With patient Time For Goal Achievement: 08/30/24 Potential to Achieve Goals: Good ADL Goals Pt Will Perform Grooming: Independently;sitting Pt Will Perform Upper Body Dressing: Independently;sitting Pt Will Perform Lower Body Dressing: with min assist;sitting/lateral leans Pt Will Transfer to Toilet: with supervision;bedside commode   OT Frequency:  Min 2X/week    Co-evaluation PT/OT/SLP Co-Evaluation/Treatment: Yes Reason for Co-Treatment: Complexity of the patient's impairments (multi-system involvement);For patient/therapist safety;To address functional/ADL transfers   OT goals addressed during session: ADL's and self-care;Proper use of Adaptive equipment and DME;Strengthening/ROM      AM-PAC OT 6 Clicks Daily Activity     Outcome Measure Help from another person eating meals?: A Little Help from another person taking care of personal grooming?: A Little Help from another  person toileting, which includes using toliet, bedpan, or urinal?: Total Help from another person bathing (including washing, rinsing, drying)?: A Lot Help from another person to put on and taking off regular upper body clothing?: A Little Help from another person to put on and taking off regular lower body clothing?: A Lot 6 Click  Score: 14   End of Session Equipment Utilized During Treatment: Gait belt;Rolling walker (2 wheels)  Activity Tolerance: Patient tolerated treatment well Patient left: in bed;with call bell/phone within reach;with bed alarm set;with family/visitor present  OT Visit Diagnosis: Unsteadiness on feet (R26.81);Muscle weakness (generalized) (M62.81);Pain                Time: 9163-9089 OT Time Calculation (min): 34 min Charges:  OT General Charges $OT Visit: 1 Visit OT Evaluation $OT Eval Low Complexity: 1 Low  Maurilio CROME, OTR/L.  [facility] Acute Rehabilitation  Office: [phone]   Maurilio PARAS Tabitha Tupper 08/16/2024, 9:59 AM

## 2024-08-16 NOTE — Plan of Care (Signed)

## 2024-08-16 NOTE — Progress Notes (Signed)
 [facility] Progress Note Patient: Jeffrey Norton FMW:[id number] DOB: May 27, 1951  DOA: 08/12/2024 DOS: the patient was seen and examined on 08/16/2024  Brief Summary: Patient is a 74 y.o.  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   Significant events: 1/17>> admit to [facility]   Significant studies: 1/17>> MRI brain: 3 contrast-enhancing metastatic lesions in the brain. 1/17>> CT abdomen/pelvis: Enlarging 6.1 x 4.9 cm right adrenal mass 1/17>> CTA head/neck: Moderate stenosis of left vertebral artery-less than 50% stenosis of right carotid, approximately 50% stenosis of left carotid artery. 1/17>> CTA chest: No PE, enlarging 16.4 cm right upper lobe mass.   Significant microbiology data: 1/18>> blood culture: No growth 1/19>> COVID/influenza/RSV PCR: Negative   Procedures: 1/20>> CT-guided right adrenal mass biopsy by IR   Consults: Neurosurgery-Dr. Lanis Palliative care IR Medical oncology-Dr. Timmy Radiation oncology-Dr. Dewey  Assessment and Plan: Acute metabolic encephalopathy secondary to brain mets with hemorrhagic change/vasogenic edema Encephalopathy has improved after initiation of steroids Neurosurgery recommends that the patient would benefit from stereotactic radiation rather than craniotomy. Per patient, radiation oncology discussed with them on 1/21 that MRI confirms that the stereotactic radiation can be a viable option for the patient. Awaiting further clarity from medical oncology.   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 Awaiting results of the biopsy as well as further clarification with regards to need for  radiation therapy  AKI on CKD stage  IIIa AKI likely hemodynamically mediated Overall improved-levels continue to fluctuate but creatinine close to baseline-follow   Chronic hypoxic respiratory failure In the setting of COPD/lung mass. Supportive care Oxygen  as needed   COPD Not in exacerbation Continue bronchodilators   Chronic HFrEF Euvolemic Receiving IV hydration.  Monitor.   History of CAD s/p multiple PCI-most recently in 2024 No anginal symptoms On 81 mg aspirin  at home.  Will resume tomorrow on 1/22.   PAF Telemetry monitoring Previously has refused anticoagulation   PAD Asymptomatic  Dysphagia. SLP following. Recommending to advance diet to dysphagia 3 diet and if tolerated well can advance to regular diet. Monitor.  Iron deficiency anemia. Hemoglobin appears to be stable at around 10. No active bleeding reported. Iron level significantly low. Medical oncology started the patient on IV iron.  Will monitor.  Asterixis. Etiology not clear. Suspect secondary to uremia. Initiating IV fluid. Monitor. Also had some difficulty with past-pointing on right as well as ptosis on the right.  DVT Prophylaxis: SCDs Start: 08/13/24 0021  Data review I have Reviewed nursing notes, Vitals, and Lab results. Since last encounter, pertinent lab results CBC and BMP   . I have ordered test including CBC and BMP  .  Family Communication: Family at bedside  Disposition Plan: Status is: Inpatient Remains inpatient appropriate because: Monitor for improvement in renal function no further clarity of plan of care from oncology services   Planned Discharge Destination:Home Diet: Diet Order             DIET DYS 3 Room service appropriate? Yes; Fluid consistency: Thin  Diet effective now                   MEDICATIONS: Scheduled Meds:  budesonide -glycopyrrolate -formoterol   2 puff Inhalation BID   dexamethasone   4 mg Oral Q8H   senna-docusate  1 tablet Oral QHS   Continuous Infusions:  sodium  chloride 75 mL/hr at 08/16/24 1114   ferric gluconate (FERRLECIT ) IVPB 250 mg (08/16/24 1812)   PRN Meds:.acetaminophen  **OR** acetaminophen , ondansetron  (ZOFRAN ) IV  Author: Yetta Blanch, MD  [facility] 08/16/2024  6:19 PM Between 7PM-7AM, please contact night-coverage, check [email or web address] for on call.

## 2024-08-16 NOTE — TOC Progression Note (Signed)
 Transition of Care [facility]) - Progression Note    Patient Details  Name: Jeffrey Norton [name] Date of Birth: January 25, 1951  [facility] CM/SW Contact  Landry DELENA Senters, RN Phone Number: 08/16/2024, 3:25 PM  Clinical Narrative:   Right Adrenal biopsy completed, awaiting results.   Continued medical workup.  CM will continue to follow.     Barriers to Discharge: Continued Medical Work up               Expected Discharge Plan and Services       Living arrangements for the past 2 months: Single Family Home                                       Social Drivers of Health (SDOH) Interventions SDOH Screenings   Food Insecurity: No Food Insecurity (08/14/2024)  Housing: Low Risk (08/14/2024)  Transportation Needs: No Transportation Needs (08/14/2024)  Utilities: Not At Risk (08/14/2024)  Social Connections: Moderately Isolated (08/14/2024)  Tobacco Use: Medium Risk (08/13/2024)    Readmission Risk Interventions     No data to display

## 2024-08-16 NOTE — Evaluation (Signed)
 Clinical/Bedside Swallow Evaluation Patient Details  Name: Jeffrey Norton [name] Date of Birth: 1950/11/14  Today's Date: 08/16/2024 Time: SLP Start Time (ACUTE ONLY): 0931 SLP Stop Time (ACUTE ONLY): 0944 SLP Time Calculation (min) (ACUTE ONLY): 13 min  Past Medical History:  Past Medical History:  Diagnosis Date   Coronary artery disease    Hyperlipidemia    Hypertension    Myocardial infarction (HCC)    PVD (peripheral vascular disease)    S/P angioplasty with stent to Lt. iliac 08/15/12 08/15/2012   Past Surgical History:  Past Surgical History:  Procedure Laterality Date   2D Echocardiogram  08/25/2004   EF >55%, LA-moderately dilated.   BIOPSY  03/05/2023   Procedure: BIOPSY;  Surgeon: Jeffrey Dover, MD;  Location: [facility] ENDOSCOPY;  Service: Gastroenterology;;   CARDIAC CATHETERIZATION  03/11/2002   RCA subtotally occluded with TIMI-1 to1/2 flow stented with a 3x23 Cypher DES resulting in reduction of occlusion to 0% with TIMI-3 flow   CORONARY BALLOON ANGIOPLASTY N/A 02/22/2023   Procedure: CORONARY BALLOON ANGIOPLASTY;  Surgeon: Jeffrey Candyce RAMAN, MD;  Location: [facility] INVASIVE CV LAB;  Service: Cardiovascular;  Laterality: N/A;   CORONARY THROMBECTOMY N/A 02/22/2023   Procedure: Coronary Thrombectomy;  Surgeon: Jeffrey Candyce RAMAN, MD;  Location: [facility] INVASIVE CV LAB;  Service: Cardiovascular;  Laterality: N/A;   ESOPHAGOGASTRODUODENOSCOPY (EGD) WITH PROPOFOL  N/A 03/05/2023   Procedure: ESOPHAGOGASTRODUODENOSCOPY (EGD) WITH PROPOFOL ;  Surgeon: Jeffrey Dover, MD;  Location: [facility] ENDOSCOPY;  Service: Gastroenterology;  Laterality: N/A;   LEA Doppler  08/29/2012   L SFA Proximal: 70-99% diameter reduction. Velocities suggest upper end of range-new finding when compared to previous study. L EIA stent: opne and patent without evidence of restenosis   LEFT HEART CATH AND CORONARY ANGIOGRAPHY N/A 07/07/2022   Procedure: LEFT HEART CATH AND CORONARY ANGIOGRAPHY;  Surgeon: Jordan, Peter M,  MD;  Location: [facility] INVASIVE CV LAB;  Service: Cardiovascular;  Laterality: N/A;   LEFT HEART CATH AND CORONARY ANGIOGRAPHY N/A 02/22/2023   Procedure: LEFT HEART CATH AND CORONARY ANGIOGRAPHY;  Surgeon: Jeffrey Candyce RAMAN, MD;  Location: [facility] INVASIVE CV LAB;  Service: Cardiovascular;  Laterality: N/A;   Lexiscan  Myoview  07/19/2012   No significant ECG changes, mild inferobasal hypokinesis with normal LV function, low risk stress nuclear study   PV Intervention  08/15/2012   Occluded L External Iliac-stented with a 10x14mm Abbott Nitinol Absolute Pro self-expanding stent. 75% stenosis in L Common Femoral artery stented witha 8x17mm lon Absolute Nitinol Absolute Po self-expanding stent.   HPI:  Jeffrey Norton is a 73 yo Norton who presented to the ED with confusion-found to have multiple brain masses 2/2 metastatic lung Ca. Adrenal biopsy 1/20, results pending. MRI 1/21: Four intracranial metastases, ranging from punctate to 37 mm, annotated on series 1100. Stable vasogenic edema and mild mass effect, no midline shift. CT chest 1/17: 16.4 cm right upper lobe mass, corresponding to the patient's known primary bronchogenic carcinoma, previously 13.8 cm. No focal consolidation or pulmonary edema. No evidence of pleural effusion or pneumothorax.  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    Assessment / Plan / Recommendation  Clinical Impression  Pt presents with functional swallowing as assessed clinically.  There was throat clear, wet/congested vocal quality following initial trial of thin liquid by straw only.  There were no other clinical s/s of aspiration with any consistencies trialed, including further  straw sips of thin liquid.  Pt with cough at baseline. Wife reports pt bringing up secretions, which is most likely the cause of cough observed today.  No  consolidation per chest imaging on admission.  Pt has had decreased appetite with weightloss.  Wife reports pt has improved with initiation of steroids.  Discussed diet preference with pt and wife to balance ease of intake with appetitive diet.    Will advance to mechanical soft, though pt appears to be able to safely consume a regular texture diet if desired.  Consider RD consult to optimize oral nutrition.  SLP to follow for diet tolerance/advancement.   SLP Visit Diagnosis: Dysphagia, oral phase (R13.11)    Aspiration Risk  Mild aspiration risk    Diet Recommendation Dysphagia 3 (Mech soft);Thin liquid    Liquid Administration via: Cup;Straw Medication Administration:  (As tolerated, no specific precautions) Supervision: Patient able to self feed Compensations: Slow rate;Small sips/bites Postural Changes: Seated upright at 90 degrees    Other Recommendations Recommended Consults:  (Consider RD assessment) Oral Care Recommendations: Oral care BID      Functional Status Assessment Patient has had a recent decline in their functional status and demonstrates the ability to make significant improvements in function in a reasonable and predictable amount of time.  Frequency and Duration min 2x/week  2 weeks       Prognosis Prognosis for improved oropharyngeal function: Good      Swallow Study   General Date of Onset: 08/12/24 HPI: Jeffrey Norton is a 74 yo Norton who presented to the ED with confusion-found to have multiple brain masses 2/2 metastatic lung Ca. Adrenal biopsy 1/20, results pending. MRI 1/21: Four intracranial metastases, ranging from punctate to 37 mm, annotated on series 1100. Stable vasogenic edema and mild mass effect, no midline shift. CT chest 1/17: 16.4 cm right upper lobe mass, corresponding to the patient's known primary bronchogenic carcinoma, previously 13.8 cm. No focal consolidation or pulmonary edema. No evidence of pleural effusion or pneumothorax.  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 Type of Study: Bedside Swallow Evaluation Previous Swallow Assessment: none Diet Prior to this Study: Dysphagia 2 (finely chopped);Thin liquids (Level 0) Temperature Spikes Noted: No Respiratory Status: Nasal cannula History of Recent Intubation: No Behavior/Cognition: Alert;Cooperative;Pleasant mood Oral Cavity Assessment: Within Functional Limits Oral Care Completed by SLP: No Oral Cavity - Dentition: Adequate natural dentition Vision: Functional for self-feeding Self-Feeding Abilities: Able to feed self Patient Positioning: Upright in bed Baseline Vocal Quality: Normal Volitional Cough: Strong Volitional Swallow: Able to elicit    Oral/Motor/Sensory Function Overall Oral Motor/Sensory Function: Within functional limits   Ice Chips Ice chips: Not tested   Thin Liquid Thin Liquid: Impaired Pharyngeal  Phase Impairments: Cough - Delayed    Nectar Thick Nectar Thick Liquid: Not tested   Honey Thick Honey Thick Liquid: Not tested   Puree Puree: Not tested   Solid     Solid: Within functional limits Presentation: Self Fed Other Comments: Regular and ground/chopped      [name], MA, CCC-SLP Acute Rehabilitation Services Office: [phone] 08/16/2024,10:00 AM

## 2024-08-16 NOTE — Care Management Important Message (Signed)
 Important Message  Patient Details  Name: Jeffrey Norton [name] Date of Birth: [birth year]   Important Message Given:        [name] 08/16/2024, 12:20 PM

## 2024-08-16 NOTE — Evaluation (Signed)
 Physical Therapy Evaluation Patient Details Name: Jeffrey Norton [name] DOB: 17-Jun-1951 Today's Date: 08/16/2024  History of Present Illness  74 y.o. male presents 08/12/24 for acute onset AMS, aphasia, and generalized weakness. CT of the head showed multiple brain mass in the left frontal right occipital and right frontal lobe with vasogenic edema. Underwent IR biopsy of adrenal gland 1/20. PMH: CAD status post multiple PCIs, paroxysmal A-fib/flutter for which patient had previously declined anticoagulation, HFmrEF, pericarditis/pericardial effusion, CKD stage IIIa, HTN, HLD, PVD, GERD, GI bleed, chronic hypoxemic respiratory failure on 2 L home O2. 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 Pt was independent prior to admission. Pt is currently requiring minA for bed mobility to assist with positioning but does well using bed rails to his advantage. Pt completes STS and takes 3 lateral steps to the R with minAx2. Pt fatigues quickly and declines further mobility at this time. Pt with new onset of R pec region pain following mobility, possible due to pushing through walker. Monitored and repositioned at end of session to find position of comfort. Pt with new brain metastasis and ongoing discussion regarding treatment plan. Pt would benefit from continued PT services focused on strength, balance, transfers, and gait as appropriate to promote independence with functional mobility. Will continue to follow regarding GOC.        If plan is discharge home, recommend the following: A little help with walking and/or transfers;A little help with bathing/dressing/bathroom;Assistance with  cooking/housework;Assist for transportation;Help with stairs or ramp for entrance   Can travel by private vehicle        Equipment Recommendations Rolling walker (2 wheels);BSC/3in1 Forensic Scientist chair)  Recommendations for Other Services       Functional Status Assessment Patient has had a recent decline in their functional status and demonstrates the ability to make significant improvements in function in a reasonable and predictable amount of time.     Precautions / Restrictions Precautions Precautions: Fall Recall of Precautions/Restrictions: Intact Restrictions Weight Bearing Restrictions Per Provider Order: No      Mobility  Bed Mobility Overal bed mobility: Needs Assistance Bed Mobility: Supine to Sit, Sit to Supine     Supine to sit: Min assist, HOB elevated, Used rails Sit to supine: Min assist   General bed mobility comments: Increased time to complete transfer, completes ~80% himself but requries minA to pivot hips EOB in order to place B LE flat on the floor. Does well lifting B LE back into bed and requires assist to reposition.    Transfers Overall transfer level: Needs assistance Equipment used: Rolling walker (2 wheels) Transfers: Sit to/from Stand Sit to Stand: Min assist, +2 physical assistance           General transfer comment: Pt with difficulty coordinating hand placement on walker, frequently reaching out for OT on R side. Light assist for balance in standing. Takes 3 steps towards [facility] in order to reposition. Pt declining sitting in chair at this time.    Ambulation/Gait               General Gait Details: 3 steps taken toward [facility], no further ambulation completed this session.  Stairs  Wheelchair Mobility     Tilt Bed    Modified Rankin (Stroke Patients Only)       Balance Overall balance assessment: Needs assistance Sitting-balance support: Single extremity supported, Feet supported Sitting balance-Leahy Scale:  Fair Sitting balance - Comments: Occassional posterior lean in sitting, able to self correct   Standing balance support: Bilateral upper extremity supported, During functional activity, Reliant on assistive device for balance Standing balance-Leahy Scale: Poor Standing balance comment: Dependent on RW and external support, difficulty weight shifting to take step and requried additonal support. Unsteady due to fatigue and generalized weakness.                             Pertinent Vitals/Pain Pain Assessment Pain Assessment: Faces Faces Pain Scale: Hurts little more Pain Location: R pec area Pain Descriptors / Indicators: Aching, Grimacing, Guarding, Sore Pain Intervention(s): Limited activity within patient's tolerance, Repositioned, Monitored during session    Home Living Family/patient expects to be discharged to:: Private residence Living Arrangements: Spouse/significant other Available Help at Discharge: Family Type of Home: Other(Comment) (Condo) Home Access: Stairs to enter   Secretary/administrator of Steps: 16   Home Layout: One level Home Equipment: None      Prior Function Prior Level of Function : Independent/Modified Independent;Needs assist             Mobility Comments: Independent. Spouse reports he has slowed down recently but was still independent. No falls ADLs Comments: Occassional light assistance from spouse for ADLs. Pt indicates to wife when asked about dressing, she shakes her head and states he is still completing most tasks independently.     Extremity/Trunk Assessment   Upper Extremity Assessment Upper Extremity Assessment: Defer to OT evaluation    Lower Extremity Assessment Lower Extremity Assessment: Overall WFL for tasks assessed;Generalized weakness    Cervical / Trunk Assessment Cervical / Trunk Assessment: Normal  Communication   Communication Communication: No apparent difficulties    Cognition Arousal:  Lethargic Behavior During Therapy: Flat affect   PT - Cognitive impairments: Attention, Awareness, Safety/Judgement, Problem solving                         Following commands: Impaired Following commands impaired: Follows multi-step commands with increased time, Follows one step commands with increased time     Cueing Cueing Techniques: Verbal cues, Visual cues, Tactile cues     General Comments General comments (skin integrity, edema, etc.): VSS throughout. No significant skin abnormalities noted.    Exercises     Assessment/Plan    PT Assessment Patient needs continued PT services  PT Problem List Decreased strength;Decreased mobility;Decreased safety awareness;Decreased coordination;Decreased knowledge of precautions;Decreased activity tolerance;Decreased balance;Decreased knowledge of use of DME       PT Treatment Interventions DME instruction;Therapeutic exercise;Gait training;Balance training;Neuromuscular re-education;Stair training;Functional mobility training;Therapeutic activities;Patient/family education    PT Goals (Current goals can be found in the Care Plan section)  Acute Rehab PT Goals Patient Stated Goal: Get stronger and remain independent PT Goal Formulation: With patient Time For Goal Achievement: 08/30/24 Potential to Achieve Goals: Good    Frequency Min 2X/week     Co-evaluation PT/OT/SLP Co-Evaluation/Treatment: Yes Reason for Co-Treatment: Complexity of the patient's impairments (multi-system involvement);For patient/therapist safety;To address functional/ADL transfers PT goals addressed during session: Mobility/safety with mobility;Balance;Proper use of DME;Strengthening/ROM OT goals addressed during session: ADL's and self-care;Proper use of Adaptive equipment and DME;Strengthening/ROM  AM-PAC PT 6 Clicks Mobility  Outcome Measure Help needed turning from your back to your side while in a flat bed without using bedrails?: A  Little Help needed moving from lying on your back to sitting on the side of a flat bed without using bedrails?: A Little Help needed moving to and from a bed to a chair (including a wheelchair)?: A Lot Help needed standing up from a chair using your arms (e.g., wheelchair or bedside chair)?: A Little Help needed to walk in hospital room?: A Lot Help needed climbing 3-5 steps with a railing? : A Lot 6 Click Score: 15    End of Session   Activity Tolerance: Patient limited by fatigue Patient left: in bed;with call bell/phone within reach;with bed alarm set;with family/visitor present Nurse Communication: Mobility status PT Visit Diagnosis: Unsteadiness on feet (R26.81);Muscle weakness (generalized) (M62.81)    Time: 9163-9089 PT Time Calculation (min) (ACUTE ONLY): 34 min   Charges:   PT Evaluation $PT Eval Moderate Complexity: 1 Mod PT Treatments $Therapeutic Activity: 8-22 mins PT General Charges $$ ACUTE PT VISIT: 1 Visit         Sabra Morel, PT, DPT  Acute Rehabilitation Services         Office: [phone]     Sabra MARLA Morel 08/16/2024, 4:10 PM

## 2024-08-17 ENCOUNTER — Ambulatory Visit: Attending: Radiation Oncology | Admitting: Radiation Oncology

## 2024-08-17 DIAGNOSIS — C7931 Secondary malignant neoplasm of brain: Secondary | ICD-10-CM | POA: Diagnosis not present

## 2024-08-17 LAB — PREALBUMIN: Prealbumin: 9 mg/dL — ABNORMAL LOW (ref 18–38)

## 2024-08-17 LAB — CBC WITH DIFFERENTIAL/PLATELET
Abs Immature Granulocytes: 0.26 K/uL — ABNORMAL HIGH (ref 0.00–0.07)
Basophils Absolute: 0 K/uL (ref 0.0–0.1)
Basophils Relative: 0 %
Eosinophils Absolute: 0 K/uL (ref 0.0–0.5)
Eosinophils Relative: 0 %
HCT: 27.9 % — ABNORMAL LOW (ref 39.0–52.0)
Hemoglobin: 8.6 g/dL — ABNORMAL LOW (ref 13.0–17.0)
Immature Granulocytes: 1 %
Lymphocytes Relative: 2 %
Lymphs Abs: 0.4 K/uL — ABNORMAL LOW (ref 0.7–4.0)
MCH: 28.7 pg (ref 26.0–34.0)
MCHC: 30.8 g/dL (ref 30.0–36.0)
MCV: 93 fL (ref 80.0–100.0)
Monocytes Absolute: 1 K/uL (ref 0.1–1.0)
Monocytes Relative: 5 %
Neutro Abs: 21.2 K/uL — ABNORMAL HIGH (ref 1.7–7.7)
Neutrophils Relative %: 92 %
Platelets: 279 K/uL (ref 150–400)
RBC: 3 MIL/uL — ABNORMAL LOW (ref 4.22–5.81)
RDW: 14.9 % (ref 11.5–15.5)
WBC: 22.9 K/uL — ABNORMAL HIGH (ref 4.0–10.5)
nRBC: 0 % (ref 0.0–0.2)

## 2024-08-17 LAB — COMPREHENSIVE METABOLIC PANEL WITH GFR
ALT: 7 U/L (ref 0–44)
AST: 15 U/L (ref 15–41)
Albumin: 2.5 g/dL — ABNORMAL LOW (ref 3.5–5.0)
Alkaline Phosphatase: 126 U/L (ref 38–126)
Anion gap: 10 (ref 5–15)
BUN: 68 mg/dL — ABNORMAL HIGH (ref 8–23)
CO2: 24 mmol/L (ref 22–32)
Calcium: 8.9 mg/dL (ref 8.9–10.3)
Chloride: 104 mmol/L (ref 98–111)
Creatinine, Ser: 1.33 mg/dL — ABNORMAL HIGH (ref 0.61–1.24)
GFR, Estimated: 56 mL/min — ABNORMAL LOW
Glucose, Bld: 171 mg/dL — ABNORMAL HIGH (ref 70–99)
Potassium: 4.6 mmol/L (ref 3.5–5.1)
Sodium: 138 mmol/L (ref 135–145)
Total Bilirubin: 0.3 mg/dL (ref 0.0–1.2)
Total Protein: 5.8 g/dL — ABNORMAL LOW (ref 6.5–8.1)

## 2024-08-17 LAB — CBC
HCT: 28.8 % — ABNORMAL LOW (ref 39.0–52.0)
Hemoglobin: 8.9 g/dL — ABNORMAL LOW (ref 13.0–17.0)
MCH: 29.1 pg (ref 26.0–34.0)
MCHC: 30.9 g/dL (ref 30.0–36.0)
MCV: 94.1 fL (ref 80.0–100.0)
Platelets: 285 K/uL (ref 150–400)
RBC: 3.06 MIL/uL — ABNORMAL LOW (ref 4.22–5.81)
RDW: 15 % (ref 11.5–15.5)
WBC: 25 K/uL — ABNORMAL HIGH (ref 4.0–10.5)
nRBC: 0 % (ref 0.0–0.2)

## 2024-08-17 LAB — LACTATE DEHYDROGENASE: LDH: 248 U/L — ABNORMAL HIGH (ref 105–235)

## 2024-08-17 LAB — URIC ACID: Uric Acid, Serum: 7 mg/dL (ref 3.7–8.6)

## 2024-08-17 NOTE — Progress Notes (Signed)
 I had a chance to talk with Jeffrey Norton this morning.  I had a long talk with Jeffrey Norton and Jeffrey Norton last night.  Jeffrey Norton definitely does not have a great performance status.  I would have to say that Jeffrey Norton performance status is probably ECOG 3.  Jeffrey Norton has lost weight.  Jeffrey Norton had the MRI of the brain.  I think Jeffrey Norton has 4 CNS metastasis.  We really need to get radiotherapy for the brain.  I will have to speak with Dr. Shannon of Radiation Oncology and see if Jeffrey Norton cannot try to expedite some form of therapy.  We are awaiting the pathology.  Hopefully this will come back today.  I told him that I think the only way that we could potentially treat him would be if this was small cell lung cancer.  Otherwise, is would be very large and to put him through chemotherapy for non-small cell lung cancer as I think the chance of responding would be probably less than 15%.  Clearly, nutrition is can be a problem.  Jeffrey Norton prealbumin was only 9.  I think this is a very ominous finding for him.  Jeffrey Norton is getting some IV iron.  Jeffrey Norton had iron deficiency.  Jeffrey Norton iron saturation is only 11%.  I do not think this is small cell lung cancer as Jeffrey Norton uric acid is 7.  Jeffrey Norton LDH is 248.  Jeffrey Norton white cell count is 22.9.  Hemoglobin 8.6.  Platelet count 279,000.  Jeffrey Norton sodium is 138.  Potassium 4.6.  BUN 68 creatinine 1.33.  Calcium  8.9 with an albumin of 2.5.   I noted that Jeffrey Norton is a DNR.  I totally agree with this.  I talked him about this this morning.  I told him that if Jeffrey Norton were to go on life support Jeffrey Norton just would not come off as I just do not think that Jeffrey Norton would be strong enough to get off life support.  Again, this is can be a very challenging problem.  The pathology will be very helpful here.  Again it would be nice if Jeffrey Norton could have radiotherapy for the brain mets.  I think this is so I going to affect Jeffrey Norton quality of life right now.  Jeffrey Norton vital signs show temperature of 98.4.  Pulse 62.  Blood pressure 134/68.  Head neck exam shows some temporal muscle  wasting.  Jeffrey Norton has no oral lesions.  There is no adenopathy in the neck.  Lungs show distant breath sounds on the left side.  Jeffrey Norton has decreased on the right side.  Cardiac exam regular rate and rhythm.  Jeffrey Norton has no murmurs, rubs or bruits.  Abdomen is soft..  Bowel sounds are present.  Jeffrey Norton has no fluid wave.  There is no palpable liver or spleen tip.  Extremity shows muscle atrophy in upper and lower extremities.  Neurological exam shows no focal neurological deficits.  Jeffrey Norton has bronchogenic carcinoma.  We do not know the pathology as yet.  Hopefully, we will get some result back today.  Again I think is can be very difficult to treat him unless this is small cell lung cancer.  Even if this is small cell lung cancer, it still cannot be very challenging because of Jeffrey Norton performance status.  We will continue to follow along.  Again, hopefully we will have more answers today and hopefully be able to come up with a recommendation for treatment.  I know Jeffrey Norton is getting phenomenal care from everybody up on [address].  Jeffrey Crease, MD  Ila 41:10

## 2024-08-17 NOTE — Progress Notes (Signed)
 [facility] Progress Note Patient: Jeffrey Norton FMW:[id number] DOB: Apr 08, 1951  DOA: 08/12/2024 DOS: the patient was seen and examined on 08/17/2024  Brief Summary: Patient is a 74 y.o.  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   Significant events: 1/17>> admit to [facility]   Significant studies: 1/17>> MRI brain: 3 contrast-enhancing metastatic lesions in the brain. 1/17>> CT abdomen/pelvis: Enlarging 6.1 x 4.9 cm right adrenal mass 1/17>> CTA head/neck: Moderate stenosis of left vertebral artery-less than 50% stenosis of right carotid, approximately 50% stenosis of left carotid artery. 1/17>> CTA chest: No PE, enlarging 16.4 cm right upper lobe mass.   Significant microbiology data: 1/18>> blood culture: No growth 1/19>> COVID/influenza/RSV PCR: Negative   Procedures: 1/20>> CT-guided right adrenal mass biopsy by IR 1/20>> Echo 45 to 50%, grade 1 diastolic dysfunction hypokinesis of left ventricle, basal mid inferior wall, inferolateral and inferoseptal wall with reduced RV function and possible PHTN moderate mitral regurg unchanged echo from before other than worsening mitral regurg.  Consults: Neurosurgery-Dr. Lanis Palliative care IR Medical oncology-Dr. Timmy Radiation oncology-Dr. Dewey  Assessment and Plan: Acute metabolic encephalopathy secondary to brain mets with hemorrhagic change/vasogenic edema Encephalopathy has improved after initiation of steroids Neurosurgery recommends that the patient would benefit from stereotactic radiation rather than craniotomy. Per patient, radiation oncology discussed with them on 1/21 that MRI confirms that the stereotactic radiation can be a viable option for the patient. Patient transferred to [facility] for  radiation.   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 Awaiting results of the biopsy as well as further clarification with regards to need for  radiation therapy Patient will transfer to [facility] for inpatient radiation.   AKI on CKD stage IIIa AKI likely hemodynamically mediated Baseline creatinine appears to be around 1.6.  Worsened to 2.1 upon admission.  Improved to 1.3 now with hydration.     Chronic hypoxic respiratory failure In the setting of COPD/lung mass. Supportive care Oxygen  as needed   COPD Not in exacerbation Continue bronchodilators   Chronic HFrEF Euvolemic.  On right actually received IV hydration and tolerated it well.   History of CAD s/p multiple PCI-most recently in 2024 No anginal symptoms On 81 mg aspirin  at home.  Currently hold due to anemia.   PAF Previously has refused anticoagulation Rate controlled on exam.   PAD Asymptomatic   Dysphagia. SLP following. Recommending to advance diet to dysphagia 3 diet and if tolerated well can advance to regular diet. Monitor.   Iron deficiency anemia. Hemoglobin appears to be stable at around 10. Drop on 8.6 is likely secondary to dilution from IV fluid. Will recheck. No active bleeding reported. Iron level significantly low. Medical oncology started the patient on IV iron.  Will monitor.   Asterixis. Resolved.  Suspect secondary to uremia. Treated with IV fluid. Also had some difficulty with past-pointing on right as well as ptosis on the right.  TSH 1.4 WNL, free T41.1 Ammonia 25.   DVT Prophylaxis: SCDs Start: 08/13/24 0021  Data review I have Reviewed nursing notes, Vitals, and Lab results. Since last encounter, pertinent lab results CBC and CMP   . I have ordered test including CBC and MP  .   Family Communication: Family at bedside  Disposition Plan: Status  is: Inpatient Remains  inpatient appropriate because: Receiving inpatient radiation Diet: Diet Order             DIET DYS 3 Room service appropriate? Yes; Fluid consistency: Thin  Diet effective now                   MEDICATIONS: Scheduled Meds:  budesonide -glycopyrrolate -formoterol   2 puff Inhalation BID   dexamethasone   4 mg Oral Q8H   saccharomyces boulardii  250 mg Oral BID   senna-docusate  1 tablet Oral QHS   Continuous Infusions:  sodium chloride  75 mL/hr at 08/16/24 2211   ferric gluconate (FERRLECIT ) IVPB Stopped (08/16/24 2036)   PRN Meds:.acetaminophen  **OR** acetaminophen , ondansetron  (ZOFRAN ) IV  Author: Yetta Blanch, MD  [facility] 08/17/2024  8:06 AM Between 7PM-7AM, please contact night-coverage, check [email or web address] for on call.

## 2024-08-17 NOTE — Plan of Care (Signed)

## 2024-08-17 NOTE — Progress Notes (Signed)
 Bed available for pt, pt will be staying at [place] long. [facility] picked pts belongings up to take to pt and family. Report given to [facility], RN at this time.

## 2024-08-17 NOTE — Progress Notes (Signed)
 I spoke with the patient's wife this morning and we discussed that Dr. Dewey recommends palliative radiation as well to the lung tumor. Pathology is still pending. We discussed 10 fxns of xrt to simulate this morning and discussed risks, benefits, short and long term effects. Pt's wife gives verbal consent to proceed. I will reach out to his hospitalist as well to see if he can transfer to [facility] as we would like to start treatment to the lung tomorrow, and prefer her remain inpt into early next week to ensure he does not have post inflammatory changes to his airway with starting lung radiation.

## 2024-08-17 NOTE — Progress Notes (Signed)
 Pt being transferred to [facility] for radiation treatment at this time. Pt may be returning if bed not available there after treatment. All this explained to Family.

## 2024-08-17 NOTE — Progress Notes (Signed)
 Saw pt today in our dept. He will go to [facility] 1607 from our dept. Plan is for simulation of chest today and to start tomorrow with xrt. He will simulate next week for brain SRS treatment and begin this radiation next week as well. Consent obtained and copies given to pt's spouse.

## 2024-08-18 ENCOUNTER — Ambulatory Visit

## 2024-08-18 ENCOUNTER — Ambulatory Visit: Admitting: Radiation Oncology

## 2024-08-18 ENCOUNTER — Other Ambulatory Visit: Payer: Self-pay

## 2024-08-18 ENCOUNTER — Inpatient Hospital Stay

## 2024-08-18 ENCOUNTER — Inpatient Hospital Stay: Admitting: Hematology & Oncology

## 2024-08-18 ENCOUNTER — Encounter: Payer: Self-pay | Admitting: *Deleted

## 2024-08-18 DIAGNOSIS — G936 Cerebral edema: Secondary | ICD-10-CM | POA: Diagnosis not present

## 2024-08-18 DIAGNOSIS — J9611 Chronic respiratory failure with hypoxia: Secondary | ICD-10-CM | POA: Diagnosis not present

## 2024-08-18 DIAGNOSIS — R918 Other nonspecific abnormal finding of lung field: Secondary | ICD-10-CM | POA: Diagnosis not present

## 2024-08-18 DIAGNOSIS — C7931 Secondary malignant neoplasm of brain: Secondary | ICD-10-CM | POA: Diagnosis not present

## 2024-08-18 LAB — CULTURE, BLOOD (ROUTINE X 2)
Culture: NO GROWTH
Culture: NO GROWTH

## 2024-08-18 LAB — RAD ONC ARIA SESSION SUMMARY
Course Elapsed Days: 0
Plan Fractions Treated to Date: 1
Plan Prescribed Dose Per Fraction: 4 Gy
Plan Total Fractions Prescribed: 1
Plan Total Prescribed Dose: 4 Gy
Reference Point Dosage Given to Date: 4 Gy
Reference Point Session Dosage Given: 4 Gy
Session Number: 1

## 2024-08-18 LAB — CBC
HCT: 30.5 % — ABNORMAL LOW (ref 39.0–52.0)
Hemoglobin: 9.1 g/dL — ABNORMAL LOW (ref 13.0–17.0)
MCH: 28.6 pg (ref 26.0–34.0)
MCHC: 29.8 g/dL — ABNORMAL LOW (ref 30.0–36.0)
MCV: 95.9 fL (ref 80.0–100.0)
Platelets: 301 K/uL (ref 150–400)
RBC: 3.18 MIL/uL — ABNORMAL LOW (ref 4.22–5.81)
RDW: 14.9 % (ref 11.5–15.5)
WBC: 26.4 K/uL — ABNORMAL HIGH (ref 4.0–10.5)
nRBC: 0 % (ref 0.0–0.2)

## 2024-08-18 LAB — BASIC METABOLIC PANEL WITH GFR
Anion gap: 7 (ref 5–15)
BUN: 51 mg/dL — ABNORMAL HIGH (ref 8–23)
CO2: 25 mmol/L (ref 22–32)
Calcium: 9.6 mg/dL (ref 8.9–10.3)
Chloride: 105 mmol/L (ref 98–111)
Creatinine, Ser: 1.04 mg/dL (ref 0.61–1.24)
GFR, Estimated: >60 mL/min
Glucose, Bld: 120 mg/dL — ABNORMAL HIGH (ref 70–99)
Potassium: 4.9 mmol/L (ref 3.5–5.1)
Sodium: 137 mmol/L (ref 135–145)

## 2024-08-18 LAB — SURGICAL PATHOLOGY

## 2024-08-18 MED ORDER — BIOTENE DRY MOUTH MT LIQD
15.0000 mL | OROMUCOSAL | Status: DC
  Administered 2024-08-18 – 2024-08-22 (×19): 15 mL via OROMUCOSAL
  Filled 2024-08-18: qty 237

## 2024-08-18 MED ORDER — ENSURE PLUS HIGH PROTEIN PO LIQD
237.0000 mL | Freq: Two times a day (BID) | ORAL | Status: DC
  Administered 2024-08-18 – 2024-08-24 (×13): 237 mL via ORAL

## 2024-08-18 MED ORDER — FAMOTIDINE 20 MG PO TABS
40.0000 mg | ORAL_TABLET | Freq: Two times a day (BID) | ORAL | Status: DC
  Administered 2024-08-18 – 2024-08-23 (×11): 40 mg via ORAL
  Filled 2024-08-18 (×12): qty 2

## 2024-08-18 NOTE — Progress Notes (Signed)
 " PROGRESS NOTE    Jeffrey Norton  FMW:[id number] DOB: 1950/09/03 DOA: 08/12/2024 PCP: Dayna Motto, DO  Brief Narrative:  Patient is a 74 y.o.  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 Chronically on 2 L nasal cannula at rest and with exertion   COPD Not in exacerbation Continue bronchodilators/supportive care   Chronic HFrEF Euvolemic.  Tolerated IV fluids well, follow clinically -holding any diuretics in the setting of AKI as above   History of CAD s/p multiple PCI-most recently in 2024 Without notable anginal symptoms Hold home aspirin  secondary to anemia, no notable source of bleeding at this time   PAF Previously has refused  anticoagulation Remains rate controlled off any rate control medications   PAD Asymptomatic, aspirin  on hold as above   Dysphagia SLP following. Advancing diet as tolerated -currently on regular diet thin liquids   Iron deficiency anemia. Hemoglobin stable No active bleeding reported. Iron level significantly low -IV iron completed  Asterixis. Resolved.  Suspect secondary to uremia - now resolved TSH 1.4 WNL - free T41.1 Ammonia 25.  DVT prophylaxis: SCDs Start: 08/13/24 0021 Code Status:   Code Status: Limited: Do not attempt resuscitation (DNR) -DNR-LIMITED -Do Not Intubate/DNI  Family Communication: At bedside, wife  Status is: Inpatient  Dispo: The patient is from: Home              Anticipated d/c is to: To be determined              Anticipated d/c date is: To be determined              Patient currently not medically stable for discharge  Consultants:  Radiation oncology Medication oncology Neurosurgery Palliative care Interventional radiology  Procedures:  1/20>> CT-guided right adrenal mass biopsy by IR 1/20>> Echo 45 to 50%, grade 1 diastolic dysfunction hypokinesis of left ventricle, basal mid inferior wall, inferolateral and inferoseptal wall with reduced RV function and possible PHTN moderate mitral regurg unchanged echo from before other than worsening mitral regurg.  Imaging of note 1/17>> MRI brain: 3 contrast-enhancing metastatic lesions in the brain. 1/17>> CT abdomen/pelvis: Enlarging 6.1 x 4.9 cm right adrenal mass 1/17>> CTA head/neck: Moderate stenosis of left vertebral artery-less than 50% stenosis of right carotid, approximately 50% stenosis of left carotid artery. 1/17>> CTA chest: No PE, enlarging 16.4 cm right upper lobe mass.  Antimicrobials:  None  Subjective: No acute issues or events overnight, improving appetite -denies  nausea vomiting diarrhea constipation headache fevers chills or chest pain  Objective: Vitals:   08/17/24 1348  08/17/24 1758 08/17/24 2205 08/18/24 0500  BP: 131/81 (!) 141/69 130/68   Pulse: 73 79 76   Resp: 18 20 20    Temp:   97.7 F (36.5 C)   TempSrc:   Oral   SpO2: 100% 98% 98%   Weight:    64 kg  Height:        Intake/Output Summary (Last 24 hours) at 08/18/2024 0747 Last data filed at 08/18/2024 0500 Gross per 24 hour  Intake 775.4 ml  Output 750 ml  Net 25.4 ml   Filed Weights   08/15/24 0500 08/18/24 0500  Weight: 66.3 kg 64 kg    Examination:  General:  Pleasantly resting in bed, No acute distress. HEENT:  Normocephalic atraumatic.  Sclerae nonicteric, noninjected.  Extraocular movements intact bilaterally. Neck:  Without mass or deformity.  Trachea is midline. Lungs:  Clear to auscultate bilaterally without rhonchi, wheeze, or rales. Heart:  Regular rate and rhythm.  Without murmurs, rubs, or gallops. Abdomen:  Soft, nontender, nondistended.  Without guarding or rebound. Extremities: Without cyanosis, clubbing, edema, or obvious deformity. Skin:  Warm and dry, no erythema.   Data Reviewed: I have personally reviewed following labs and imaging studies  CBC: Recent Labs  Lab 08/12/24 1906 08/12/24 1912 08/13/24 0154 08/14/24 0917 08/17/24 0305 08/17/24 1339  WBC 16.5*  --  15.4* 23.9* 22.9* 25.0*  NEUTROABS 14.5*  --   --   --  21.2*  --   HGB 9.8* 10.2*  9.9* 10.2* 10.0* 8.6* 8.9*  HCT 32.1* 30.0*  29.0* 33.8* 32.4* 27.9* 28.8*  MCV 96.1  --  95.2 94.7 93.0 94.1  PLT 342  --  360 337 279 285   Basic Metabolic Panel: Recent Labs  Lab 08/12/24 1906 08/12/24 1912 08/13/24 0154 08/14/24 0333 08/15/24 0306 08/16/24 1427 08/17/24 0305  NA 137   < > 136 135 136 138 138  K 4.9   < > 5.6* 5.2* 4.9 4.6 4.6  CL 101   < > 99 96* 101 102 104  CO2 25  --  25 24 24 27 24   GLUCOSE 101*   < > 129* 133* 121* 148* 171*  BUN 43*   < > 45* 72* 84* 75* 68*  CREATININE 1.43*   < > 1.56* 2.16* 1.84* 1.49* 1.33*  CALCIUM  10.6*  --  10.3 10.1 9.6 9.3 8.9  MG 2.4  --    --   --   --  2.3  --    < > = values in this interval not displayed.   GFR: Estimated Creatinine Clearance: 44.8 mL/min (A) (by C-G formula based on SCr of 1.33 mg/dL (H)). Liver Function Tests: Recent Labs  Lab 08/12/24 1906 08/17/24 0305  AST 19 15  ALT 6 7  ALKPHOS 121 126  BILITOT 0.4 0.3  PROT 7.0 5.8*  ALBUMIN 2.6* 2.5*   No results for input(s): LIPASE, AMYLASE in the last 168 hours. Recent Labs  Lab 08/16/24 1427  AMMONIA 25   Coagulation Profile: Recent Labs  Lab 08/15/24 0306  INR 1.2   Cardiac Enzymes: No results for input(s): CKTOTAL, CKMB, CKMBINDEX, TROPONINI in the last 168 hours. BNP (last 3 results) Recent Labs    08/12/24 1906  PROBNP 5,355.0*   HbA1C: No results for input(s): HGBA1C in the last 72 hours. CBG: No results for input(s): GLUCAP in the last 168 hours.  Lipid Profile: No results for input(s): CHOL, HDL, LDLCALC, TRIG, CHOLHDL, LDLDIRECT in the last 72 hours. Thyroid  Function Tests: Recent Labs    08/16/24 1426 08/16/24 1427  TSH 1.420  --   FREET4  --  1.11   Anemia Panel: No results for input(s): VITAMINB12, FOLATE, FERRITIN, TIBC, IRON, RETICCTPCT in the last 72 hours. Sepsis Labs: Recent Labs  Lab 08/12/24 1913 08/12/24 2053  LATICACIDVEN 2.1* 0.8    Recent Results (from the past 240 hours)  Culture, blood (Routine X 2) w Reflex to ID Panel     Status: None (Preliminary result)   Collection Time: 08/13/24  1:30 AM   Specimen: BLOOD LEFT ARM  Result Value Ref Range Status   Specimen Description BLOOD LEFT ARM  Final   Special Requests   Final    BOTTLES DRAWN AEROBIC AND ANAEROBIC Blood Culture adequate volume PATIENT ON FOLLOWING CEFEPIME  2 GRAMS,VANCOMYCIN  1250 MG   Culture   Final    NO GROWTH 4 DAYS Performed at [facility], [address], [place], [place] [address]    Report Status PENDING  Incomplete  Culture, blood (Routine X 2) w Reflex to ID Panel      Status: None (Preliminary result)   Collection Time: 08/13/24  1:49 AM   Specimen: BLOOD RIGHT HAND  Result Value Ref Range Status   Specimen Description BLOOD RIGHT HAND  Final   Special Requests   Final    BOTTLES DRAWN AEROBIC ONLY Blood Culture adequate volume PATIENT ON FOLLOWING CEFEPIME  2 GRAMS,VANCOMYCIN  1250 MG   Culture   Final    NO GROWTH 4 DAYS Performed at [facility], [address], [place], [place] [address]    Report Status PENDING  Incomplete  Resp panel by RT-PCR (RSV, Flu A&B, Covid) Anterior Nasal Swab     Status: None   Collection Time: 08/14/24  3:32 AM   Specimen: Anterior Nasal Swab  Result Value Ref Range Status   SARS Coronavirus 2 by RT PCR NEGATIVE NEGATIVE Final   Influenza A by PCR NEGATIVE NEGATIVE Final   Influenza B by PCR NEGATIVE NEGATIVE Final    Comment: (NOTE) The Xpert Xpress SARS-CoV-2/FLU/RSV plus assay is intended as an aid in the diagnosis of influenza from Nasopharyngeal swab specimens and should not be used as a sole basis for treatment. Nasal washings and aspirates are unacceptable for Xpert Xpress SARS-CoV-2/FLU/RSV testing.  Fact Sheet for Patients: bloggercourse.com  Fact Sheet for Healthcare Providers: seriousbroker.it  This test is not yet approved or cleared by the [place]  FDA and has been authorized for detection and/or diagnosis of SARS-CoV-2 by FDA under an Emergency Use Authorization (EUA). This EUA will remain in effect (meaning this test can be used) for the duration of the COVID-19 declaration under Section 564(b)(1) of the Act, 21 U.S.C. section 360bbb-3(b)(1), unless the authorization is terminated or revoked.     Resp Syncytial Virus by PCR NEGATIVE NEGATIVE Final    Comment: (NOTE) Fact Sheet for Patients: bloggercourse.com  Fact Sheet for Healthcare Providers: seriousbroker.it  This test is not  yet approved or cleared by the [place]  FDA and has been authorized for detection and/or diagnosis of SARS-CoV-2 by FDA under an Emergency Use Authorization (EUA). This EUA will remain in effect (meaning this test can be used) for the duration of the COVID-19 declaration under Section 564(b)(1) of the Act, 21 U.S.C. section 360bbb-3(b)(1), unless the authorization is terminated or revoked.  Performed at [facility], [address], [place], [place] [address]          Radiology Studies: No results found.      Scheduled Meds:  antiseptic oral rinse  15 mL Mouth Rinse Q4H   budesonide -glycopyrrolate -formoterol   2 puff Inhalation BID   dexamethasone   4 mg Oral Q8H   famotidine   40 mg Oral BID   feeding supplement  237 mL Oral BID BM   saccharomyces boulardii  250 mg Oral BID   senna-docusate  1 tablet Oral QHS   Continuous Infusions:  ferric gluconate (FERRLECIT ) IVPB Stopped (08/17/24 1751)     LOS: 6 days   Time spent:  Elsie JAYSON Montclair, DO [facility]  If 7PM-7AM, please contact night-coverage [email or web address]  08/18/2024, 7:47 AM      "

## 2024-08-18 NOTE — Progress Notes (Signed)
 "                                                                                                                                                                                                          Daily Progress Note   Patient Name: Jeffrey Norton       Date: 08/18/2024 DOB: [birth year]  Age: 74 y.o. MRN#: [id number] Attending Physician: Lue Elsie BROCKS, MD Primary Care Physician: Dayna Motto, DO Admit Date: 08/12/2024  Reason for Consultation/Follow-up: Establishing goals of care, Non pain symptom management, and Pain control  Subjective:  Awake alert attempting to feed himself breakfast wife at bedside Patient recalls meeting with oncology as well as PCCM earlier this am.   Length of Stay: 6  Current Medications: Scheduled Meds:   antiseptic oral rinse  15 mL Mouth Rinse Q4H   budesonide -glycopyrrolate -formoterol   2 puff Inhalation BID   dexamethasone   4 mg Oral Q8H   famotidine   40 mg Oral BID   feeding supplement  237 mL Oral BID BM   saccharomyces boulardii  250 mg Oral BID   senna-docusate  1 tablet Oral QHS    Continuous Infusions:  ferric gluconate (FERRLECIT ) IVPB Stopped (08/17/24 1751)    PRN Meds: acetaminophen  **OR** acetaminophen , ondansetron  (ZOFRAN ) IV  Physical Exam         Awake alert Appears weak Regular work of breathing Appears chronically ill Regular work of breathing  Vital Signs: BP 130/68 (BP Location: Left Arm)   Pulse 76   Temp 97.7 F (36.5 C) (Oral)   Resp 20   Ht 5' 9 (1.753 m)   Wt 64 kg   SpO2 98%   BMI 20.84 kg/m  SpO2: SpO2: 98 % O2 Device: O2 Device: Nasal Cannula O2 Flow Rate: O2 Flow Rate (L/min): 2 L/min  Intake/output summary:  Intake/Output Summary (Last 24 hours) at 08/18/2024 1052 Last data filed at 08/18/2024 0500 Gross per 24 hour  Intake 775.4 ml  Output 750 ml  Net 25.4 ml   LBM: Last BM Date : 08/17/24 Baseline Weight: Weight: 66.3 kg Most recent weight: Weight: 64 kg       Palliative  Assessment/Data:      Patient Active Problem List   Diagnosis Date Noted   Metastatic primary lung cancer (HCC) 08/13/2024   Sepsis (HCC) 08/13/2024   Elevated troponin 08/13/2024   Metastasis to brain (HCC) 08/12/2024   AKI (acute kidney injury) 03/05/2023   CHF exacerbation (HCC) 03/04/2023   Acute on chronic systolic heart failure (HCC) 03/04/2023   CKD (chronic  kidney disease) stage 3, GFR 30-59 ml/min (HCC) 03/04/2023   Unstable angina (HCC) 02/22/2023   ACS (acute coronary syndrome) (HCC) 02/22/2023   Atrial flutter (HCC) 07/08/2022   HFrEF (heart failure with reduced ejection fraction) (HCC) 07/08/2022   Atrial fibrillation (HCC) 07/07/2022   Non-ST elevation (NSTEMI) myocardial infarction [facility]) 07/07/2022   NSTEMI (non-ST elevated myocardial infarction) (HCC) 07/07/2022   Hyperlipidemia 04/25/2013   Claudication of lower extremity 08/15/2012   HTN (hypertension) 08/15/2012   CAD Inferior wall MI 08/2001, RCA PTA/DES with ISR - DES 12/05 at [facility]. 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 CTA chest (1/17): Enlarging 16.4 cm right upper lobe mass; no PE. CT A/P (1/17): Enlarging right adrenal mass measuring 6.1  4.9 cm. S/p CT-guided biopsy of right adrenal mass; pathology pending. Medical oncology consulted; significant concern that patient would not tolerate or meaningfully respond to chemotherapy given frailty and low pre-albumin level. Radiation oncology plan: Chest radiation simulation today with plan to start XRT on 1/23. Separate simulation planned next week for brain stereotactic  radiosurgery [facility]), with treatment to begin next week. Transferred to [facility] for radiation therapy.  PMT to continue multidisciplinary discussions as biopsy results return and as treatment tolerance is reassessed.   Acute Metabolic Encephalopathy Secondary to Brain Metastases with Vasogenic Edema Improved following initiation of steroids for CNS edema. Neurosurgery recommends stereotactic radiation rather than craniotomy. Continue steroids as directed; monitor mental status.  Acute Kidney Injury on CKD Stage IIIa Likely hemodynamically mediated. Baseline creatinine ~1.6; peaked at 2.1 on admission, now improved to 1.04 with hydration. Continue to monitor renal function; avoid nephrotoxins.   Disposition / Ongoing Care Continue inpatient management during radiation planning and initiation. Await biopsy results to further guide oncologic decision-making.  Code Status:    Code Status Orders  (From admission, onward)           Start     Ordered   08/13/24 0022  Do not attempt resuscitation (DNR)- Limited -Do Not Intubate (DNI)  Continuous       Question Answer Comment  If pulseless and not breathing No CPR or chest compressions.   In Pre-Arrest Conditions (Patient Is Breathing and Has A Pulse) Do not intubate. Provide all appropriate non-invasive medical interventions. Avoid ICU transfer unless indicated or required.   Consent: Discussion documented in EHR or advanced directives reviewed      08/13/24 0025           Code Status History     Date Active Date Inactive Code Status Order ID Comments User Context   08/13/2024 0014 08/13/2024 0025 Limited: Do not attempt resuscitation (DNR) -DNR-LIMITED -Do Not Intubate/DNI  [id number]  Alfornia Madison, MD ED   03/04/2023 0530 03/06/2023 1836 Full Code [id number]  Laveda Roosevelt, MD ED   02/22/2023 1110 02/24/2023 0257 Full Code [id number]  Barrett, Shona MATSU, PA-C ED   07/07/2022 1907 07/08/2022 2127 Full Code [id number]   Jadine Aline BRAVO, PA-C Inpatient   07/07/2022 1619 07/07/2022 1907 Full Code [id number]  Jordan, Peter M, MD Inpatient       Prognosis:  Unable to determine  Discharge Planning: To Be Determined  Care plan was discussed with  patient and wife.   Thank you for allowing the Palliative Medicine Team to assist in the care of this patient. I personally spent a total of 50 minutes in the care of the patient today including preparing to see the patient, getting/reviewing separately obtained history, performing a medically appropriate exam/evaluation, counseling and educating, referring and communicating with other health care professionals, and coordinating care.      Greater than 50%  of this time was spent counseling and coordinating care related to the above assessment and plan.  Lonia Serve, MD  Please contact Palliative Medicine Team phone at [phone] for questions and concerns.       "

## 2024-08-18 NOTE — Plan of Care (Signed)
   Problem: Clinical Measurements: Goal: Will remain free from infection Outcome: Progressing   Problem: Coping: Goal: Level of anxiety will decrease Outcome: Progressing   Problem: Pain Managment: Goal: General experience of comfort will improve and/or be controlled Outcome: Progressing   Problem: Skin Integrity: Goal: Risk for impaired skin integrity will decrease Outcome: Progressing

## 2024-08-18 NOTE — Progress Notes (Signed)
 Jeffrey Norton is now over at [facility].  It looks like he may start radiation therapy today.  There is still no pathology back.  Hopefully, we can get some preliminary result today.  I think the real problem that we are going to have is is very low prealbumin level.  His prealbumin is only 9.  This is really an ominous prognostic factor.  I talked with his wife over the phone about the prealbumin level and the significance.  I told her that with the level that is low, I would be very concerned that he would not be able to tolerate chemotherapy and that he would even respond to chemotherapy.  I also told her that when I have seen the patient with metastatic cancer with a prealbumin less than 10, I have not seen him survive more than 6 weeks or so.  She understands this.  Again I am not sure what she is really eating.  I am not sure how much he is able to absorb.  There is no pain.  We have him on steroids to try to help with CNS edema from his tumors.  There is no bleeding.  He is getting iron infusions.  There is no labs yet today.  Again, the pathology I think is good to be critical.  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  I do not think that he would respond to systemic therapy and will have a difficult time tolerating systemic therapy.  I am glad that he is overall 6 E.  I know the staff will do a fantastic job with him.  I just wish for him to have comfort, respect, and dignity.  Jeralyn Crease, MD  Romans 5:3-5

## 2024-08-18 NOTE — Progress Notes (Signed)
 Physical Therapy Treatment Patient Details Name: Jeffrey Norton [name] DOB: 15-Dec-1950 Today's Date: 08/18/2024   History of Present Illness 74 y.o. male presents 08/12/24 for acute onset AMS, aphasia, and generalized weakness. CT of the head showed multiple brain mass in the left frontal right occipital and right frontal lobe with vasogenic edema. Underwent IR biopsy of adrenal gland 1/20. PMH: CAD status post multiple PCIs, paroxysmal A-fib/flutter for which patient had previously declined anticoagulation, HFmrEF, pericarditis/pericardial effusion, CKD stage IIIa, HTN, HLD, PVD, GERD, GI bleed, chronic hypoxemic respiratory failure on 2 L home O2. 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 Pt ambulated short distance in hallway and currently requiring min assist.  Pt's SPO2 monitored as well as spouse reports pt did not require oxygen  prior to admission.  SATURATION QUALIFICATIONS: (This note is used to comply with regulatory documentation for home oxygen )  Patient Saturations on Room Air at Rest = 96%  Patient Saturations on Room Air while Ambulating = 86%  Patient Saturations on 3 Liters of oxygen  while Ambulating = 92%  Please briefly explain why patient needs home oxygen : to improve oxygen  saturations with ADLs and ambulation   If plan is discharge home, recommend the following: A little help with walking and/or transfers;A little help with bathing/dressing/bathroom;Assistance with cooking/housework;Assist for transportation;Help with stairs or ramp for entrance   Can travel by private vehicle        Equipment Recommendations  Rolling walker (2 wheels);BSC/3in1;Other (comment) (shower chair)     Recommendations for Other Services       Precautions / Restrictions Precautions Precautions: Fall Precaution/Restrictions Comments: currently on 3L O2 [place]     Mobility  Bed Mobility Overal bed mobility: Needs Assistance Bed Mobility: Supine to Sit     Supine to sit: Min assist, HOB elevated, Used rails     General bed mobility comments: Increased time to complete transfer, initiated movement but then requested assist for trunk    Transfers Overall transfer level: Needs assistance Equipment used: Rolling walker (2 wheels) Transfers: Sit to/from Stand Sit to Stand: Min assist, +2 safety/equipment           General transfer comment: multimodal cues for positioning and technique    Ambulation/Gait Ambulation/Gait assistance: Min assist, +2 safety/equipment Gait Distance (Feet): 50 Feet Assistive device: Rolling walker (2 wheels) Gait Pattern/deviations: Step-through pattern, Decreased stride length Gait velocity: decr     General Gait Details: verbal cues for posture and breathing; required supplemental oxygen , pt denies SOB and dizziness, 3 standing rest breaks due to fatigue   Stairs             Wheelchair Mobility     Tilt Bed    Modified Rankin (Stroke Patients Only)       Balance                                            Communication Communication Communication: No apparent difficulties  Cognition Arousal: Alert Behavior During Therapy: Flat affect   PT - Cognitive impairments: Attention, Awareness, Safety/Judgement, Problem solving  Following commands: Impaired Following commands impaired: Follows multi-step commands with increased time, Follows one step commands with increased time    Cueing Cueing Techniques: Verbal cues, Visual cues, Tactile cues  Exercises      General Comments        Pertinent Vitals/Pain Pain Assessment Pain Assessment: Faces Faces Pain Scale: Hurts  little more Pain Location: no pain during mobility Pain Descriptors / Indicators: Grimacing Pain Intervention(s): Repositioned, Monitored during session    Home Living                          Prior Function            PT Goals (current goals can now be found in the care plan section) Progress towards PT goals: Progressing toward goals    Frequency    Min 2X/week      PT Plan      Co-evaluation              AM-PAC PT 6 Clicks Mobility   Outcome Measure  Help needed turning from your back to your side while in a flat bed without using bedrails?: A Little Help needed moving from lying on your back to sitting on the side of a flat bed without using bedrails?: A Little Help needed moving to and from a bed to a chair (including a wheelchair)?: A Lot Help needed standing up from a chair using your arms (e.g., wheelchair or bedside chair)?: A Lot Help needed to walk in hospital room?: A Lot Help needed climbing 3-5 steps with a railing? : A Lot 6 Click Score: 14    End of Session   Activity Tolerance: Patient limited by fatigue Patient left: with call bell/phone within reach;with family/visitor present;in chair Nurse Communication: Mobility status PT Visit Diagnosis: Unsteadiness on feet (R26.81);Muscle weakness (generalized) (M62.81);Difficulty in walking, not elsewhere classified (R26.2)     Time: 8388-8361 PT Time Calculation (min) (ACUTE ONLY): 27 min  Charges:    $Gait Training: 23-37 mins PT General Charges $$ ACUTE PT VISIT: 1 Visit                     Tari PT, DPT Physical Therapist Acute Rehabilitation Services Office: [phone]    Tari CROME Payson 08/18/2024, 4:53 PM

## 2024-08-18 NOTE — Progress Notes (Signed)
 Spoke with the patient's wife and communicated that the biopsy results did confirm adenocarcinoma consistent with lung cancer primary.  Radiation has begun and will continue into next week.

## 2024-08-18 NOTE — Progress Notes (Signed)
 "  NAME:  Jeffrey Norton, MRN:  [id number], DOB:  [birth year], LOS: 6 ADMISSION DATE:  08/12/2024, CONSULTATION DATE:  08/18/24 REFERRING MD:  Dr. Odell Castor, CHIEF COMPLAINT:  Lung mass   History of Present Illness:   [name] is a 74 year old male with a history of coronary artery disease status post multiple PCIs, paroxysmal AFib for which he previously declined anticoagulation, HFrEF, chronic kidney disease, hypertension, history of GI bleeds, and chronic hypoxic respiratory failure on two liters who presents for changes in mental status now found to have multiple brain masses in the left frontal right occipital and right frontal lobe with vasogenic edema likely due to known lung mass with adrenal metastasis. Pulmonology consulted for evaluation and management of the latter.  The patient is a 74 year old with coronary artery disease and chronic hypoxic respiratory failure who presents with changes in mental status and confusion.  He presented to the emergency department due to changes in mental status and confusion. A CT scan of the head revealed multiple brain masses in the left frontal, right occipital, and right frontal lobes with vasogenic edema. He has been on steroids for brain swelling.  He has a known lung mass in the right upper lobe, measuring 15 cm by 8 cm, identified on May 25, 2024. A PET CT on July 12, 2024, showed the mass to be hypermetabolic with mediastinal and hilar adenopathy bilaterally. He was scheduled for a biopsy of the lung mass by interventional radiology, but it was delayed. He has been off Plavix  and aspirin  for five days in preparation for the biopsy. He has experienced weight loss and has a history of hemoptysis, which initially led to the discovery of the lung mass.  He has a history of coronary artery disease with multiple percutaneous coronary interventions, paroxysmal atrial fibrillation for which he previously declined anticoagulation, heart  failure with reduced ejection fraction, pericardial effusion, chronic kidney disease, hypertension, history of gastrointestinal bleeds, and chronic hypoxic respiratory failure on two liters of oxygen . He is currently on two liters of oxygen  and has been experiencing confusion, which prompted the emergency visit.  He has a family history of lung cancer, specifically mentioning a relative named Jeffrey Norton. He has a history of smoking.  Pertinent  Medical History   Past Medical History:  Diagnosis Date   Coronary artery disease    Hyperlipidemia    Hypertension    Myocardial infarction [facility])    PVD (peripheral vascular disease)    S/P angioplasty with stent to Lt. iliac 08/15/12 08/15/2012    Significant Hospital Events: Including procedures, antibiotic start and stop dates in addition to other pertinent events   1/19: Admit for brain mets with vasogenic edema 1/20: underwent IR bx of adrenal mass 1/23: Biopsy results pending  Interim History / Subjective:  Denies any significant complaints at present Still forgetful but feels comfortable Objective    Blood pressure 130/68, pulse 76, temperature 97.7 F (36.5 C), temperature source Oral, resp. rate 20, height 5' 9 (1.753 m), weight 64 kg, SpO2 98%.        Intake/Output Summary (Last 24 hours) at 08/18/2024 9076 Last data filed at 08/18/2024 0500 Gross per 24 hour  Intake 775.4 ml  Output 750 ml  Net 25.4 ml   Filed Weights   08/15/24 0500 08/18/24 0500  Weight: 66.3 kg 64 kg    Examination: General: Elderly frail, does not appear to be in distress HENT: Moist oral mucosa Lungs: Decreased  breath sounds bilaterally Cardiovascular: S1-S2 appreciated Abdomen: Soft, bowel sounds appreciated Extremities: No edema, no clubbing Neuro: Oriented to person place  GU: deferred  Radiology: Head CT: Multiple intracranial masses in the left frontal, right occipital, and right frontal lobes with associated vasogenic edema Chest  CT (05/25/2024): Right upper lobe mass measuring 15 cm x 8 cm PET CT (07/12/2024): Hypermetabolic right upper lobe mass with bilateral mediastinal and hilar lymphadenopathy Brain MRI: Left frontal lobe mass 3 cm x 3 cm with moderate vasogenic edema; right occipital lobe mass 1.7 cm x 1 cm with vasogenic edema Abdominal imaging: Enlarging right adrenal mass measuring 6.1 cm x 4.9 cm  Resolved problem list   Assessment and Plan   Right upper lobe mass - Adrenal mass was biopsied - Results pending - Oncology already following  Brain metastasis with cerebral edema - On steroids - Radiation oncology consulted for brain radiation-plan is to start radiation soon  Large right abdominal mass - Biopsy results pending  Chronic hypoxic respiratory failure - Continue oxygen  supplementation - Continue supplemental oxygen  at 2 liters.  His questions were answered Family member at bedside  Jennet Epley, MD [place] [facility] Pager: See Amion     "

## 2024-08-18 NOTE — Progress Notes (Signed)
 Called pathology for preliminary read. Message left for pathologist to return call. Per Dr Timmy, he would like to know small cell vs NSC.  Pathology resulted to chart and reviewed with Dr Timmy. Patient continues to be admitted. Will follow for treatment plan.   Oncology Nurse Navigator Documentation     08/18/2024   12:30 PM  Oncology Nurse Navigator Flowsheets  Confirmed Diagnosis Date 08/15/2024  Navigator Follow Up Date: 08/21/2024  Navigator Follow Up Reason: Appointment Review  Navigator Location [facility]  Navigator Encounter Type Pathology Review  Patient Visit Type MedOnc  Treatment Phase Pre-Tx/Tx Discussion  Barriers/Navigation Needs Coordination of Care;Education  Interventions Coordination of Care  Acuity Level 2-Minimal Needs (1-2 Barriers Identified)  Coordination of Care Pathology  Support Groups/Services Friends and Family  Time Spent with Patient 15

## 2024-08-19 DIAGNOSIS — C7931 Secondary malignant neoplasm of brain: Secondary | ICD-10-CM | POA: Diagnosis not present

## 2024-08-19 LAB — CBC WITH DIFFERENTIAL/PLATELET
Abs Immature Granulocytes: 0.27 10*3/uL — ABNORMAL HIGH (ref 0.00–0.07)
Basophils Absolute: 0 10*3/uL (ref 0.0–0.1)
Basophils Relative: 0 %
Eosinophils Absolute: 0 10*3/uL (ref 0.0–0.5)
Eosinophils Relative: 0 %
HCT: 30.7 % — ABNORMAL LOW (ref 39.0–52.0)
Hemoglobin: 9.1 g/dL — ABNORMAL LOW (ref 13.0–17.0)
Immature Granulocytes: 1 %
Lymphocytes Relative: 2 %
Lymphs Abs: 0.4 10*3/uL — ABNORMAL LOW (ref 0.7–4.0)
MCH: 28.5 pg (ref 26.0–34.0)
MCHC: 29.6 g/dL — ABNORMAL LOW (ref 30.0–36.0)
MCV: 96.2 fL (ref 80.0–100.0)
Monocytes Absolute: 1 10*3/uL (ref 0.1–1.0)
Monocytes Relative: 4 %
Neutro Abs: 25.1 10*3/uL — ABNORMAL HIGH (ref 1.7–7.7)
Neutrophils Relative %: 93 %
Platelets: 281 10*3/uL (ref 150–400)
RBC: 3.19 MIL/uL — ABNORMAL LOW (ref 4.22–5.81)
RDW: 15.2 % (ref 11.5–15.5)
WBC: 26.9 10*3/uL — ABNORMAL HIGH (ref 4.0–10.5)
nRBC: 0 % (ref 0.0–0.2)

## 2024-08-19 LAB — COMPREHENSIVE METABOLIC PANEL WITH GFR
ALT: 9 U/L (ref 0–44)
AST: 21 U/L (ref 15–41)
Albumin: 2.6 g/dL — ABNORMAL LOW (ref 3.5–5.0)
Alkaline Phosphatase: 124 U/L (ref 38–126)
Anion gap: 7 (ref 5–15)
BUN: 47 mg/dL — ABNORMAL HIGH (ref 8–23)
CO2: 25 mmol/L (ref 22–32)
Calcium: 9.6 mg/dL (ref 8.9–10.3)
Chloride: 103 mmol/L (ref 98–111)
Creatinine, Ser: 1.04 mg/dL (ref 0.61–1.24)
GFR, Estimated: >60 mL/min
Glucose, Bld: 119 mg/dL — ABNORMAL HIGH (ref 70–99)
Potassium: 5.4 mmol/L — ABNORMAL HIGH (ref 3.5–5.1)
Sodium: 135 mmol/L (ref 135–145)
Total Bilirubin: 0.4 mg/dL (ref 0.0–1.2)
Total Protein: 6 g/dL — ABNORMAL LOW (ref 6.5–8.1)

## 2024-08-19 NOTE — Progress Notes (Signed)
 " PROGRESS NOTE    Jeffrey Norton  FMW:[id number] DOB: 09-12-1950 DOA: 08/12/2024 PCP: Dayna Motto, DO  Brief Narrative:  Patient is a 74 y.o.  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  Assessment & Plan:   Principal Problem:   Metastasis to brain [facility]) Active Problems:   CHF exacerbation (HCC)   Metastatic primary lung cancer (HCC)   Sepsis (HCC)   Elevated troponin  Goals of care  - Given due to biopsy results of poorly differentiated adenocarcinoma and no option for chemotherapy oncology recommending ongoing radiation treatment as well as discussion with palliative care and hospice. - Discussed with patient's family and patient at bedside today, continue to be hopeful for improvement with radiation.  We discussed possible discharge home once his symptoms are more well-controlled and he is able to transport to radiation from home which may be difficult in the setting of transport  Acute metabolic encephalopathy secondary to brain mets with hemorrhagic change/vasogenic edema, resolving Encephalopathy has improved markedly after initiation of steroids, approaching baseline Neurosurgery recommending radiation treatment, no indication at this time for craniotomy  Transferred to [facility] for radiation therapy to start 1/23   Presumed stage IV bronchogenic carcinoma with brain/adrenal mets S/p CT-guided right adrenal mass biopsy obtained 1/20, pathology pending Medical oncology following, appreciate insight recommendations Radiation oncology following as above   AKI on CKD stage IIIa, resolved AKI likely hemodynamically mediated given improvement with volume replacement Previous baseline around 1.6 -currently within normal limits   Chronic hypoxic respiratory  failure In the setting of COPD/lung mass. Chronically on 2 L nasal cannula at rest and with exertion   COPD Not in exacerbation Continue bronchodilators/supportive care   Chronic HFrEF Euvolemic.  Tolerated IV fluids well, follow clinically -holding any diuretics in the setting of AKI as above   History of CAD s/p multiple PCI-most recently in 2024 Without notable anginal symptoms Hold home aspirin  secondary to anemia, no notable source of bleeding at this time   PAF Previously has refused anticoagulation Remains rate controlled off any rate control medications   PAD Asymptomatic, aspirin  on hold as above   Dysphagia SLP following. Advancing diet as tolerated -currently on regular diet thin liquids   Iron deficiency anemia. Hemoglobin stable No active bleeding reported. Iron level significantly low -IV iron completed  Asterixis. Resolved.  Suspect secondary to uremia - now resolved TSH 1.4 WNL - free T41.1 Ammonia 25.  DVT prophylaxis: SCDs Start: 08/13/24 0021 Code Status:   Code Status: Limited: Do not attempt resuscitation (DNR) -DNR-LIMITED -Do Not Intubate/DNI  Family Communication: At bedside, wife  Status is: Inpatient  Dispo: The patient is from: Home              Anticipated d/c is to: To be determined              Anticipated d/c date is: To be determined              Patient currently not medically stable for discharge  Consultants:  Radiation oncology Medication oncology Neurosurgery Palliative care Interventional radiology  Procedures:  1/20>> CT-guided right adrenal mass biopsy by IR 1/20>> Echo 45 to 50%, grade 1 diastolic dysfunction hypokinesis of left ventricle, basal mid inferior wall, inferolateral and inferoseptal wall with reduced RV function and possible PHTN moderate mitral regurg  unchanged echo from before other than worsening mitral regurg.  Imaging of note 1/17>> MRI brain: 3 contrast-enhancing metastatic lesions in the  brain. 1/17>> CT abdomen/pelvis: Enlarging 6.1 x 4.9 cm right adrenal mass 1/17>> CTA head/neck: Moderate stenosis of left vertebral artery-less than 50% stenosis of right carotid, approximately 50% stenosis of left carotid artery. 1/17>> CTA chest: No PE, enlarging 16.4 cm right upper lobe mass.  Antimicrobials:  None  Subjective: No acute issues or events overnight, improving appetite -denies nausea vomiting diarrhea constipation headache fevers chills or chest pain  Objective: Vitals:   08/18/24 1237 08/18/24 1502 08/18/24 1900 08/19/24 0352  BP:  134/77  130/79  Pulse:  72  69  Resp:  18  18  Temp:  97.7 F (36.5 C)  97.9 F (36.6 C)  TempSrc:  Oral  Oral  SpO2: 96% 100% 99% 100%  Weight:      Height:        Intake/Output Summary (Last 24 hours) at 08/19/2024 0741 Last data filed at 08/19/2024 0500 Gross per 24 hour  Intake 480 ml  Output 1550 ml  Net -1070 ml   Filed Weights   08/15/24 0500 08/18/24 0500  Weight: 66.3 kg 64 kg    Examination:  General:  Pleasantly resting in bed, No acute distress. HEENT:  Normocephalic atraumatic.  Sclerae nonicteric, noninjected.  Extraocular movements intact bilaterally. Neck:  Without mass or deformity.  Trachea is midline. Lungs:  Clear to auscultate bilaterally without rhonchi, wheeze, or rales. Heart:  Regular rate and rhythm.  Without murmurs, rubs, or gallops. Abdomen:  Soft, nontender, nondistended.  Without guarding or rebound. Extremities: Without cyanosis, clubbing, edema, or obvious deformity. Skin:  Warm and dry, no erythema.   Data Reviewed: I have personally reviewed following labs and imaging studies  CBC: Recent Labs  Lab 08/12/24 1906 08/12/24 1912 08/14/24 0917 08/17/24 0305 08/17/24 1339 08/18/24 0835 08/19/24 0550  WBC 16.5*   < > 23.9* 22.9* 25.0* 26.4* 26.9*  NEUTROABS 14.5*  --   --  21.2*  --   --  25.1*  HGB 9.8*   < > 10.0* 8.6* 8.9* 9.1* 9.1*  HCT 32.1*   < > 32.4* 27.9* 28.8* 30.5*  30.7*  MCV 96.1   < > 94.7 93.0 94.1 95.9 96.2  PLT 342   < > 337 279 285 301 281   < > = values in this interval not displayed.   Basic Metabolic Panel: Recent Labs  Lab 08/12/24 1906 08/12/24 1912 08/15/24 0306 08/16/24 1427 08/17/24 0305 08/18/24 0835 08/19/24 0550  NA 137   < > 136 138 138 137 135  K 4.9   < > 4.9 4.6 4.6 4.9 5.4*  CL 101   < > 101 102 104 105 103  CO2 25   < > 24 27 24 25 25   GLUCOSE 101*   < > 121* 148* 171* 120* 119*  BUN 43*   < > 84* 75* 68* 51* 47*  CREATININE 1.43*   < > 1.84* 1.49* 1.33* 1.04 1.04  CALCIUM  10.6*   < > 9.6 9.3 8.9 9.6 9.6  MG 2.4  --   --  2.3  --   --   --    < > = values in this interval not displayed.   GFR: Estimated Creatinine Clearance: 57.3 mL/min (by C-G formula based on SCr of 1.04 mg/dL). Liver Function Tests: Recent Labs  Lab 08/12/24 1906 08/17/24 0305 08/19/24 0550  AST 19 15  21  ALT 6 7 9   ALKPHOS 121 126 124  BILITOT 0.4 0.3 0.4  PROT 7.0 5.8* 6.0*  ALBUMIN 2.6* 2.5* 2.6*   No results for input(s): LIPASE, AMYLASE in the last 168 hours. Recent Labs  Lab 08/16/24 1427  AMMONIA 25   Coagulation Profile: Recent Labs  Lab 08/15/24 0306  INR 1.2   Cardiac Enzymes: No results for input(s): CKTOTAL, CKMB, CKMBINDEX, TROPONINI in the last 168 hours. BNP (last 3 results) Recent Labs    08/12/24 1906  PROBNP 5,355.0*   HbA1C: No results for input(s): HGBA1C in the last 72 hours. CBG: No results for input(s): GLUCAP in the last 168 hours. Lipid Profile: No results for input(s): CHOL, HDL, LDLCALC, TRIG, CHOLHDL, LDLDIRECT in the last 72 hours. Thyroid  Function Tests: Recent Labs    08/16/24 1426 08/16/24 1427  TSH 1.420  --   FREET4  --  1.11   Anemia Panel: No results for input(s): VITAMINB12, FOLATE, FERRITIN, TIBC, IRON, RETICCTPCT in the last 72 hours. Sepsis Labs: Recent Labs  Lab 08/12/24 1913 08/12/24 2053  LATICACIDVEN 2.1* 0.8    Recent  Results (from the past 240 hours)  Culture, blood (Routine X 2) w Reflex to ID Panel     Status: None   Collection Time: 08/13/24  1:30 AM   Specimen: BLOOD LEFT ARM  Result Value Ref Range Status   Specimen Description BLOOD LEFT ARM  Final   Special Requests   Final    BOTTLES DRAWN AEROBIC AND ANAEROBIC Blood Culture adequate volume PATIENT ON FOLLOWING CEFEPIME  2 GRAMS,VANCOMYCIN  1250 MG   Culture   Final    NO GROWTH 5 DAYS Performed at [facility], [address], [place], [place] [address]    Report Status 08/18/2024 FINAL  Final  Culture, blood (Routine X 2) w Reflex to ID Panel     Status: None   Collection Time: 08/13/24  1:49 AM   Specimen: BLOOD RIGHT HAND  Result Value Ref Range Status   Specimen Description BLOOD RIGHT HAND  Final   Special Requests   Final    BOTTLES DRAWN AEROBIC ONLY Blood Culture adequate volume PATIENT ON FOLLOWING CEFEPIME  2 GRAMS,VANCOMYCIN  1250 MG   Culture   Final    NO GROWTH 5 DAYS Performed at [facility], [address] [place], [place] [address]    Report Status 08/18/2024 FINAL  Final  Resp panel by RT-PCR (RSV, Flu A&B, Covid) Anterior Nasal Swab     Status: None   Collection Time: 08/14/24  3:32 AM   Specimen: Anterior Nasal Swab  Result Value Ref Range Status   SARS Coronavirus 2 by RT PCR NEGATIVE NEGATIVE Final   Influenza A by PCR NEGATIVE NEGATIVE Final   Influenza B by PCR NEGATIVE NEGATIVE Final    Comment: (NOTE) The Xpert Xpress SARS-CoV-2/FLU/RSV plus assay is intended as an aid in the diagnosis of influenza from Nasopharyngeal swab specimens and should not be used as a sole basis for treatment. Nasal washings and aspirates are unacceptable for Xpert Xpress SARS-CoV-2/FLU/RSV testing.  Fact Sheet for Patients: bloggercourse.com  Fact Sheet for Healthcare Providers: seriousbroker.it  This test is not yet approved or cleared by the [place]  FDA  and has been authorized for detection and/or diagnosis of SARS-CoV-2 by FDA under an Emergency Use Authorization (EUA). This EUA will remain in effect (meaning this test can be used) for the duration of the COVID-19 declaration under Section 564(b)(1) of the  Act, 21 U.S.C. section 360bbb-3(b)(1), unless the authorization is terminated or revoked.     Resp Syncytial Virus by PCR NEGATIVE NEGATIVE Final    Comment: (NOTE) Fact Sheet for Patients: bloggercourse.com  Fact Sheet for Healthcare Providers: seriousbroker.it  This test is not yet approved or cleared by the [place]  FDA and has been authorized for detection and/or diagnosis of SARS-CoV-2 by FDA under an Emergency Use Authorization (EUA). This EUA will remain in effect (meaning this test can be used) for the duration of the COVID-19 declaration under Section 564(b)(1) of the Act, 21 U.S.C. section 360bbb-3(b)(1), unless the authorization is terminated or revoked.  Performed at [facility], [address], [place], [place] [address]          Radiology Studies: No results found.      Scheduled Meds:  antiseptic oral rinse  15 mL Mouth Rinse Q4H   budesonide -glycopyrrolate -formoterol   2 puff Inhalation BID   dexamethasone   4 mg Oral Q8H   famotidine   40 mg Oral BID   feeding supplement  237 mL Oral BID BM   saccharomyces boulardii  250 mg Oral BID   senna-docusate  1 tablet Oral QHS   Continuous Infusions:     LOS: 7 days   Time spent:  Elsie JAYSON Mont[place], DO [facility]  If 7PM-7AM, please contact night-coverage [email or web address]  08/19/2024, 7:41 AM      "

## 2024-08-19 NOTE — Plan of Care (Signed)

## 2024-08-19 NOTE — Plan of Care (Signed)
" °  Problem: Education: Goal: Knowledge of General Education information will improve Description: Including pain rating scale, medication(s)/side effects and non-pharmacologic comfort measures 08/19/2024 0439 by Martina Leory CROME, RN Outcome: Not Progressing 08/19/2024 0437 by Martina Leory CROME, RN Outcome: Progressing   Problem: Health Behavior/Discharge Planning: Goal: Ability to manage health-related needs will improve 08/19/2024 0439 by Martina Leory CROME, RN Outcome: Not Progressing 08/19/2024 0437 by Martina Leory CROME, RN Outcome: Progressing   Problem: Clinical Measurements: Goal: Ability to maintain clinical measurements within normal limits will improve 08/19/2024 0439 by Martina Leory CROME, RN Outcome: Not Progressing 08/19/2024 0437 by Martina Leory CROME, RN Outcome: Progressing Goal: Will remain free from infection 08/19/2024 0439 by Martina Leory CROME, RN Outcome: Not Progressing 08/19/2024 0437 by Martina Leory CROME, RN Outcome: Progressing Goal: Diagnostic test results will improve 08/19/2024 0439 by Martina Leory CROME, RN Outcome: Not Progressing 08/19/2024 0437 by Martina Leory CROME, RN Outcome: Progressing Goal: Respiratory complications will improve 08/19/2024 0439 by Martina Leory CROME, RN Outcome: Not Progressing 08/19/2024 0437 by Martina Leory CROME, RN Outcome: Progressing Goal: Cardiovascular complication will be avoided 08/19/2024 0439 by Martina Leory CROME, RN Outcome: Not Progressing 08/19/2024 0437 by Martina Leory CROME, RN Outcome: Progressing   Problem: Activity: Goal: Risk for activity intolerance will decrease 08/19/2024 0439 by Martina Leory CROME, RN Outcome: Not Progressing 08/19/2024 0437 by Martina Leory CROME, RN Outcome: Progressing   Problem: Nutrition: Goal: Adequate nutrition will be maintained 08/19/2024 0439 by Martina Leory CROME, RN Outcome: Not Progressing 08/19/2024 0437 by Martina Leory CROME, RN Outcome: Progressing   Problem: Coping: Goal: Level of anxiety will  decrease 08/19/2024 0439 by Martina Leory CROME, RN Outcome: Not Progressing 08/19/2024 0437 by Martina Leory CROME, RN Outcome: Progressing   Problem: Elimination: Goal: Will not experience complications related to bowel motility 08/19/2024 0439 by Martina Leory CROME, RN Outcome: Not Progressing 08/19/2024 0437 by Martina Leory CROME, RN Outcome: Progressing Goal: Will not experience complications related to urinary retention 08/19/2024 0439 by Martina Leory CROME, RN Outcome: Not Progressing 08/19/2024 0437 by Martina Leory CROME, RN Outcome: Progressing   Problem: Pain Managment: Goal: General experience of comfort will improve and/or be controlled 08/19/2024 0439 by Martina Leory CROME, RN Outcome: Not Progressing 08/19/2024 0437 by Martina Leory CROME, RN Outcome: Not Progressing   Problem: Safety: Goal: Ability to remain free from injury will improve 08/19/2024 0439 by Martina Leory CROME, RN Outcome: Not Progressing 08/19/2024 0437 by Martina Leory CROME, RN Outcome: Progressing   Problem: Skin Integrity: Goal: Risk for impaired skin integrity will decrease 08/19/2024 0439 by Martina Leory CROME, RN Outcome: Not Progressing 08/19/2024 0437 by Martina Leory CROME, RN Outcome: Progressing   "

## 2024-08-19 NOTE — Plan of Care (Signed)

## 2024-08-19 NOTE — Progress Notes (Signed)
 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  I am really not surprised by this.  Given the extent of his disease, this would make sense.  Unfortunately, I just do not believe that he is a candidate for systemic chemotherapy.  I does not believe that his performance status (ECOG 3) would really allow him to benefit from treatment.  He is getting radiation therapy right now.  This is reasonable as this would help quality of life.  I had a long talk with the and his stepdaughter.  I spoke to his wife on the phone.  I really believe that this is a situation where we need to get Hospice involved.  I does believe that Hospice would help he and the family quite a bit.  I think that Hospice would allow him to be at home.  If he has difficulties at home, then we can certainly get him over to [facility].  I asked [name] if he wanted no how long he had.  He did not wish to know this.  I understand this.  I did talk to his wife and I told her what I thought.  She understands.  Again, this is all about comfort, respect, and dignity.  I want to make sure that we focus on these aspects of his care now.  Again I do believe that radiation therapy would be beneficial to him.  I do not know if they might be able to shorten his course of radiotherapy.  Nutrition is still incredibly important.  I am does not sure that he is going to be able to eat all that much.  He is already a DNR.  This is certainly reasonable.  I really hate this for him.  However, I just believe this is a very aggressive malignancy that would have a difficult time to respond.  I just worry that the toxicity would far outweigh benefits.  I know that he is getting great care from everybody up on 6 E.  I do appreciate Radiation Oncology helping also.  When it is time for him to go home, I will call Hospice so they will be ready.  Again, if there are difficulties at home, then we can get him to  [facility].   Jeralyn Crease, MD  2 Timothy 4:6-8

## 2024-08-20 DIAGNOSIS — C7931 Secondary malignant neoplasm of brain: Secondary | ICD-10-CM | POA: Diagnosis not present

## 2024-08-20 MED ORDER — ORAL CARE MOUTH RINSE: 15.0000 mL | OROMUCOSAL | Status: DC | PRN

## 2024-08-20 NOTE — Plan of Care (Signed)
  Problem: Education: Goal: Knowledge of General Education information will improve Description: Including pain rating scale, medication(s)/side effects and non-pharmacologic comfort measures Outcome: Progressing   Problem: Clinical Measurements: Goal: Ability to maintain clinical measurements within normal limits will improve Outcome: Progressing   Problem: Coping: Goal: Level of anxiety will decrease Outcome: Progressing   

## 2024-08-20 NOTE — Plan of Care (Signed)

## 2024-08-20 NOTE — Progress Notes (Signed)
 "                                                                                                                                                                                                          Daily Progress Note   Patient Name: Jeffrey Norton       Date: 08/20/2024 DOB: [birth year]  Age: 74 y.o. MRN#: [id number] Attending Physician: Lue Elsie BROCKS, MD Primary Care Physician: Dayna Motto, DO Admit Date: 08/12/2024  Reason for Consultation/Follow-up: Establishing goals of care, Non pain symptom management, and Pain control  Subjective:  Awake alert resting in bed, appears with generalized weakness although he iis trying to increase his mobility and wishes to be more out of bed later on today.  Decent oral intake Wife at bedside Attempting to feed himself breakfast wife at bedside    Length of Stay: 8  Current Medications: Scheduled Meds:   antiseptic oral rinse  15 mL Mouth Rinse Q4H   budesonide -glycopyrrolate -formoterol   2 puff Inhalation BID   dexamethasone   4 mg Oral Q8H   famotidine   40 mg Oral BID   feeding supplement  237 mL Oral BID BM   saccharomyces boulardii  250 mg Oral BID   senna-docusate  1 tablet Oral QHS    Continuous Infusions:    PRN Meds: acetaminophen  **OR** acetaminophen , ondansetron  (ZOFRAN ) IV  Physical Exam         Awake alert Appears with generalized weakness Regular work of breathing Appears chronically ill Regular work of breathing  Vital Signs: BP 124/71 (BP Location: Left Arm)   Pulse 74   Temp (!) 97.5 F (36.4 C) (Oral)   Resp 20   Ht 5' 9 (1.753 m)   Wt 67.8 kg   SpO2 99%   BMI 22.07 kg/m  SpO2: SpO2: 99 % O2 Device: O2 Device: Nasal Cannula O2 Flow Rate: O2 Flow Rate (L/min): 2 L/min  Intake/output summary:  Intake/Output Summary (Last 24 hours) at 08/20/2024 1310 Last data filed at 08/20/2024 0358 Gross per 24 hour  Intake --  Output 1150 ml  Net -1150 ml   LBM: Last BM Date : 08/18/24 Baseline  Weight: Weight: 66.3 kg Most recent weight: Weight: 67.8 kg       Palliative Assessment/Data:      Patient Active Problem List   Diagnosis Date Noted   Metastatic primary lung cancer (HCC) 08/13/2024   Sepsis (HCC) 08/13/2024   Elevated troponin 08/13/2024   Metastasis to brain (HCC) 08/12/2024   AKI (acute kidney injury) 03/05/2023   CHF exacerbation (  HCC) 03/04/2023   Acute on chronic systolic heart failure (HCC) 03/04/2023   CKD (chronic kidney disease) stage 3, GFR 30-59 ml/min (HCC) 03/04/2023   Unstable angina (HCC) 02/22/2023   ACS (acute coronary syndrome) (HCC) 02/22/2023   Atrial flutter (HCC) 07/08/2022   HFrEF (heart failure with reduced ejection fraction) (HCC) 07/08/2022   Atrial fibrillation (HCC) 07/07/2022   Non-ST elevation (NSTEMI) myocardial infarction [facility]) 07/07/2022   NSTEMI (non-ST elevated myocardial infarction) (HCC) 07/07/2022   Hyperlipidemia 04/25/2013   Claudication of lower extremity 08/15/2012   HTN (hypertension) 08/15/2012   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 CTA chest (1/17): Enlarging 16.4 cm right upper lobe mass; no PE. CT A/P (1/17): Enlarging right adrenal mass measuring 6.1  4.9 cm. S/p CT-guided biopsy of right adrenal mass; pathology pending. Medical oncology consulted; significant concern that patient would not tolerate or meaningfully respond to chemotherapy given frailty and low pre-albumin level. Radiation oncology has started XRT on 1/23. Separate simulation  planned next week for brain stereotactic radiosurgery [facility]), with treatment to begin afterwards.     PMT to continue multidisciplinary discussions as treatment tolerance is reassessed.   Acute Metabolic Encephalopathy Secondary to Brain Metastases with Vasogenic Edema Improved following initiation of steroids for CNS edema. Neurosurgery has recommended stereotactic radiation rather than craniotomy. Continue steroids as directed; monitor mental status.  Acute Kidney Injury on CKD Stage IIIa Likely hemodynamically mediated. Baseline creatinine ~1.6; peaked at 2.1 on admission, now improved to 1.04 with hydration. Continue to monitor renal function; avoid nephrotoxins.   Disposition / Ongoing Care Continue inpatient management during radiation planning and initiation. Biopsy results did confirm adenocarcinoma consistent with lung cancer area.  Radiation is being done and a Palliative care to continue to follow hospital course closely.  Code Status:    Code Status Orders  (From admission, onward)           Start     Ordered   08/13/24 0022  Do not attempt resuscitation (DNR)- Limited -Do Not Intubate (DNI)  Continuous       Question Answer Comment  If pulseless and not breathing No CPR or chest compressions.   In Pre-Arrest Conditions (Patient Is Breathing and Has A Pulse) Do not intubate. Provide all appropriate non-invasive medical interventions. Avoid ICU transfer unless indicated or required.   Consent: Discussion documented in EHR or advanced directives reviewed      08/13/24 0025           Code Status History     Date Active Date Inactive Code Status Order ID Comments User Context   08/13/2024 0014 08/13/2024 0025 Limited: Do not attempt resuscitation (DNR) -DNR-LIMITED -Do Not Intubate/DNI  [id number]  Alfornia Madison, MD ED   03/04/2023 0530 03/06/2023 1836 Full Code [id number]  Laveda Roosevelt, MD ED   02/22/2023 1110 02/24/2023 0257 Full Code [id number]  Barrett,  Shona MATSU, PA-C ED   07/07/2022 1907 07/08/2022 2127 Full Code [id number]  Jadine Aline BRAVO, PA-C Inpatient   07/07/2022 1619 07/07/2022 1907 Full Code [id number]  Jordan, Peter M, MD  Inpatient       Prognosis:  Unable to determine  Discharge Planning: To Be Determined  Care plan was discussed with  patient and wife.   Thank you for allowing the Palliative Medicine Team to assist in the care of this patient. I personally spent a total of 35 minutes in the care of the patient today including preparing to see the patient, getting/reviewing separately obtained history, performing a medically appropriate exam/evaluation, counseling and educating, referring and communicating with other health care professionals, and coordinating care.      Greater than 50%  of this time was spent counseling and coordinating care related to the above assessment and plan.  Lonia Serve, MD  Please contact Palliative Medicine Team phone at [phone] for questions and concerns.       "

## 2024-08-20 NOTE — Progress Notes (Signed)
 " PROGRESS NOTE    Jeffrey Norton  FMW:[id number] DOB: [birth year] DOA: 08/12/2024 PCP: Dayna Motto, DO  Brief Narrative:  Patient is a 74 y.o.  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  Assessment & Plan:   Principal Problem:   Metastasis to brain [facility]) Active Problems:   CHF exacerbation (HCC)   Metastatic primary lung cancer (HCC)   Sepsis (HCC)   Elevated troponin  Goals of care  - Given due to biopsy results of poorly differentiated adenocarcinoma and no option for chemotherapy oncology recommending ongoing radiation treatment as well as discussion with palliative care and hospice. - Discussed with patient's family and patient at bedside today, continue to be hopeful for improvement with radiation.  We discussed possible discharge home once his symptoms are more well-controlled and he is able to transport to radiation from home which may be difficult in the setting of transport  Acute metabolic encephalopathy secondary to brain mets with hemorrhagic change/vasogenic edema, resolving Encephalopathy has improved markedly after initiation of steroids, approaching baseline Neurosurgery recommending radiation treatment, no indication at this time for craniotomy  Transferred to [facility] for radiation therapy to start 1/23   Presumed stage IV bronchogenic carcinoma with brain/adrenal mets S/p CT-guided right adrenal mass biopsy obtained 1/20, pathology pending Medical oncology following, appreciate insight recommendations Radiation oncology following as above   AKI on CKD stage IIIa, resolved AKI likely hemodynamically mediated given improvement with volume replacement Previous baseline around 1.6 -currently within normal limits   Chronic hypoxic respiratory  failure In the setting of COPD/lung mass. Chronically on 2 L nasal cannula at rest and with exertion   COPD Not in exacerbation Continue bronchodilators/supportive care   Chronic HFrEF Euvolemic.  Tolerated IV fluids well, follow clinically -holding any diuretics in the setting of AKI as above   History of CAD s/p multiple PCI-most recently in 2024 Without notable anginal symptoms Hold home aspirin  secondary to anemia, no notable source of bleeding at this time   PAF Previously has refused anticoagulation Remains rate controlled off any rate control medications   PAD Asymptomatic, aspirin  on hold as above   Dysphagia SLP following. Advancing diet as tolerated -currently on regular diet thin liquids   Iron deficiency anemia. Hemoglobin stable No active bleeding reported. Iron level significantly low -IV iron completed  Asterixis. Resolved.  Suspect secondary to uremia - now resolved TSH 1.4 WNL - free T41.1 Ammonia 25.  DVT prophylaxis: SCDs Start: 08/13/24 0021 Code Status:   Code Status: Limited: Do not attempt resuscitation (DNR) -DNR-LIMITED -Do Not Intubate/DNI  Family Communication: At bedside, wife  Status is: Inpatient  Dispo: The patient is from: Home              Anticipated d/c is to: To be determined              Anticipated d/c date is: To be determined              Patient currently not medically stable for discharge  Consultants:  Radiation oncology Medication oncology Neurosurgery Palliative care Interventional radiology  Procedures:  1/20>> CT-guided right adrenal mass biopsy by IR 1/20>> Echo 45 to 50%, grade 1 diastolic dysfunction hypokinesis of left ventricle, basal mid inferior wall, inferolateral and inferoseptal wall with reduced RV function and possible PHTN moderate mitral regurg  unchanged echo from before other than worsening mitral regurg.  Imaging of note 1/17>> MRI brain: 3 contrast-enhancing metastatic lesions in the  brain. 1/17>> CT abdomen/pelvis: Enlarging 6.1 x 4.9 cm right adrenal mass 1/17>> CTA head/neck: Moderate stenosis of left vertebral artery-less than 50% stenosis of right carotid, approximately 50% stenosis of left carotid artery. 1/17>> CTA chest: No PE, enlarging 16.4 cm right upper lobe mass.  Antimicrobials:  None  Subjective: No acute issues or events overnight, improving appetite -denies nausea vomiting diarrhea constipation headache fevers chills or chest pain  Objective: Vitals:   08/19/24 0859 08/19/24 1353 08/19/24 2018 08/20/24 0356  BP:  115/67 136/66 124/71  Pulse:  (!) 58 74 74  Resp:  16 20 20   Temp:  (!) 97.4 F (36.3 C) 97.6 F (36.4 C) (!) 97.5 F (36.4 C)  TempSrc:  Oral Oral Oral  SpO2: 99% 99% 93% 98%  Weight:      Height:        Intake/Output Summary (Last 24 hours) at 08/20/2024 0714 Last data filed at 08/20/2024 0358 Gross per 24 hour  Intake 30 ml  Output 1450 ml  Net -1420 ml   Filed Weights   08/15/24 0500 08/18/24 0500  Weight: 66.3 kg 64 kg    Examination:  General:  Pleasantly resting in bed, No acute distress. HEENT:  Normocephalic atraumatic.  Sclerae nonicteric, noninjected.  Extraocular movements intact bilaterally. Neck:  Without mass or deformity.  Trachea is midline. Lungs:  Clear to auscultate bilaterally without rhonchi, wheeze, or rales. Heart:  Regular rate and rhythm.  Without murmurs, rubs, or gallops. Abdomen:  Soft, nontender, nondistended.  Without guarding or rebound. Extremities: Without cyanosis, clubbing, edema, or obvious deformity. Skin:  Warm and dry, no erythema.   Data Reviewed: I have personally reviewed following labs and imaging studies  CBC: Recent Labs  Lab 08/14/24 0917 08/17/24 0305 08/17/24 1339 08/18/24 0835 08/19/24 0550  WBC 23.9* 22.9* 25.0* 26.4* 26.9*  NEUTROABS  --  21.2*  --   --  25.1*  HGB 10.0* 8.6* 8.9* 9.1* 9.1*  HCT 32.4* 27.9* 28.8* 30.5* 30.7*  MCV 94.7 93.0 94.1 95.9 96.2   PLT 337 279 285 301 281   Basic Metabolic Panel: Recent Labs  Lab 08/15/24 0306 08/16/24 1427 08/17/24 0305 08/18/24 0835 08/19/24 0550  NA 136 138 138 137 135  K 4.9 4.6 4.6 4.9 5.4*  CL 101 102 104 105 103  CO2 24 27 24 25 25   GLUCOSE 121* 148* 171* 120* 119*  BUN 84* 75* 68* 51* 47*  CREATININE 1.84* 1.49* 1.33* 1.04 1.04  CALCIUM  9.6 9.3 8.9 9.6 9.6  MG  --  2.3  --   --   --    GFR: Estimated Creatinine Clearance: 57.3 mL/min (by C-G formula based on SCr of 1.04 mg/dL). Liver Function Tests: Recent Labs  Lab 08/17/24 0305 08/19/24 0550  AST 15 21  ALT 7 9  ALKPHOS 126 124  BILITOT 0.3 0.4  PROT 5.8* 6.0*  ALBUMIN 2.5* 2.6*   No results for input(s): LIPASE, AMYLASE in the last 168 hours. Recent Labs  Lab 08/16/24 1427  AMMONIA 25   Coagulation Profile: Recent Labs  Lab 08/15/24 0306  INR 1.2   Cardiac Enzymes: No results for input(s): CKTOTAL, CKMB, CKMBINDEX, TROPONINI in the last 168 hours. BNP (last 3 results) Recent Labs    08/12/24 1906  PROBNP 5,355.0*   HbA1C: No results for input(s): HGBA1C in the last 72 hours.  CBG: No results for input(s): GLUCAP in the last 168 hours. Lipid Profile: No results for input(s): CHOL, HDL, LDLCALC, TRIG, CHOLHDL, LDLDIRECT in the last 72 hours. Thyroid  Function Tests: No results for input(s): TSH, T4TOTAL, FREET4, T3FREE, THYROIDAB in the last 72 hours.  Anemia Panel: No results for input(s): VITAMINB12, FOLATE, FERRITIN, TIBC, IRON, RETICCTPCT in the last 72 hours. Sepsis Labs: No results for input(s): PROCALCITON, LATICACIDVEN in the last 168 hours.   Recent Results (from the past 240 hours)  Culture, blood (Routine X 2) w Reflex to ID Panel     Status: None   Collection Time: 08/13/24  1:30 AM   Specimen: BLOOD LEFT ARM  Result Value Ref Range Status   Specimen Description BLOOD LEFT ARM  Final   Special Requests   Final    BOTTLES DRAWN  AEROBIC AND ANAEROBIC Blood Culture adequate volume PATIENT ON FOLLOWING CEFEPIME  2 GRAMS,VANCOMYCIN  1250 MG   Culture   Final    NO GROWTH 5 DAYS Performed at [facility], [address], [place], [place] [address]    Report Status 08/18/2024 FINAL  Final  Culture, blood (Routine X 2) w Reflex to ID Panel     Status: None   Collection Time: 08/13/24  1:49 AM   Specimen: BLOOD RIGHT HAND  Result Value Ref Range Status   Specimen Description BLOOD RIGHT HAND  Final   Special Requests   Final    BOTTLES DRAWN AEROBIC ONLY Blood Culture adequate volume PATIENT ON FOLLOWING CEFEPIME  2 GRAMS,VANCOMYCIN  1250 MG   Culture   Final    NO GROWTH 5 DAYS Performed at [facility], [address] [place], [place] [address]    Report Status 08/18/2024 FINAL  Final  Resp panel by RT-PCR (RSV, Flu A&B, Covid) Anterior Nasal Swab     Status: None   Collection Time: 08/14/24  3:32 AM   Specimen: Anterior Nasal Swab  Result Value Ref Range Status   SARS Coronavirus 2 by RT PCR NEGATIVE NEGATIVE Final   Influenza A by PCR NEGATIVE NEGATIVE Final   Influenza B by PCR NEGATIVE NEGATIVE Final    Comment: (NOTE) The Xpert Xpress SARS-CoV-2/FLU/RSV plus assay is intended as an aid in the diagnosis of influenza from Nasopharyngeal swab specimens and should not be used as a sole basis for treatment. Nasal washings and aspirates are unacceptable for Xpert Xpress SARS-CoV-2/FLU/RSV testing.  Fact Sheet for Patients: bloggercourse.com  Fact Sheet for Healthcare Providers: seriousbroker.it  This test is not yet approved or cleared by the [place]  FDA and has been authorized for detection and/or diagnosis of SARS-CoV-2 by FDA under an Emergency Use Authorization (EUA). This EUA will remain in effect (meaning this test can be used) for the duration of the COVID-19 declaration under Section 564(b)(1) of the Act, 21 U.S.C. section  360bbb-3(b)(1), unless the authorization is terminated or revoked.     Resp Syncytial Virus by PCR NEGATIVE NEGATIVE Final    Comment: (NOTE) Fact Sheet for Patients: bloggercourse.com  Fact Sheet for Healthcare Providers: seriousbroker.it  This test is not yet approved or cleared by the [place]  FDA and has been authorized for detection and/or diagnosis of SARS-CoV-2 by FDA under an Emergency Use Authorization (EUA). This EUA will remain in effect (meaning this test can be used) for the duration of the COVID-19 declaration under Section 564(b)(1) of the Act, 21 U.S.C. section 360bbb-3(b)(1), unless the authorization is terminated or revoked.  Performed at [facility]  [facility], [address] [place], [place] [address]          Radiology Studies: No results found.      Scheduled Meds:  antiseptic oral rinse  15 mL Mouth Rinse Q4H   budesonide -glycopyrrolate -formoterol   2 puff Inhalation BID   dexamethasone   4 mg Oral Q8H   famotidine   40 mg Oral BID   feeding supplement  237 mL Oral BID BM   saccharomyces boulardii  250 mg Oral BID   senna-docusate  1 tablet Oral QHS   Continuous Infusions:     LOS: 8 days   Time spent:  Elsie JAYSON Montclair, DO [facility]  If 7PM-7AM, please contact night-coverage [email or web address]  08/20/2024, 7:14 AM      "

## 2024-08-21 ENCOUNTER — Other Ambulatory Visit: Payer: Self-pay

## 2024-08-21 ENCOUNTER — Ambulatory Visit

## 2024-08-21 ENCOUNTER — Inpatient Hospital Stay

## 2024-08-21 DIAGNOSIS — C7931 Secondary malignant neoplasm of brain: Secondary | ICD-10-CM | POA: Diagnosis not present

## 2024-08-21 LAB — COMPREHENSIVE METABOLIC PANEL WITH GFR
ALT: 21 U/L (ref 0–44)
AST: 22 U/L (ref 15–41)
Albumin: 2.7 g/dL — ABNORMAL LOW (ref 3.5–5.0)
Alkaline Phosphatase: 115 U/L (ref 38–126)
Anion gap: 9 (ref 5–15)
BUN: 60 mg/dL — ABNORMAL HIGH (ref 8–23)
CO2: 26 mmol/L (ref 22–32)
Calcium: 9.9 mg/dL (ref 8.9–10.3)
Chloride: 102 mmol/L (ref 98–111)
Creatinine, Ser: 0.62 mg/dL (ref 0.61–1.24)
GFR, Estimated: >60 mL/min
Glucose, Bld: 127 mg/dL — ABNORMAL HIGH (ref 70–99)
Potassium: 6.3 mmol/L (ref 3.5–5.1)
Sodium: 136 mmol/L (ref 135–145)
Total Bilirubin: 0.4 mg/dL (ref 0.0–1.2)
Total Protein: 5.9 g/dL — ABNORMAL LOW (ref 6.5–8.1)

## 2024-08-21 LAB — BASIC METABOLIC PANEL WITH GFR
Anion gap: 10 (ref 5–15)
BUN: 67 mg/dL — ABNORMAL HIGH (ref 8–23)
CO2: 25 mmol/L (ref 22–32)
Calcium: 10.2 mg/dL (ref 8.9–10.3)
Chloride: 100 mmol/L (ref 98–111)
Creatinine, Ser: 1.41 mg/dL — ABNORMAL HIGH (ref 0.61–1.24)
GFR, Estimated: 53 mL/min — ABNORMAL LOW
Glucose, Bld: 131 mg/dL — ABNORMAL HIGH (ref 70–99)
Potassium: 6 mmol/L — ABNORMAL HIGH (ref 3.5–5.1)
Sodium: 135 mmol/L (ref 135–145)

## 2024-08-21 LAB — POTASSIUM
Potassium: 5.6 mmol/L — ABNORMAL HIGH (ref 3.5–5.1)
Potassium: 5.4 mmol/L — ABNORMAL HIGH (ref 3.5–5.1)

## 2024-08-21 LAB — GLUCOSE, CAPILLARY: Glucose-Capillary: 98 mg/dL (ref 70–99)

## 2024-08-21 MED ORDER — FUROSEMIDE 10 MG/ML IJ SOLN
40.0000 mg | Freq: Once | INTRAMUSCULAR | Status: AC
  Administered 2024-08-21: 40 mg via INTRAVENOUS
  Filled 2024-08-21: qty 4

## 2024-08-21 MED ORDER — DEXTROSE 50 % IV SOLN
1.0000 | Freq: Once | INTRAVENOUS | Status: AC
  Administered 2024-08-21: 50 mL via INTRAVENOUS
  Filled 2024-08-21: qty 50

## 2024-08-21 MED ORDER — INSULIN ASPART 100 UNIT/ML IV SOLN
10.0000 [IU] | Freq: Once | INTRAVENOUS | Status: AC
  Administered 2024-08-21: 10 [IU] via INTRAVENOUS
  Filled 2024-08-21: qty 10

## 2024-08-21 MED ORDER — SODIUM ZIRCONIUM CYCLOSILICATE 5 G PO PACK
5.0000 g | PACK | Freq: Once | ORAL | Status: AC
  Administered 2024-08-21: 5 g via ORAL
  Filled 2024-08-21: qty 1

## 2024-08-21 MED ORDER — IPRATROPIUM-ALBUTEROL 0.5-2.5 (3) MG/3ML IN SOLN
3.0000 mL | Freq: Once | RESPIRATORY_TRACT | Status: AC
  Administered 2024-08-21: 3 mL via RESPIRATORY_TRACT
  Filled 2024-08-21: qty 3

## 2024-08-21 MED ORDER — SODIUM ZIRCONIUM CYCLOSILICATE 10 G PO PACK
10.0000 g | PACK | Freq: Once | ORAL | Status: AC
  Administered 2024-08-21: 10 g via ORAL
  Filled 2024-08-21: qty 1

## 2024-08-21 NOTE — Progress Notes (Signed)
 " PROGRESS NOTE    Jeffrey Norton  FMW:[id number] DOB: 04-06-1951 DOA: 08/12/2024 PCP: Dayna Motto, DO  Brief Narrative:  Patient is a 74 y.o.  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  Assessment & Plan:   Principal Problem:   Metastasis to brain [facility]) Active Problems:   CHF exacerbation (HCC)   Metastatic primary lung cancer (HCC)   Sepsis (HCC)   Elevated troponin  Goals of care  - Given due to biopsy results of poorly differentiated adenocarcinoma and no option for chemotherapy oncology recommending ongoing radiation treatment as well as discussion with palliative care and hospice. - Discussed with patient's family and patient at bedside today, continue to be hopeful for improvement with radiation.  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 - Lasix  x 1, Lokelma  x 1 - EKG without notable peaked T waves or dysrhythmia - Repeat labs pending  Acute metabolic encephalopathy secondary to brain mets with hemorrhagic change/vasogenic edema, resolving Encephalopathy has improved markedly after initiation of steroids, approaching baseline Neurosurgery recommending radiation treatment, no indication at this time for craniotomy  Transferred to [facility] for radiation therapy to start 1/23   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 Chronically on 2 L nasal cannula at rest and with exertion   COPD Not in exacerbation Continue bronchodilators/supportive care   Chronic HFrEF Euvolemic.  Tolerated IV fluids well, follow clinically -holding any diuretics in the setting of AKI as above   History of CAD s/p multiple PCI-most recently in 2024 Without notable anginal symptoms Hold home aspirin  secondary to anemia, no notable source of bleeding at this time   PAF Previously has refused anticoagulation Remains rate controlled off any rate control medications   PAD Asymptomatic, aspirin  on hold as above   Dysphagia SLP following. Advancing diet as tolerated -currently on regular diet thin liquids   Iron deficiency anemia. Hemoglobin stable No active bleeding reported. Iron level significantly low -IV iron completed  Asterixis. Resolved.  Suspect secondary to uremia - now resolved TSH 1.4 WNL - free T41.1 Ammonia 25.  DVT prophylaxis: SCDs Start: 08/13/24 0021 Code Status:   Code Status: Limited: Do not attempt resuscitation (DNR) -DNR-LIMITED -Do Not Intubate/DNI  Family Communication: At bedside, wife  Status is: Inpatient  Dispo: The patient is from: Home              Anticipated d/c is to: To be determined              Anticipated d/c date is: To be determined              Patient currently not medically stable for discharge  Consultants:  Radiation oncology Medication oncology Neurosurgery Palliative care Interventional radiology  Procedures:  1/20>> CT-guided right adrenal mass biopsy by IR 1/20>> Echo 45 to 50%, grade 1 diastolic dysfunction hypokinesis of left ventricle, basal mid inferior wall, inferolateral and  inferoseptal wall with reduced RV function and possible PHTN moderate mitral regurg unchanged echo from before other than worsening mitral regurg.  Imaging of note 1/17>> MRI brain: 3 contrast-enhancing metastatic lesions in the brain. 1/17>> CT abdomen/pelvis: Enlarging 6.1 x 4.9 cm right adrenal mass 1/17>> CTA head/neck: Moderate stenosis of left vertebral artery-less than 50% stenosis of right carotid, approximately 50% stenosis of left carotid artery. 1/17>> CTA chest: No PE, enlarging 16.4 cm right upper lobe mass.  Antimicrobials:  None  Subjective: No acute issues or events overnight, improving appetite -denies nausea vomiting diarrhea constipation headache fevers chills or chest pain  Objective: Vitals:   08/20/24 1900 08/20/24 2211 08/21/24 0500 08/21/24 0529  BP:  (!) 147/76  133/69  Pulse:  80  63  Resp:  18  17  Temp:  97.8 F (36.6 C)  97.7 F (36.5 C)  TempSrc:  Oral  Oral  SpO2: 98% 100%  100%  Weight:   68.1 kg   Height:        Intake/Output Summary (Last 24 hours) at 08/21/2024 0734 Last data filed at 08/21/2024 0600 Gross per 24 hour  Intake 400 ml  Output 1200 ml  Net -800 ml   Filed Weights   08/18/24 0500 08/20/24 0714 08/21/24 0500  Weight: 64 kg 67.8 kg 68.1 kg    Examination:  General:  Pleasantly resting in bed, No acute distress. HEENT:  Normocephalic atraumatic.  Sclerae nonicteric, noninjected.  Extraocular movements intact bilaterally. Neck:  Without mass or deformity.  Trachea is midline. Lungs:  Clear to auscultate bilaterally without rhonchi, wheeze, or rales. Heart:  Regular rate and rhythm.  Without murmurs, rubs, or gallops. Abdomen:  Soft, nontender, nondistended.  Without guarding or rebound. Extremities: Without cyanosis, clubbing, edema, or obvious deformity. Skin:  Warm and dry, no erythema.   Data Reviewed: I have personally reviewed following labs and imaging studies  CBC: Recent Labs  Lab 08/14/24 0917 08/17/24 0305  08/17/24 1339 08/18/24 0835 08/19/24 0550  WBC 23.9* 22.9* 25.0* 26.4* 26.9*  NEUTROABS  --  21.2*  --   --  25.1*  HGB 10.0* 8.6* 8.9* 9.1* 9.1*  HCT 32.4* 27.9* 28.8* 30.5* 30.7*  MCV 94.7 93.0 94.1 95.9 96.2  PLT 337 279 285 301 281   Basic Metabolic Panel: Recent Labs  Lab 08/15/24 0306 08/16/24 1427 08/17/24 0305 08/18/24 0835 08/19/24 0550  NA 136 138 138 137 135  K 4.9 4.6 4.6 4.9 5.4*  CL 101 102 104 105 103  CO2 24 27 24 25 25   GLUCOSE 121* 148* 171* 120* 119*  BUN 84* 75* 68* 51* 47*  CREATININE 1.84* 1.49* 1.33* 1.04 1.04  CALCIUM  9.6 9.3 8.9 9.6 9.6  MG  --  2.3  --   --   --    GFR: Estimated Creatinine Clearance: 60.9 mL/min (by C-G formula based on SCr of 1.04 mg/dL). Liver Function Tests: Recent Labs  Lab 08/17/24 0305 08/19/24 0550  AST 15 21  ALT 7 9  ALKPHOS 126 124  BILITOT 0.3 0.4  PROT 5.8* 6.0*  ALBUMIN 2.5* 2.6*   No results for input(s): LIPASE, AMYLASE in the last 168 hours. Recent Labs  Lab 08/16/24 1427  AMMONIA 25   Coagulation Profile: Recent Labs  Lab 08/15/24 0306  INR 1.2   Cardiac Enzymes: No  results for input(s): CKTOTAL, CKMB, CKMBINDEX, TROPONINI in the last 168 hours. BNP (last 3 results) Recent Labs    08/12/24 1906  PROBNP 5,355.0*   HbA1C: No results for input(s): HGBA1C in the last 72 hours. CBG: No results for input(s): GLUCAP in the last 168 hours. Lipid Profile: No results for input(s): CHOL, HDL, LDLCALC, TRIG, CHOLHDL, LDLDIRECT in the last 72 hours. Thyroid  Function Tests: No results for input(s): TSH, T4TOTAL, FREET4, T3FREE, THYROIDAB in the last 72 hours.  Anemia Panel: No results for input(s): VITAMINB12, FOLATE, FERRITIN, TIBC, IRON, RETICCTPCT in the last 72 hours. Sepsis Labs: No results for input(s): PROCALCITON, LATICACIDVEN in the last 168 hours.   Recent Results (from the past 240 hours)  Culture, blood (Routine X 2) w Reflex  to ID Panel     Status: None   Collection Time: 08/13/24  1:30 AM   Specimen: BLOOD LEFT ARM  Result Value Ref Range Status   Specimen Description BLOOD LEFT ARM  Final   Special Requests   Final    BOTTLES DRAWN AEROBIC AND ANAEROBIC Blood Culture adequate volume PATIENT ON FOLLOWING CEFEPIME  2 GRAMS,VANCOMYCIN  1250 MG   Culture   Final    NO GROWTH 5 DAYS Performed at [facility], [address], [place], [place] [address]    Report Status 08/18/2024 FINAL  Final  Culture, blood (Routine X 2) w Reflex to ID Panel     Status: None   Collection Time: 08/13/24  1:49 AM   Specimen: BLOOD RIGHT HAND  Result Value Ref Range Status   Specimen Description BLOOD RIGHT HAND  Final   Special Requests   Final    BOTTLES DRAWN AEROBIC ONLY Blood Culture adequate volume PATIENT ON FOLLOWING CEFEPIME  2 GRAMS,VANCOMYCIN  1250 MG   Culture   Final    NO GROWTH 5 DAYS Performed at [facility], [address], [place], [place] [address]    Report Status 08/18/2024 FINAL  Final  Resp panel by RT-PCR (RSV, Flu A&B, Covid) Anterior Nasal Swab     Status: None   Collection Time: 08/14/24  3:32 AM   Specimen: Anterior Nasal Swab  Result Value Ref Range Status   SARS Coronavirus 2 by RT PCR NEGATIVE NEGATIVE Final   Influenza A by PCR NEGATIVE NEGATIVE Final   Influenza B by PCR NEGATIVE NEGATIVE Final    Comment: (NOTE) The Xpert Xpress SARS-CoV-2/FLU/RSV plus assay is intended as an aid in the diagnosis of influenza from Nasopharyngeal swab specimens and should not be used as a sole basis for treatment. Nasal washings and aspirates are unacceptable for Xpert Xpress SARS-CoV-2/FLU/RSV testing.  Fact Sheet for Patients: bloggercourse.com  Fact Sheet for Healthcare Providers: seriousbroker.it  This test is not yet approved or cleared by the [place]  FDA and has been authorized for detection and/or diagnosis of SARS-CoV-2 by FDA  under an Emergency Use Authorization (EUA). This EUA will remain in effect (meaning this test can be used) for the duration of the COVID-19 declaration under Section 564(b)(1) of the Act, 21 U.S.C. section 360bbb-3(b)(1), unless the authorization is terminated or revoked.     Resp Syncytial Virus by PCR NEGATIVE NEGATIVE Final    Comment: (NOTE) Fact Sheet for Patients: bloggercourse.com  Fact Sheet for Healthcare Providers: seriousbroker.it  This test is not yet approved or cleared by the [place]  FDA and has been authorized for detection and/or diagnosis of SARS-CoV-2 by FDA under an Emergency Use Authorization (EUA). This EUA will  remain in effect (meaning this test can be used) for the duration of the COVID-19 declaration under Section 564(b)(1) of the Act, 21 U.S.C. section 360bbb-3(b)(1), unless the authorization is terminated or revoked.  Performed at [facility], [address], [place], [place] [address]          Radiology Studies: No results found.      Scheduled Meds:  antiseptic oral rinse  15 mL Mouth Rinse Q4H   budesonide -glycopyrrolate -formoterol   2 puff Inhalation BID   dexamethasone   4 mg Oral Q8H   famotidine   40 mg Oral BID   feeding supplement  237 mL Oral BID BM   saccharomyces boulardii  250 mg Oral BID   senna-docusate  1 tablet Oral QHS   Continuous Infusions:     LOS: 9 days   Time spent:  Jeffrey Norton [place], DO [facility]  If 7PM-7AM, please contact night-coverage [email or web address]  08/21/2024, 7:34 AM      "

## 2024-08-21 NOTE — Progress Notes (Signed)
 "                                                                                                                                                                                                          Daily Progress Note   Patient Name: Jeffrey Norton       Date: 08/21/2024 DOB: [birth year]  Age: 74 y.o. MRN#: [id number] Attending Physician: Lue Elsie BROCKS, MD Primary Care Physician: Dayna Motto, DO Admit Date: 08/12/2024  Reason for Consultation/Follow-up: Establishing goals of care, Non pain symptom management, and Pain control  Subjective:  Awake alert resting in bed, appears with generalized weakness   Wife at bedside      Length of Stay: 9  Current Medications: Scheduled Meds:   antiseptic oral rinse  15 mL Mouth Rinse Q4H   budesonide -glycopyrrolate -formoterol   2 puff Inhalation BID   dexamethasone   4 mg Oral Q8H   famotidine   40 mg Oral BID   feeding supplement  237 mL Oral BID BM   saccharomyces boulardii  250 mg Oral BID   senna-docusate  1 tablet Oral QHS    Continuous Infusions:    PRN Meds: acetaminophen  **OR** acetaminophen , ondansetron  (ZOFRAN ) IV, mouth rinse, mouth rinse  Physical Exam         Awake alert Appears with generalized weakness Regular work of breathing    Vital Signs: BP 133/69 (BP Location: Right Arm)   Pulse 63   Temp 97.7 F (36.5 C) (Oral)   Resp 17   Ht 5' 9 (1.753 m)   Wt 68.1 kg   SpO2 100%   BMI 22.17 kg/m  SpO2: SpO2: 100 % O2 Device: O2 Device: Room Air O2 Flow Rate: O2 Flow Rate (L/min): 2 L/min  Intake/output summary:  Intake/Output Summary (Last 24 hours) at 08/21/2024 1131 Last data filed at 08/21/2024 0600 Gross per 24 hour  Intake 400 ml  Output 1200 ml  Net -800 ml   LBM: Last BM Date : 08/18/24 Baseline Weight: Weight: 66.3 kg Most recent weight: Weight: 68.1 kg       Palliative Assessment/Data:      Patient Active Problem List   Diagnosis Date Noted   Metastatic primary lung cancer  (HCC) 08/13/2024   Sepsis (HCC) 08/13/2024   Elevated troponin 08/13/2024   Metastasis to brain (HCC) 08/12/2024   AKI (acute kidney injury) 03/05/2023   CHF exacerbation (HCC) 03/04/2023   Acute on chronic systolic heart failure (HCC) 03/04/2023   CKD (chronic kidney disease) stage 3, GFR 30-59 ml/min (HCC) 03/04/2023   Unstable  angina (HCC) 02/22/2023   ACS (acute coronary syndrome) (HCC) 02/22/2023   Atrial flutter (HCC) 07/08/2022   HFrEF (heart failure with reduced ejection fraction) (HCC) 07/08/2022   Atrial fibrillation (HCC) 07/07/2022   Non-ST elevation (NSTEMI) myocardial infarction [facility]) 07/07/2022   NSTEMI (non-ST elevated myocardial infarction) (HCC) 07/07/2022   Hyperlipidemia 04/25/2013   Claudication of lower extremity 08/15/2012   HTN (hypertension) 08/15/2012   CAD Inferior wall MI 08/2001, RCA PTA/DES with ISR - DES 12/05 at [facility] 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 CTA chest (1/17): Enlarging 16.4 cm right upper lobe mass; no PE. CT A/P (1/17): Enlarging right adrenal mass measuring 6.1  4.9 cm. S/p CT-guided biopsy of right adrenal mass; pathology pending. Medical oncology consulted; significant concern that patient would not tolerate or meaningfully respond to chemotherapy given frailty and low pre-albumin level. Radiation oncology has started XRT on 1/23. Separate simulation for brain stereotactic radiosurgery [facility]), with treatment to begin afterwards.     PMT to continue multidisciplinary discussions as treatment tolerance is reassessed. To continue with radiation  for now. Oncology also following closely.    Acute Metabolic Encephalopathy Secondary to Brain Metastases with Vasogenic Edema Improved following initiation of steroids for CNS edema. Neurosurgery has recommended stereotactic radiation rather than craniotomy. Continue steroids as directed; monitor mental status.  Acute Kidney Injury on CKD Stage IIIa Likely hemodynamically mediated. Baseline creatinine ~1.6; peaked at 2.1 on admission, now improved to 0.62 with hydration. Continue to monitor renal function; avoid nephrotoxins.   Disposition / Ongoing Care Continue inpatient management during radiation planning and initiation. Biopsy results did confirm adenocarcinoma consistent with lung cancer Radiation is being done and a Palliative care to continue to follow hospital course closely.  Code Status:    Code Status Orders  (From admission, onward)           Start     Ordered   08/13/24 0022  Do not attempt resuscitation (DNR)- Limited -Do Not Intubate (DNI)  Continuous       Question Answer Comment  If pulseless and not breathing No CPR or chest compressions.   In Pre-Arrest Conditions (Patient Is Breathing and Has A Pulse) Do not intubate. Provide all appropriate non-invasive medical interventions. Avoid ICU transfer unless indicated or required.   Consent: Discussion documented in EHR or advanced directives reviewed      08/13/24 0025           Code Status History     Date Active Date Inactive Code Status Order ID Comments User Context   08/13/2024 0014 08/13/2024 0025 Limited: Do not attempt resuscitation (DNR) -DNR-LIMITED -Do Not Intubate/DNI  [id number]  Alfornia Madison, MD ED   03/04/2023 0530 03/06/2023 1836 Full Code [id number]  Laveda Roosevelt, MD ED   02/22/2023 1110 02/24/2023 0257 Full Code [id number]  Barrett, Shona MATSU, PA-C ED   07/07/2022 1907 07/08/2022 2127 Full Code [id number]  Jadine Aline BRAVO, PA-C Inpatient   07/07/2022 1619 07/07/2022 1907 Full Code  [id number]  Jordan, Peter M, MD Inpatient       Prognosis:  Unable to determine  Discharge Planning: To Be Determined  Care plan was  discussed with  patient and wife.   Thank you for allowing the Palliative Medicine Team to assist in the care of this patient. I personally spent a total of 35 minutes in the care of the patient today including preparing to see the patient, getting/reviewing separately obtained history, performing a medically appropriate exam/evaluation, counseling and educating, referring and communicating with other health care professionals, and coordinating care.      Greater than 50%  of this time was spent counseling and coordinating care related to the above assessment and plan.  Lonia Serve, MD  Please contact Palliative Medicine Team phone at [phone] for questions and concerns.       "

## 2024-08-21 NOTE — Plan of Care (Signed)

## 2024-08-21 NOTE — Progress Notes (Signed)
 Physical Therapy Treatment Patient Details Name: Jeffrey Norton [name] DOB: Nov 09, 1950 Today's Date: 08/21/2024   History of Present Illness 74 y.o. male presents 08/12/24 for acute onset AMS, aphasia, and generalized weakness. CT of the head showed multiple brain mass in the left frontal right occipital and right frontal lobe with vasogenic edema. Underwent IR biopsy of adrenal gland 1/20. PMH: CAD status post multiple PCIs, paroxysmal A-fib/flutter for which patient had previously declined anticoagulation, HFmrEF, pericarditis/pericardial effusion, CKD stage IIIa, HTN, HLD, PVD, GERD, GI bleed, chronic hypoxemic respiratory failure on 2 L home O2. History of right upper lung mass suspicious for malignancy, on recent PET scan done about a month ago showed stage IV primary bronchogenic carcinoma with mediastinal adenopathy and multiple bone and adrenal gland mets    PT Comments  Pt agreeable to working with therapy. Wife present during session and very encouraging and helpful. Mod A for bed mobility and Min A for transfers on today. Pt appears weak and fatigued. He participated fairly well with increased time needed for processing. Total A for hygiene due to BM incontinence. Assisted pt into recliner and encouraged him to sit up as tolerated. Instructed pt and wife to call for assistance when ready to get back to bed.     If plan is discharge home, recommend the following: A little help with walking and/or transfers;A little help with bathing/dressing/bathroom;Assistance with cooking/housework;Assist for transportation;Help with stairs or ramp for entrance   Can travel by private vehicle        Equipment Recommendations  Rolling walker (2 wheels);BSC/3in1;Other (comment) (shower chair)    Recommendations for Other Services       Precautions / Restrictions Precautions Precautions: Fall Restrictions Weight Bearing Restrictions Per Provider Order: No     Mobility  Bed  Mobility Overal bed mobility: Needs Assistance Bed Mobility: Sidelying to Sit   Sidelying to sit: Mod assist, HOB elevated, Used rails       General bed mobility comments: Increased time to complete transfer, initiated movement but then requested assist for trunk and LEs.    Transfers Overall transfer level: Needs assistance Equipment used: Rolling walker (2 wheels) Transfers: Sit to/from Stand, Bed to chair/wheelchair/BSC Sit to Stand: Min assist, From elevated surface   Step pivot transfers: Min assist       General transfer comment: multimodal cues for positioning and technique. Increased time. Assist to power up, stabilize, control descent.    Ambulation/Gait             Pre-gait activities: forward and backwards stepping during mobility from bed to recliner     Stairs             Wheelchair Mobility     Tilt Bed    Modified Rankin (Stroke Patients Only)       Balance Overall balance assessment: Needs assistance           Standing balance-Leahy Scale: Poor Standing balance comment: Dependent on RW and external support, difficulty weight shifting to take step and requried additonal support. Unsteady due to fatigue and generalized weakness.                            Communication Communication Communication: No apparent difficulties  Cognition Arousal: Alert Behavior During Therapy: Flat affect   PT - Cognitive impairments: Attention, Awareness, Safety/Judgement, Problem solving  Following commands: Impaired Following commands impaired: Follows one step commands with increased time    Cueing Cueing Techniques: Verbal cues, Visual cues, Tactile cues  Exercises General Exercises - Lower Extremity Long Arc Quad: AROM, Both, Seated    General Comments        Pertinent Vitals/Pain Pain Assessment Pain Assessment: Faces Faces Pain Scale: Hurts little more Pain Descriptors / Indicators:  Grimacing (intermittently) Pain Intervention(s): Monitored during session, Repositioned    Home Living                          Prior Function            PT Goals (current goals can now be found in the care plan section) Progress towards PT goals: Progressing toward goals    Frequency    Min 2X/week      PT Plan      Co-evaluation              AM-PAC PT 6 Clicks Mobility   Outcome Measure  Help needed turning from your back to your side while in a flat bed without using bedrails?: A Little Help needed moving from lying on your back to sitting on the side of a flat bed without using bedrails?: A Lot Help needed moving to and from a bed to a chair (including a wheelchair)?: A Lot Help needed standing up from a chair using your arms (e.g., wheelchair or bedside chair)?: A Lot Help needed to walk in hospital room?: A Lot Help needed climbing 3-5 steps with a railing? : A Lot 6 Click Score: 13    End of Session   Activity Tolerance: Patient limited by fatigue Patient left: in chair;with call bell/phone within reach;with family/visitor present   PT Visit Diagnosis: Unsteadiness on feet (R26.81);Muscle weakness (generalized) (M62.81);Difficulty in walking, not elsewhere classified (R26.2)     Time: 8386-8366 PT Time Calculation (min) (ACUTE ONLY): 20 min  Charges:    $Therapeutic Activity: 8-22 mins PT General Charges $$ ACUTE PT VISIT: 1 Visit                         Dannial SQUIBB, PT Acute Rehabilitation  Office: [phone]

## 2024-08-21 NOTE — Progress Notes (Signed)
 [name] had no problems over the weekend.  He is still quite weak.  It is hard to say how much he is really eating.  There is no problems with pain.  He says he is out of bed.  I think that possibly physical therapy might be working with him.  I do not think that he is going to have any radiation today because of the weather.  There is no cough.  There is no shortness of breath.  He has no bleeding.  His wife has stated over the weekend.  She is doing okay.  Again, I had a long talk with him on Saturday.  I told him what was going on and because of the cancer that he has.  Again, this is all about quality of life.  This is about comfort, respect and dignity.  I do think that radiation will help with this with respect to his CNS disease and possibly trying to help with the mass in the right lung to try to help prevent postobstructive pneumonia from occurring.  Nutrition is critical.  Hopefully he will be able to eat a little bit.  I know the staff is working hard with him.  I do appreciate all of their efforts.    His vital signs show temperature 97.7.  Pulse 63.  Blood pressure 133/69.  His head neck exam shows no ocular or oral lesions.  Oral mucosa still a little dry.  He has no adenopathy in the neck.  Lungs are relatively clear on the left side.  Right side is decreased.  Cardiac exam is regular rate and rhythm he has an occasional extra beat.  Abdomen is soft.  Bowel sounds are present.  Strenuous show some symmetric muscle atrophy in upper and lower extremities.  Neurological exam shows no focal neurological deficits.  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  This is certainly quite aggressive.  Again, I just do not see that he is a candidate for systemic therapy given his poor performance status.  I think that radiation would certainly be reasonable for palliation.  I suspect he will need to be in the hospital for his radiation.  I just do not see  that he can make radiation as an outpatient.  Again, the staff on [address] are doing a great job with him.   Jeralyn Crease, MD  [place] 1:16

## 2024-08-21 NOTE — Progress Notes (Signed)
 Speech Language Pathology Treatment: Dysphagia  Patient Details Name: Jeffrey Norton [name] DOB: June 22, 1951 Today's Date: 08/21/2024 Time: 8893-8882 SLP Time Calculation (min) (ACUTE ONLY): 11 min  Assessment / Plan / Recommendation Clinical Impression  Pt and wife report he is fine with oral intake, he has been upgraded to regular/thin and is doing well. SLP observed to further coughing with thins and educated family about aspiration precautions, emphasized benefit of oral care. No SLP f/u needed.   HPI HPI: Jeffrey Norton is a 74 yo M who presented to the ED with confusion-found to have multiple brain masses 2/2 metastatic lung Ca. Adrenal biopsy 1/20, results pending. MRI 1/21: Four intracranial metastases, ranging from punctate to 37 mm, annotated on series 1100. Stable vasogenic edema and mild mass effect, no midline shift. CT chest 1/17: 16.4 cm right upper lobe mass, corresponding to the patient's known primary bronchogenic carcinoma, previously 13.8 cm. No focal consolidation or pulmonary edema. No evidence of pleural effusion or pneumothorax.  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      SLP Plan  All goals met        Swallow Evaluation Recommendations   Recommendations: PO diet PO Diet Recommendation: Regular;Thin liquids (Level 0) Liquid Administration via: Cup;Straw Medication Administration: Whole meds with liquid Supervision: Other (comment) (family to assist as needed) Postural changes: Position pt fully upright for meals Oral care recommendations: Oral care BID (2x/day)     Recommendations                     Oral care BID           All goals met     Jeffrey Norton, Jeffrey Norton  08/21/2024, 11:19 AM

## 2024-08-22 ENCOUNTER — Other Ambulatory Visit: Payer: Self-pay

## 2024-08-22 ENCOUNTER — Ambulatory Visit

## 2024-08-22 DIAGNOSIS — C7931 Secondary malignant neoplasm of brain: Secondary | ICD-10-CM | POA: Diagnosis not present

## 2024-08-22 LAB — RAD ONC ARIA SESSION SUMMARY
Course Elapsed Days: 4
Plan Fractions Treated to Date: 1
Plan Prescribed Dose Per Fraction: 3 Gy
Plan Total Fractions Prescribed: 9
Plan Total Prescribed Dose: 27 Gy
Reference Point Dosage Given to Date: 3 Gy
Reference Point Session Dosage Given: 3 Gy
Session Number: 2

## 2024-08-22 LAB — BASIC METABOLIC PANEL WITH GFR
Anion gap: 11 (ref 5–15)
BUN: 78 mg/dL — ABNORMAL HIGH (ref 8–23)
CO2: 28 mmol/L (ref 22–32)
Calcium: 10.1 mg/dL (ref 8.9–10.3)
Chloride: 98 mmol/L (ref 98–111)
Creatinine, Ser: 1.59 mg/dL — ABNORMAL HIGH (ref 0.61–1.24)
GFR, Estimated: 46 mL/min — ABNORMAL LOW
Glucose, Bld: 133 mg/dL — ABNORMAL HIGH (ref 70–99)
Potassium: 5.7 mmol/L — ABNORMAL HIGH (ref 3.5–5.1)
Sodium: 136 mmol/L (ref 135–145)

## 2024-08-22 LAB — CBC WITH DIFFERENTIAL/PLATELET
Abs Immature Granulocytes: 0.39 10*3/uL — ABNORMAL HIGH (ref 0.00–0.07)
Basophils Absolute: 0 10*3/uL (ref 0.0–0.1)
Basophils Relative: 0 %
Eosinophils Absolute: 0 10*3/uL (ref 0.0–0.5)
Eosinophils Relative: 0 %
HCT: 31.1 % — ABNORMAL LOW (ref 39.0–52.0)
Hemoglobin: 10 g/dL — ABNORMAL LOW (ref 13.0–17.0)
Immature Granulocytes: 1 %
Lymphocytes Relative: 1 %
Lymphs Abs: 0.4 10*3/uL — ABNORMAL LOW (ref 0.7–4.0)
MCH: 30 pg (ref 26.0–34.0)
MCHC: 32.2 g/dL (ref 30.0–36.0)
MCV: 93.4 fL (ref 80.0–100.0)
Monocytes Absolute: 1.8 10*3/uL — ABNORMAL HIGH (ref 0.1–1.0)
Monocytes Relative: 5 %
Neutro Abs: 32 10*3/uL — ABNORMAL HIGH (ref 1.7–7.7)
Neutrophils Relative %: 93 %
Platelets: 267 10*3/uL (ref 150–400)
RBC: 3.33 MIL/uL — ABNORMAL LOW (ref 4.22–5.81)
RDW: 16.4 % — ABNORMAL HIGH (ref 11.5–15.5)
Smear Review: NORMAL
WBC: 34.7 10*3/uL — ABNORMAL HIGH (ref 4.0–10.5)
nRBC: 0 % (ref 0.0–0.2)

## 2024-08-22 LAB — POTASSIUM
Potassium: 5.6 mmol/L — ABNORMAL HIGH (ref 3.5–5.1)
Potassium: 5 mmol/L (ref 3.5–5.1)

## 2024-08-22 LAB — PREALBUMIN: Prealbumin: 12 mg/dL — ABNORMAL LOW (ref 18–38)

## 2024-08-22 MED ORDER — SODIUM ZIRCONIUM CYCLOSILICATE 5 G PO PACK
5.0000 g | PACK | ORAL | Status: AC
  Administered 2024-08-22: 5 g via ORAL
  Filled 2024-08-22: qty 1

## 2024-08-22 MED ORDER — LACTULOSE 10 GM/15ML PO SOLN
10.0000 g | Freq: Two times a day (BID) | ORAL | Status: DC
  Administered 2024-08-22 – 2024-08-24 (×6): 10 g via ORAL
  Filled 2024-08-22 (×7): qty 15

## 2024-08-22 MED ORDER — DEXAMETHASONE 4 MG PO TABS
4.0000 mg | ORAL_TABLET | Freq: Two times a day (BID) | ORAL | Status: DC
  Administered 2024-08-22 – 2024-08-24 (×5): 4 mg via ORAL
  Filled 2024-08-22 (×6): qty 1

## 2024-08-22 MED ORDER — SODIUM ZIRCONIUM CYCLOSILICATE 5 G PO PACK
5.0000 g | PACK | Freq: Three times a day (TID) | ORAL | Status: AC
  Administered 2024-08-22 – 2024-08-24 (×6): 5 g via ORAL
  Filled 2024-08-22 (×6): qty 1

## 2024-08-22 MED ORDER — SODIUM CHLORIDE 0.9 % IV SOLN: INTRAVENOUS | Status: AC

## 2024-08-22 MED ORDER — ASCORBIC ACID 500 MG/5ML PO LIQD
1000.0000 mg | Freq: Every day | ORAL | Status: DC
  Filled 2024-08-22: qty 10

## 2024-08-22 NOTE — Progress Notes (Addendum)
 Overall, I really do not think there has been any change in his status.  I think he will have radiation therapy today.  He was in a chair for about 45 minutes yesterday.  I am not sure how much he really is able to eat.  I do not think there is any problems with pain.  Noted that his renal function was going down a little bit.  His potassium was going up a little bit.  He may need some Lokelma  to get his potassium down.  His wife was wondering about high dose Vitamin C.  I told her that I will we just do not do that here.  This is typically something that complementary/integrative specialist can do.  He has had no bleeding.  He has had no headache.  There has been no cough.  I told his wife that he has to use the incentive spirometer at least 4 or 5 times an hour.  He has had no fever.  There has been no nausea or vomiting.  Overall, I would say that his performance status is still ECOG 3.  Vital signs are all stable.  Blood pressure 128/82.  Pulse is 84.  Temperature 97.5.  Overall, there is no change in his physical exam.  Again, I do worry that the renal function is going down.  We will have to watch this closely.  Hopefully he will have his radiotherapy today.  I do appreciate the incredible care that he is getting from all the staff up on 6 [address].   Jeralyn Crease, MD  [place] 1:5

## 2024-08-22 NOTE — Progress Notes (Signed)
 Not much to heart from a pulmonary perspective  Pulmonary will sign off

## 2024-08-22 NOTE — Progress Notes (Signed)
 " PROGRESS NOTE    Jeffrey Norton  FMW:[id number] DOB: 1950/09/04 DOA: 08/12/2024 PCP: Dayna Motto, DO  Brief Narrative:  Patient is a 74 y.o.  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  Assessment & Plan:   Principal Problem:   Metastasis to brain [facility]) Active Problems:   CHF exacerbation (HCC)   Metastatic primary lung cancer (HCC)   Sepsis (HCC)   Elevated troponin  Goals of care  - Given due to biopsy results of poorly differentiated adenocarcinoma and no option for chemotherapy oncology recommending ongoing radiation treatment as well as discussion with palliative care and hospice. - Discussed with patient's family and patient at bedside today, continue to be hopeful for improvement with radiation.  We discussed possible discharge home once his symptoms are more well-controlled and he is able to transport to radiation from home which may be difficult in the setting of transport  Hyperkalemia, rule out tumor lysis syndrome AKI on CKD 3a, resolving - Unclear etiology although given proximity to radiation likely tumor lysis syndrome - Continue normal saline @50cc /h given poor p.o. intake and ongoing hyperkalemia - Creatinine baseline around 1.6(currently at baseline) - Repeat Lokelma , Kayexalate today - follow repeat labs, potassium slowly downtrending appropriately - EKG 1/26 without notable peaked T waves or dysrhythmia despite potassium of 6.3  Acute metabolic encephalopathy secondary to brain mets with hemorrhagic change/vasogenic edema, resolving Encephalopathy has improved markedly after initiation of steroids, approaching baseline per wife Neurosurgery recommending radiation treatment, no indication at this time for craniotomy Transferred to 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 Chronically on 2 L nasal cannula at rest and with exertion   COPD Not in exacerbation Continue bronchodilators/supportive care   Chronic HFrEF Euvolemic.  Tolerated IV fluids well, follow clinically -holding any diuretics in the setting of AKI as above   History of CAD s/p multiple PCI-most recently in 2024 Without notable anginal symptoms Hold home aspirin  secondary to anemia, no notable source of bleeding at this time   PAF Previously has refused anticoagulation Remains rate controlled off any rate control medications   PAD Asymptomatic, aspirin  on hold as above   Dysphagia SLP following. Advancing diet as tolerated -currently on regular diet thin liquids   Iron deficiency anemia. Hemoglobin stable No active bleeding reported. Iron level significantly low -IV iron completed  Asterixis. Resolved.  Suspect secondary to uremia - now resolved TSH 1.4 WNL - free T41.1 Ammonia 25.  DVT prophylaxis: SCDs Start: 08/13/24 0021 Code Status:   Code Status: Limited: Do not attempt resuscitation (DNR) -DNR-LIMITED -Do Not Intubate/DNI  Family Communication: At bedside, wife  Status is: Inpatient  Dispo: The patient is from: Home              Anticipated d/c is to: To be determined              Anticipated d/c date is: To be determined  Patient currently not medically stable for discharge  Consultants:  Radiation oncology Medication oncology Neurosurgery Palliative care Interventional  radiology  Procedures:  1/20>> CT-guided right adrenal mass biopsy by IR 1/20>> Echo 45 to 50%, grade 1 diastolic dysfunction hypokinesis of left ventricle, basal mid inferior wall, inferolateral and inferoseptal wall with reduced RV function and possible PHTN moderate mitral regurg unchanged echo from before other than worsening mitral regurg.  Imaging of note 1/17>> MRI brain: 3 contrast-enhancing metastatic lesions in the brain. 1/17>> CT abdomen/pelvis: Enlarging 6.1 x 4.9 cm right adrenal mass 1/17>> CTA head/neck: Moderate stenosis of left vertebral artery-less than 50% stenosis of right carotid, approximately 50% stenosis of left carotid artery. 1/17>> CTA chest: No PE, enlarging 16.4 cm right upper lobe mass.  Antimicrobials:  None  Subjective: No acute issues or events overnight, improving appetite -denies nausea vomiting diarrhea constipation headache fevers chills or chest pain  Objective: Vitals:   08/21/24 0529 08/21/24 1357 08/21/24 2131 08/22/24 0536  BP: 133/69 134/73 (!) 141/80 128/82  Pulse: 63 98 85 84  Resp: 17 16 18 18   Temp: 97.7 F (36.5 C) 97.6 F (36.4 C) 98 F (36.7 C) (!) 97.5 F (36.4 C)  TempSrc: Oral Oral    SpO2: 100% 90% 99% 99%  Weight:      Height:        Intake/Output Summary (Last 24 hours) at 08/22/2024 0724 Last data filed at 08/22/2024 0700 Gross per 24 hour  Intake --  Output 2700 ml  Net -2700 ml   Filed Weights   08/18/24 0500 08/20/24 0714 08/21/24 0500  Weight: 64 kg 67.8 kg 68.1 kg    Examination:  General:  Pleasantly resting in bed, No acute distress.  Cachectic appearing HEENT:  Normocephalic atraumatic.  Sclerae nonicteric, noninjected.  Extraocular movements intact bilaterally. Neck:  Without mass or deformity.  Trachea is midline. Lungs:  Clear to auscultate bilaterally without rhonchi, wheeze, or rales. Heart:  Regular rate and rhythm.  Without murmurs, rubs, or gallops. Abdomen:  Soft, nontender, nondistended.   Without guarding or rebound. Extremities: Without cyanosis, clubbing, edema, or obvious deformity. Skin:  Warm and dry, no erythema.  Data Reviewed: I have personally reviewed following labs and imaging studies  CBC: Recent Labs  Lab 08/17/24 0305 08/17/24 1339 08/18/24 0835 08/19/24 0550 08/22/24 0110  WBC 22.9* 25.0* 26.4* 26.9* 34.7*  NEUTROABS 21.2*  --   --  25.1* 32.0*  HGB 8.6* 8.9* 9.1* 9.1* 10.0*  HCT 27.9* 28.8* 30.5* 30.7* 31.1*  MCV 93.0 94.1 95.9 96.2 93.4  PLT 279 285 301 281 267   Basic Metabolic Panel: Recent Labs  Lab 08/16/24 1427 08/17/24 0305 08/18/24 0835 08/19/24 0550 08/21/24 0852 08/21/24 1430 08/21/24 1636 08/21/24 2056 08/22/24 0110  NA 138   < > 137 135 136 135  --   --  136  K 4.6   < > 4.9 5.4* 6.3* 6.0* 5.6* 5.4* 5.7*  CL 102   < > 105 103 102 100  --   --  98  CO2 27   < > 25 25 26 25   --   --  28  GLUCOSE 148*   < > 120* 119* 127* 131*  --   --  133*  BUN 75*   < > 51* 47* 60* 67*  --   --  78*  CREATININE 1.49*   < > 1.04 1.04 0.62 1.41*  --   --  1.59*  CALCIUM  9.3   < >  9.6 9.6 9.9 10.2  --   --  10.1  MG 2.3  --   --   --   --   --   --   --   --    < > = values in this interval not displayed.   GFR: Estimated Creatinine Clearance: 39.9 mL/min (A) (by C-G formula based on SCr of 1.59 mg/dL (H)). Liver Function Tests: Recent Labs  Lab 08/17/24 0305 08/19/24 0550 08/21/24 0852  AST 15 21 22   ALT 7 9 21   ALKPHOS 126 124 115  BILITOT 0.3 0.4 0.4  PROT 5.8* 6.0* 5.9*  ALBUMIN 2.5* 2.6* 2.7*   No results for input(s): LIPASE, AMYLASE in the last 168 hours. Recent Labs  Lab 08/16/24 1427  AMMONIA 25   Coagulation Profile: No results for input(s): INR, PROTIME in the last 168 hours.  Cardiac Enzymes: No results for input(s): CKTOTAL, CKMB, CKMBINDEX, TROPONINI in the last 168 hours. BNP (last 3 results) Recent Labs    08/12/24 1906  PROBNP 5,355.0*   HbA1C: No results for input(s): HGBA1C in the  last 72 hours. CBG: Recent Labs  Lab 08/21/24 1519  GLUCAP 98   Lipid Profile: No results for input(s): CHOL, HDL, LDLCALC, TRIG, CHOLHDL, LDLDIRECT in the last 72 hours. Thyroid  Function Tests: No results for input(s): TSH, T4TOTAL, FREET4, T3FREE, THYROIDAB in the last 72 hours.  Anemia Panel: No results for input(s): VITAMINB12, FOLATE, FERRITIN, TIBC, IRON, RETICCTPCT in the last 72 hours. Sepsis Labs: No results for input(s): PROCALCITON, LATICACIDVEN in the last 168 hours.   Recent Results (from the past 240 hours)  Culture, blood (Routine X 2) w Reflex to ID Panel     Status: None   Collection Time: 08/13/24  1:30 AM   Specimen: BLOOD LEFT ARM  Result Value Ref Range Status   Specimen Description BLOOD LEFT ARM  Final   Special Requests   Final    BOTTLES DRAWN AEROBIC AND ANAEROBIC Blood Culture adequate volume PATIENT ON FOLLOWING CEFEPIME  2 GRAMS,VANCOMYCIN  1250 MG   Culture   Final    NO GROWTH 5 DAYS Performed at [facility], [address], [place], [place] [address]    Report Status 08/18/2024 FINAL  Final  Culture, blood (Routine X 2) w Reflex to ID Panel     Status: None   Collection Time: 08/13/24  1:49 AM   Specimen: BLOOD RIGHT HAND  Result Value Ref Range Status   Specimen Description BLOOD RIGHT HAND  Final   Special Requests   Final    BOTTLES DRAWN AEROBIC ONLY Blood Culture adequate volume PATIENT ON FOLLOWING CEFEPIME  2 GRAMS,VANCOMYCIN  1250 MG   Culture   Final    NO GROWTH 5 DAYS Performed at [facility], [address] [place], [place] [address]    Report Status 08/18/2024 FINAL  Final  Resp panel by RT-PCR (RSV, Flu A&B, Covid) Anterior Nasal Swab     Status: None   Collection Time: 08/14/24  3:32 AM   Specimen: Anterior Nasal Swab  Result Value Ref Range Status   SARS Coronavirus 2 by RT PCR NEGATIVE NEGATIVE Final   Influenza A by PCR NEGATIVE NEGATIVE Final   Influenza B by PCR  NEGATIVE NEGATIVE Final    Comment: (NOTE) The Xpert Xpress SARS-CoV-2/FLU/RSV plus assay is intended as an aid in the diagnosis of influenza from Nasopharyngeal swab specimens and should not be used as a sole basis for treatment. Nasal washings and  aspirates are unacceptable for Xpert Xpress SARS-CoV-2/FLU/RSV testing.  Fact Sheet for Patients: bloggercourse.com  Fact Sheet for Healthcare Providers: seriousbroker.it  This test is not yet approved or cleared by the [place]  FDA and has been authorized for detection and/or diagnosis of SARS-CoV-2 by FDA under an Emergency Use Authorization (EUA). This EUA will remain in effect (meaning this test can be used) for the duration of the COVID-19 declaration under Section 564(b)(1) of the Act, 21 U.S.C. section 360bbb-3(b)(1), unless the authorization is terminated or revoked.     Resp Syncytial Virus by PCR NEGATIVE NEGATIVE Final    Comment: (NOTE) Fact Sheet for Patients: bloggercourse.com  Fact Sheet for Healthcare Providers: seriousbroker.it  This test is not yet approved or cleared by the [place]  FDA and has been authorized for detection and/or diagnosis of SARS-CoV-2 by FDA under an Emergency Use Authorization (EUA). This EUA will remain in effect (meaning this test can be used) for the duration of the COVID-19 declaration under Section 564(b)(1) of the Act, 21 U.S.C. section 360bbb-3(b)(1), unless the authorization is terminated or revoked.  Performed at [facility], [address], [place], [place] [address]          Radiology Studies: No results found.      Scheduled Meds:  antiseptic oral rinse  15 mL Mouth Rinse Q4H   budesonide -glycopyrrolate -formoterol   2 puff Inhalation BID   dexamethasone   4 mg Oral Q8H   famotidine   40 mg Oral BID   feeding supplement  237 mL Oral BID BM    saccharomyces boulardii  250 mg Oral BID   senna-docusate  1 tablet Oral QHS   Continuous Infusions:     LOS: 10 days   Time spent:  Elsie JAYSON Mont[place], DO [facility]  If 7PM-7AM, please contact night-coverage [email or web address]  08/22/2024, 7:24 AM      "

## 2024-08-22 NOTE — Progress Notes (Signed)
" °   08/22/24 1400  Spiritual Encounters  Type of Visit Attempt (pt unavailable)   Per referral from Chaplain Lamarr Sofia, I attempted to offer follow-up support to Mr Kohen for his HCPOA or other needs. At time of visit he was out of room.  Will attempt to visit later.  Lavanna Rog L. Delores HERO.Div "

## 2024-08-22 NOTE — Plan of Care (Signed)
   Problem: Elimination: Goal: Will not experience complications related to bowel motility Outcome: Progressing   Problem: Safety: Goal: Ability to remain free from injury will improve Outcome: Progressing

## 2024-08-22 NOTE — Progress Notes (Signed)
 OT Cancellation Note  Patient Details Name: VERLYN LAMBERT [name] DOB: 03-04-1951   Cancelled Treatment:    Reason Eval/Treat Not Completed: Patient at procedure or test/ unavailable Pt off unit receiving radiation. Will follow up with pt at a later date for OT.  Leita Howell, OTR/L,CBIS  Supplemental OT - [facility] and [facility] Secure Chat Preferred   08/22/2024, 2:33 PM

## 2024-08-23 ENCOUNTER — Ambulatory Visit: Admitting: Radiation Oncology

## 2024-08-23 ENCOUNTER — Encounter: Payer: Self-pay | Admitting: Radiation Oncology

## 2024-08-23 ENCOUNTER — Ambulatory Visit

## 2024-08-23 ENCOUNTER — Other Ambulatory Visit: Payer: Self-pay

## 2024-08-23 DIAGNOSIS — C7931 Secondary malignant neoplasm of brain: Secondary | ICD-10-CM | POA: Diagnosis not present

## 2024-08-23 LAB — RAD ONC ARIA SESSION SUMMARY
Course Elapsed Days: 5
Plan Fractions Treated to Date: 2
Plan Prescribed Dose Per Fraction: 3 Gy
Plan Total Fractions Prescribed: 9
Plan Total Prescribed Dose: 27 Gy
Reference Point Dosage Given to Date: 6 Gy
Reference Point Session Dosage Given: 3 Gy
Session Number: 3

## 2024-08-23 LAB — COMPREHENSIVE METABOLIC PANEL WITH GFR
ALT: 11 U/L (ref 0–44)
AST: 20 U/L (ref 15–41)
Albumin: 2.5 g/dL — ABNORMAL LOW (ref 3.5–5.0)
Alkaline Phosphatase: 137 U/L — ABNORMAL HIGH (ref 38–126)
Anion gap: 10 (ref 5–15)
BUN: 85 mg/dL — ABNORMAL HIGH (ref 8–23)
CO2: 27 mmol/L (ref 22–32)
Calcium: 9.4 mg/dL (ref 8.9–10.3)
Chloride: 103 mmol/L (ref 98–111)
Creatinine, Ser: 1.64 mg/dL — ABNORMAL HIGH (ref 0.61–1.24)
GFR, Estimated: 44 mL/min — ABNORMAL LOW
Glucose, Bld: 121 mg/dL — ABNORMAL HIGH (ref 70–99)
Potassium: 5.4 mmol/L — ABNORMAL HIGH (ref 3.5–5.1)
Sodium: 139 mmol/L (ref 135–145)
Total Bilirubin: 0.5 mg/dL (ref 0.0–1.2)
Total Protein: 5.9 g/dL — ABNORMAL LOW (ref 6.5–8.1)

## 2024-08-23 LAB — POTASSIUM
Potassium: 5.4 mmol/L — ABNORMAL HIGH (ref 3.5–5.1)
Potassium: 5.3 mmol/L — ABNORMAL HIGH (ref 3.5–5.1)
Potassium: 5.9 mmol/L — ABNORMAL HIGH (ref 3.5–5.1)
Potassium: 5.2 mmol/L — ABNORMAL HIGH (ref 3.5–5.1)

## 2024-08-23 LAB — CBC WITH DIFFERENTIAL/PLATELET
Abs Immature Granulocytes: 0.46 10*3/uL — ABNORMAL HIGH (ref 0.00–0.07)
Basophils Absolute: 0.1 10*3/uL (ref 0.0–0.1)
Basophils Relative: 0 %
Eosinophils Absolute: 0 10*3/uL (ref 0.0–0.5)
Eosinophils Relative: 0 %
HCT: 28.5 % — ABNORMAL LOW (ref 39.0–52.0)
Hemoglobin: 9.1 g/dL — ABNORMAL LOW (ref 13.0–17.0)
Immature Granulocytes: 2 %
Lymphocytes Relative: 2 %
Lymphs Abs: 0.4 10*3/uL — ABNORMAL LOW (ref 0.7–4.0)
MCH: 30.2 pg (ref 26.0–34.0)
MCHC: 31.9 g/dL (ref 30.0–36.0)
MCV: 94.7 fL (ref 80.0–100.0)
Monocytes Absolute: 1.3 10*3/uL — ABNORMAL HIGH (ref 0.1–1.0)
Monocytes Relative: 5 %
Neutro Abs: 25.2 10*3/uL — ABNORMAL HIGH (ref 1.7–7.7)
Neutrophils Relative %: 91 %
Platelets: 201 10*3/uL (ref 150–400)
RBC: 3.01 MIL/uL — ABNORMAL LOW (ref 4.22–5.81)
RDW: 16.8 % — ABNORMAL HIGH (ref 11.5–15.5)
Smear Review: NORMAL
WBC: 27.4 10*3/uL — ABNORMAL HIGH (ref 4.0–10.5)
nRBC: 0 % (ref 0.0–0.2)

## 2024-08-23 MED ORDER — FAMOTIDINE 20 MG PO TABS
40.0000 mg | ORAL_TABLET | Freq: Every day | ORAL | Status: DC
  Administered 2024-08-24: 40 mg via ORAL
  Filled 2024-08-23 (×2): qty 2

## 2024-08-23 MED ORDER — SODIUM CHLORIDE 0.9 % IV SOLN: INTRAVENOUS | Status: AC

## 2024-08-23 MED ORDER — ZOLPIDEM TARTRATE 5 MG PO TABS
5.0000 mg | ORAL_TABLET | Freq: Every evening | ORAL | Status: DC | PRN
  Administered 2024-08-23 – 2024-08-24 (×2): 5 mg via ORAL
  Filled 2024-08-23 (×2): qty 1

## 2024-08-23 MED ORDER — DRONABINOL 2.5 MG PO CAPS
2.5000 mg | ORAL_CAPSULE | Freq: Two times a day (BID) | ORAL | Status: DC
  Administered 2024-08-23 – 2024-08-24 (×4): 2.5 mg via ORAL
  Filled 2024-08-23 (×6): qty 1

## 2024-08-23 MED ORDER — RIVAROXABAN 10 MG PO TABS
10.0000 mg | ORAL_TABLET | Freq: Every day | ORAL | Status: DC
  Administered 2024-08-23 – 2024-08-24 (×2): 10 mg via ORAL
  Filled 2024-08-23 (×2): qty 1

## 2024-08-23 MED ORDER — OXYCODONE HCL 5 MG PO TABS: 5.0000 mg | ORAL_TABLET | Freq: Four times a day (QID) | ORAL | Status: DC | PRN

## 2024-08-23 MED ORDER — OXYCODONE HCL 5 MG PO TABS
5.0000 mg | ORAL_TABLET | Freq: Three times a day (TID) | ORAL | Status: DC | PRN
  Administered 2024-08-23: 5 mg via ORAL
  Filled 2024-08-23 (×2): qty 1

## 2024-08-23 MED ORDER — MEGESTROL ACETATE 400 MG/10ML PO SUSP
400.0000 mg | Freq: Two times a day (BID) | ORAL | Status: DC
  Administered 2024-08-23 – 2024-08-24 (×4): 400 mg via ORAL
  Filled 2024-08-23 (×5): qty 10

## 2024-08-23 NOTE — Progress Notes (Signed)
 Physical Therapy Treatment Patient Details Name: Jeffrey Norton [name] DOB: Oct 22, 1950 Today's Date: 08/23/2024   History of Present Illness 74 y.o. male presents 08/12/24 for acute onset AMS, aphasia, and generalized weakness. CT of the head showed multiple brain mass in the left frontal right occipital and right frontal lobe with vasogenic edema. Underwent IR biopsy of adrenal gland 1/20. PMH: CAD status post multiple PCIs, paroxysmal A-fib/flutter for which patient had previously declined anticoagulation, HFmrEF, pericarditis/pericardial effusion, CKD stage IIIa, HTN, HLD, PVD, GERD, GI bleed, chronic hypoxemic respiratory failure on 2 L home O2. History of right upper lung mass suspicious for malignancy, on recent PET scan done about a month ago showed stage IV primary bronchogenic carcinoma with mediastinal adenopathy and multiple bone and adrenal gland mets    PT Comments  Mod assist for supine to sit, min assist for sit to stand. Pt ambulated 16' with RW, no loss of balance, distance limited by fatigue. Pt tolerated increased activity level today however remains deconditioned and fatigues quickly.     If plan is discharge home, recommend the following: A little help with walking and/or transfers;A little help with bathing/dressing/bathroom;Assistance with cooking/housework;Assist for transportation;Help with stairs or ramp for entrance   Can travel by private vehicle        Equipment Recommendations  Rolling walker (2 wheels);BSC/3in1    Recommendations for Other Services       Precautions / Restrictions Precautions Precautions: Fall Recall of Precautions/Restrictions: Intact Precaution/Restrictions Comments: on 2L O2 baseline Restrictions Weight Bearing Restrictions Per Provider Order: No     Mobility  Bed Mobility Overal bed mobility: Needs Assistance Bed Mobility: Supine to Sit     Supine to sit: HOB elevated, Used rails, Mod assist     General bed mobility  comments: assist to raise trunk    Transfers Overall transfer level: Needs assistance Equipment used: Rolling walker (2 wheels) Transfers: Sit to/from Stand Sit to Stand: Min assist           General transfer comment: multimodal cues for positioning and technique. Increased time. Assist to power up, stabilize, control descent.    Ambulation/Gait Ambulation/Gait assistance: Contact guard assist Gait Distance (Feet): 16 Feet Assistive device: Rolling walker (2 wheels) Gait Pattern/deviations: Step-through pattern, Decreased stride length Gait velocity: decr     General Gait Details: distance limited by fatigue   Stairs             Wheelchair Mobility     Tilt Bed    Modified Rankin (Stroke Patients Only)       Balance Overall balance assessment: Needs assistance Sitting-balance support: Feet supported, No upper extremity supported Sitting balance-Leahy Scale: Fair     Standing balance support: Reliant on assistive device for balance, Bilateral upper extremity supported, During functional activity Standing balance-Leahy Scale: Poor Standing balance comment: Dependent on RW and external support                            Communication Communication Communication: Impaired Factors Affecting Communication: Hearing impaired  Cognition Arousal: Alert Behavior During Therapy: Flat affect   PT - Cognitive impairments: Attention, Awareness, Safety/Judgement, Problem solving                       PT - Cognition Comments: slow to respond to questions/commands Following commands: Impaired Following commands impaired: Follows one step commands with increased time    Cueing Cueing Techniques: Verbal cues,  Visual cues, Tactile cues  Exercises General Exercises - Lower Extremity Long Arc Quad: AROM, Both, Seated, 10 reps    General Comments        Pertinent Vitals/Pain Pain Assessment Faces Pain Scale: No hurt    Home Living                           Prior Function            PT Goals (current goals can now be found in the care plan section) Acute Rehab PT Goals Patient Stated Goal: Get stronger and remain independent PT Goal Formulation: With patient/family Time For Goal Achievement: 08/30/24 Potential to Achieve Goals: Good Progress towards PT goals: Progressing toward goals    Frequency    Min 2X/week      PT Plan      Co-evaluation PT/OT/SLP Co-Evaluation/Treatment: Yes Reason for Co-Treatment: Complexity of the patient's impairments (multi-system involvement);For patient/therapist safety;To address functional/ADL transfers PT goals addressed during session: Mobility/safety with mobility;Balance;Proper use of DME;Strengthening/ROM OT goals addressed during session: ADL's and self-care;Proper use of Adaptive equipment and DME;Strengthening/ROM      AM-PAC PT 6 Clicks Mobility   Outcome Measure  Help needed turning from your back to your side while in a flat bed without using bedrails?: A Little Help needed moving from lying on your back to sitting on the side of a flat bed without using bedrails?: A Little Help needed moving to and from a bed to a chair (including a wheelchair)?: A Little Help needed standing up from a chair using your arms (e.g., wheelchair or bedside chair)?: A Little Help needed to walk in hospital room?: A Little Help needed climbing 3-5 steps with a railing? : A Lot 6 Click Score: 17    End of Session Equipment Utilized During Treatment: Gait belt Activity Tolerance: Patient limited by fatigue Patient left: in chair;with call bell/phone within reach;with family/visitor present Nurse Communication: Mobility status PT Visit Diagnosis: Unsteadiness on feet (R26.81);Muscle weakness (generalized) (M62.81);Difficulty in walking, not elsewhere classified (R26.2)     Time: 8951-8894 PT Time Calculation (min) (ACUTE ONLY): 17 min  Charges:    $Gait Training:  8-22 mins PT General Charges $$ ACUTE PT VISIT: 1 Visit                     Sylvan Delon Copp PT 08/23/2024  Acute Rehabilitation Services  Office [phone]

## 2024-08-23 NOTE — Plan of Care (Signed)
   Problem: Coping: Goal: Level of anxiety will decrease Outcome: Progressing   Problem: Elimination: Goal: Will not experience complications related to urinary retention Outcome: Progressing   Problem: Pain Managment: Goal: General experience of comfort will improve and/or be controlled Outcome: Progressing

## 2024-08-23 NOTE — Progress Notes (Signed)
 " PROGRESS NOTE    Jeffrey Norton  FMW:[id number] DOB: [birth year] DOA: 08/12/2024 PCP: Dayna Motto, DO   Brief Narrative:  This 74 y.o. 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  Admitted for further evaluation.  Oncology and PCCM is consulted.  Assessment & Plan:   Principal Problem:   Metastasis to brain [facility]) Active Problems:   CHF exacerbation (HCC)   Metastatic primary lung cancer (HCC)   Sepsis (HCC)   Elevated troponin  Goals of care Discussion: - Given due to biopsy results of poorly differentiated adenocarcinoma and no option for chemotherapy,  oncology recommending ongoing radiation treatment as well as discussion with palliative care and hospice. - Discussed with patient's family and patient at bedside today, continue to be hopeful for improvement with radiation.  We discussed possible discharge home once his symptoms are more well-controlled and he is able to transport to radiation from home.   Hyperkalemia, rule out tumor lysis syndrome: AKI on CKD 3a, resolving - Unclear etiology although given proximity to radiation likely tumor lysis syndrome. - Continue normal saline @50cc /h given poor p.o. intake and ongoing hyperkalemia. - Creatinine baseline around 1.6(currently at baseline) - Repeat Lokelma , Kayexalate today - follow repeat labs, potassium slowly downtrending appropriately - EKG 1/26 without notable peaked T waves or dysrhythmia despite potassium of 6.3   Acute metabolic encephalopathy: > Improved. Brain mets with hemorrhagic change: vasogenic edema, resolving: Encephalopathy has improved markedly after initiation of steroids, approaching baseline per wife. Neurosurgery recommending radiation treatment, No indication at this time for  craniotomy Transferred to [facility] for radiation therapy to start 1/23.   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 Radiation oncology following as above -currently active with radiation treatment.    Chronic hypoxic respiratory failure: In the setting of COPD/lung mass. Chronically on 2 L nasal cannula at rest and with exertion.   COPD: Not in exacerbation. Continue bronchodilators/supportive care   Chronic HFrEF: Euvolemic.  Tolerated IV fluids well, follow clinically  Holding any diuretics in the setting of AKI as above.   History of CAD s/p multiple PCI-most recently in 2024: Without notable anginal symptoms. Hold home aspirin  secondary to anemia, no notable source of bleeding at this time.   PAF: Previously has refused anticoagulation Remains rate controlled off any rate control medications.   PAD: Asymptomatic, aspirin  on hold as above   Dysphagia: SLP following. Advancing diet as tolerated -currently on regular diet thin liquids.   Iron deficiency anemia. Hemoglobin stable. No active bleeding reported. Iron level significantly low -IV iron completed   Asterixis. Resolved.  Suspect secondary to uremia - now resolved TSH 1.4 WNL - free T41.1 Ammonia 25.    DVT prophylaxis: SCds Code Status: DNR Family Communication:Spouse at bed side Disposition Plan:     Status is: Inpatient Remains inpatient appropriate because: Severity of illness.    Consultants:  PCCM.  Oncology  Procedures:  Antimicrobials:  Anti-infectives (From admission, onward)    Start     Dose/Rate Route Frequency Ordered Stop   08/13/24 2200  vancomycin  (VANCOCIN ) IVPB 1000 mg/200 mL premix  Status:  Discontinued        1,000 mg 200 mL/hr  over 60  Minutes Intravenous Every 24 hours 08/13/24 0038 08/13/24 0636   08/13/24 1000  ceFEPIme  (MAXIPIME ) 2 g in sodium chloride  0.9 % 100 mL IVPB  Status:  Discontinued        2 g 200 mL/hr over 30 Minutes Intravenous Every 12 hours 08/13/24 0038 08/13/24 0636   08/13/24 0200  metroNIDAZOLE  (FLAGYL ) IVPB 500 mg  Status:  Discontinued        500 mg 100 mL/hr over 60 Minutes Intravenous Every 12 hours 08/13/24 0025 08/13/24 0636   08/12/24 2045  vancomycin  (VANCOREADY) IVPB 1250 mg/250 mL        1,250 mg 166.7 mL/hr over 90 Minutes Intravenous  Once 08/12/24 2042 08/12/24 2328   08/12/24 2045  ceFEPIme  (MAXIPIME ) 2 g in sodium chloride  0.9 % 100 mL IVPB        2 g 200 mL/hr over 30 Minutes Intravenous Once 08/12/24 2042 08/12/24 2224        Subjective: Patient was seen and examined at bed side. No overnight events, He reports pain is reasonably controlled, lying comfortably on bed with supplemental oxygen .  Objective: Vitals:   08/22/24 2221 08/23/24 0500 08/23/24 0638 08/23/24 0909  BP: (!) 140/70  122/65   Pulse: 78  71   Resp: 18  18   Temp: (!) 97.5 F (36.4 C)  97.7 F (36.5 C)   TempSrc:      SpO2: 100%  100% 99%  Weight:  67.2 kg    Height:        Intake/Output Summary (Last 24 hours) at 08/23/2024 1111 Last data filed at 08/23/2024 0945 Gross per 24 hour  Intake 1186.83 ml  Output 1400 ml  Net -213.17 ml   Filed Weights   08/20/24 0714 08/21/24 0500 08/23/24 0500  Weight: 67.8 kg 68.1 kg 67.2 kg    Examination:  General exam: Appears calm and comfortable, not in any acute distress, deconditioned. Respiratory system: Clear to auscultation. Respiratory effort normal.  RR 15 Cardiovascular system: S1 & S2 heard, RRR. No JVD, murmurs, rubs, gallops or clicks.  Gastrointestinal system: Abdomen is non distended, soft and non tender.  Normal bowel sounds heard. Central nervous system: Alert and oriented x 3. No focal neurological deficits. Extremities: No edema, no  cyanosis, no clubbing. Skin: No rashes, lesions or ulcers Psychiatry: Judgement and insight appear normal. Mood & affect appropriate.    Data Reviewed: I have personally reviewed following labs and imaging studies  CBC: Recent Labs  Lab 08/17/24 0305 08/17/24 1339 08/18/24 0835 08/19/24 0550 08/22/24 0110 08/23/24 0654  WBC 22.9* 25.0* 26.4* 26.9* 34.7* 27.4*  NEUTROABS 21.2*  --   --  25.1* 32.0* 25.2*  HGB 8.6* 8.9* 9.1* 9.1* 10.0* 9.1*  HCT 27.9* 28.8* 30.5* 30.7* 31.1* 28.5*  MCV 93.0 94.1 95.9 96.2 93.4 94.7  PLT 279 285 301 281 267 201   Basic Metabolic Panel: Recent Labs  Lab 08/16/24 1427 08/17/24 0305 08/19/24 0550 08/21/24 0852 08/21/24 1430 08/21/24 1636 08/21/24 2056 08/22/24 0110 08/22/24 1502 08/22/24 2100 08/23/24 0654  NA 138   < > 135 136 135  --   --  136  --   --  139  K 4.6   < > 5.4* 6.3* 6.0*   < > 5.4* 5.7* 5.6* 5.0 5.4*  5.4*  CL 102   < > 103 102 100  --   --  98  --   --  103  CO2 27   < >  25 26 25   --   --  28  --   --  27  GLUCOSE 148*   < > 119* 127* 131*  --   --  133*  --   --  121*  BUN 75*   < > 47* 60* 67*  --   --  78*  --   --  85*  CREATININE 1.49*   < > 1.04 0.62 1.41*  --   --  1.59*  --   --  1.64*  CALCIUM  9.3   < > 9.6 9.9 10.2  --   --  10.1  --   --  9.4  MG 2.3  --   --   --   --   --   --   --   --   --   --    < > = values in this interval not displayed.   GFR: Estimated Creatinine Clearance: 38.1 mL/min (A) (by C-G formula based on SCr of 1.64 mg/dL (H)). Liver Function Tests: Recent Labs  Lab 08/17/24 0305 08/19/24 0550 08/21/24 0852 08/23/24 0654  AST 15 21 22 20   ALT 7 9 21 11   ALKPHOS 126 124 115 137*  BILITOT 0.3 0.4 0.4 0.5  PROT 5.8* 6.0* 5.9* 5.9*  ALBUMIN 2.5* 2.6* 2.7* 2.5*   No results for input(s): LIPASE, AMYLASE in the last 168 hours. Recent Labs  Lab 08/16/24 1427  AMMONIA 25   Coagulation Profile: No results for input(s): INR, PROTIME in the last 168 hours. Cardiac  Enzymes: No results for input(s): CKTOTAL, CKMB, CKMBINDEX, TROPONINI in the last 168 hours. BNP (last 3 results) Recent Labs    08/12/24 1906  PROBNP 5,355.0*   HbA1C: No results for input(s): HGBA1C in the last 72 hours. CBG: Recent Labs  Lab 08/21/24 1519  GLUCAP 98   Lipid Profile: No results for input(s): CHOL, HDL, LDLCALC, TRIG, CHOLHDL, LDLDIRECT in the last 72 hours. Thyroid  Function Tests: No results for input(s): TSH, T4TOTAL, FREET4, T3FREE, THYROIDAB in the last 72 hours. Anemia Panel: No results for input(s): VITAMINB12, FOLATE, FERRITIN, TIBC, IRON, RETICCTPCT in the last 72 hours. Sepsis Labs: No results for input(s): PROCALCITON, LATICACIDVEN in the last 168 hours.  Recent Results (from the past 240 hours)  Resp panel by RT-PCR (RSV, Flu A&B, Covid) Anterior Nasal Swab     Status: None   Collection Time: 08/14/24  3:32 AM   Specimen: Anterior Nasal Swab  Result Value Ref Range Status   SARS Coronavirus 2 by RT PCR NEGATIVE NEGATIVE Final   Influenza A by PCR NEGATIVE NEGATIVE Final   Influenza B by PCR NEGATIVE NEGATIVE Final    Comment: (NOTE) The Xpert Xpress SARS-CoV-2/FLU/RSV plus assay is intended as an aid in the diagnosis of influenza from Nasopharyngeal swab specimens and should not be used as a sole basis for treatment. Nasal washings and aspirates are unacceptable for Xpert Xpress SARS-CoV-2/FLU/RSV testing.  Fact Sheet for Patients: bloggercourse.com  Fact Sheet for Healthcare Providers: seriousbroker.it  This test is not yet approved or cleared by the [place]  FDA and has been authorized for detection and/or diagnosis of SARS-CoV-2 by FDA under an Emergency Use Authorization (EUA). This EUA will remain in effect (meaning this test can be used) for the duration of the COVID-19 declaration under Section 564(b)(1) of the Act, 21  U.S.C. section 360bbb-3(b)(1), unless the authorization is terminated or revoked.     Resp Syncytial Virus by PCR NEGATIVE NEGATIVE Final  Comment: (NOTE) Fact Sheet for Patients: bloggercourse.com  Fact Sheet for Healthcare Providers: seriousbroker.it  This test is not yet approved or cleared by the [place]  FDA and has been authorized for detection and/or diagnosis of SARS-CoV-2 by FDA under an Emergency Use Authorization (EUA). This EUA will remain in effect (meaning this test can be used) for the duration of the COVID-19 declaration under Section 564(b)(1) of the Act, 21 U.S.C. section 360bbb-3(b)(1), unless the authorization is terminated or revoked.  Performed at [facility], [address], [place], [place] [address]    Radiology Studies: No results found.  Scheduled Meds:  antiseptic oral rinse  15 mL Mouth Rinse Q4H   budesonide -glycopyrrolate -formoterol   2 puff Inhalation BID   dexamethasone   4 mg Oral Q12H   dronabinol   2.5 mg Oral BID AC   famotidine   40 mg Oral BID   feeding supplement  237 mL Oral BID BM   lactulose   10 g Oral BID   megestrol   400 mg Oral BID   rivaroxaban   10 mg Oral Daily   saccharomyces boulardii  250 mg Oral BID   senna-docusate  1 tablet Oral QHS   sodium zirconium cyclosilicate   5 g Oral TID   Continuous Infusions:   LOS: 11 days    Time spent: 50 mins    Darcel Dawley, MD [facility]   If 7PM-7AM, please contact night-coverage  "

## 2024-08-23 NOTE — Progress Notes (Addendum)
" °  Radiation Oncology         [phone] ________________________________  Name: Jeffrey Norton  FMW:[id number]  Date of Service: 08/23/24  DOB: 05-Jul-1951   Steroid Taper Instructions   You currently have a prescription for Dexamethasone  4 mg Tablets.   Beginning 09/01/24  Take a 4 mg tablet twice a day  Beginning 09/08/24: Take 1/2 of a tablet (which is 2 mg) twice a day  Beginning 09/15/24: Take 1/2 of a tablet (which is 2 mg) once a day  Beginning 09/22/24: Take 1/2 of a tablet (which is 2 mg) every other day and stop on 09/28/24.   Please call our office if you have any headaches, visual changes, uncontrolled movements, extremity weakness, nausea or vomiting.    "

## 2024-08-23 NOTE — Progress Notes (Signed)
 There was not much change for Jeffrey Norton.  He did have radiation yesterday.  I do appreciate Radiation Oncology helping out.  His appetite is just not great.  He is not eating that much.  I will try him on some Megace  and some low-dose Marinol .  He has had problems with his potassium.  I think he got some Lokelma  yesterday.  We will see what his labs look like today.  There is no dyspnea.  He has had no bleeding.  He has had no nausea or vomiting.  He says he is not having any problems with pain.  His vital signs are stable.  Temperature 97.7.  Pulse 71.  Blood pressure 122/65.  His head and neck exam shows no ocular or oral lesions.  He has temporal muscle wasting.  There is no adenopathy in the neck.  Lungs are clear on the left side.  He has decreased breath sounds on the right side.  Cardiac exam regular rate and rhythm.  He has no murmurs.  Abdomen is soft.  He has no fluid wave.  There is no guarding or rebound tenderness.  He has no palpable liver or spleen tip.  Extremity shows some muscle atrophy in upper or lower extremities.  Neurological exam shows no focal neurological deficits.  Again, this is all about quality of life.  He is not a candidate for systemic therapy.  Radiation therapy is being given so that he will hopefully have decreased neurological issues and also decreased pulmonary issues with respect to the right lung.  We will try to get his appetite improved.  I do appreciate everybody's help.  I know his wife is very appreciative of the staff on 6 E.   Jeralyn Crease, MD  Philippians 4:13

## 2024-08-24 ENCOUNTER — Ambulatory Visit

## 2024-08-24 ENCOUNTER — Other Ambulatory Visit: Payer: Self-pay

## 2024-08-24 DIAGNOSIS — Z79899 Other long term (current) drug therapy: Secondary | ICD-10-CM

## 2024-08-24 DIAGNOSIS — R4589 Other symptoms and signs involving emotional state: Secondary | ICD-10-CM

## 2024-08-24 DIAGNOSIS — Z515 Encounter for palliative care: Secondary | ICD-10-CM

## 2024-08-24 DIAGNOSIS — Z7189 Other specified counseling: Secondary | ICD-10-CM

## 2024-08-24 DIAGNOSIS — G893 Neoplasm related pain (acute) (chronic): Secondary | ICD-10-CM

## 2024-08-24 DIAGNOSIS — C7931 Secondary malignant neoplasm of brain: Secondary | ICD-10-CM | POA: Diagnosis not present

## 2024-08-24 LAB — CBC WITH DIFFERENTIAL/PLATELET
Abs Immature Granulocytes: 0.95 10*3/uL — ABNORMAL HIGH (ref 0.00–0.07)
Basophils Absolute: 0.1 10*3/uL (ref 0.0–0.1)
Basophils Relative: 0 %
Eosinophils Absolute: 0 10*3/uL (ref 0.0–0.5)
Eosinophils Relative: 0 %
HCT: 29.9 % — ABNORMAL LOW (ref 39.0–52.0)
Hemoglobin: 8.8 g/dL — ABNORMAL LOW (ref 13.0–17.0)
Immature Granulocytes: 3 %
Lymphocytes Relative: 1 %
Lymphs Abs: 0.3 10*3/uL — ABNORMAL LOW (ref 0.7–4.0)
MCH: 29.2 pg (ref 26.0–34.0)
MCHC: 29.4 g/dL — ABNORMAL LOW (ref 30.0–36.0)
MCV: 99.3 fL (ref 80.0–100.0)
Monocytes Absolute: 1.5 10*3/uL — ABNORMAL HIGH (ref 0.1–1.0)
Monocytes Relative: 5 %
Neutro Abs: 26.2 10*3/uL — ABNORMAL HIGH (ref 1.7–7.7)
Neutrophils Relative %: 91 %
Platelets: 198 10*3/uL (ref 150–400)
RBC: 3.01 MIL/uL — ABNORMAL LOW (ref 4.22–5.81)
RDW: 17.2 % — ABNORMAL HIGH (ref 11.5–15.5)
Smear Review: NORMAL
WBC: 29 10*3/uL — ABNORMAL HIGH (ref 4.0–10.5)
nRBC: 0 % (ref 0.0–0.2)

## 2024-08-24 LAB — RAD ONC ARIA SESSION SUMMARY
Course Elapsed Days: 6
Plan Fractions Treated to Date: 3
Plan Prescribed Dose Per Fraction: 3 Gy
Plan Total Fractions Prescribed: 9
Plan Total Prescribed Dose: 27 Gy
Reference Point Dosage Given to Date: 9 Gy
Reference Point Session Dosage Given: 3 Gy
Session Number: 4

## 2024-08-24 LAB — COMPREHENSIVE METABOLIC PANEL WITH GFR
ALT: 13 U/L (ref 0–44)
AST: 36 U/L (ref 15–41)
Albumin: 2.6 g/dL — ABNORMAL LOW (ref 3.5–5.0)
Alkaline Phosphatase: 159 U/L — ABNORMAL HIGH (ref 38–126)
Anion gap: 10 (ref 5–15)
BUN: 76 mg/dL — ABNORMAL HIGH (ref 8–23)
CO2: 26 mmol/L (ref 22–32)
Calcium: 9.4 mg/dL (ref 8.9–10.3)
Chloride: 106 mmol/L (ref 98–111)
Creatinine, Ser: 1.5 mg/dL — ABNORMAL HIGH (ref 0.61–1.24)
GFR, Estimated: 49 mL/min — ABNORMAL LOW
Glucose, Bld: 147 mg/dL — ABNORMAL HIGH (ref 70–99)
Potassium: 5.2 mmol/L — ABNORMAL HIGH (ref 3.5–5.1)
Sodium: 141 mmol/L (ref 135–145)
Total Bilirubin: 0.6 mg/dL (ref 0.0–1.2)
Total Protein: 6.4 g/dL — ABNORMAL LOW (ref 6.5–8.1)

## 2024-08-24 LAB — MAGNESIUM: Magnesium: 2.4 mg/dL (ref 1.7–2.4)

## 2024-08-24 LAB — PHOSPHORUS: Phosphorus: 4.5 mg/dL (ref 2.5–4.6)

## 2024-08-24 MED ORDER — OXYCODONE HCL 5 MG PO TABS
10.0000 mg | ORAL_TABLET | ORAL | Status: DC | PRN
  Administered 2024-08-24: 10 mg via ORAL
  Filled 2024-08-24 (×2): qty 2

## 2024-08-24 MED ORDER — HYDROMORPHONE HCL 1 MG/ML IJ SOLN
0.5000 mg | INTRAMUSCULAR | Status: DC | PRN
  Administered 2024-08-25: 1 mg via INTRAVENOUS
  Filled 2024-08-24: qty 1

## 2024-08-24 MED ORDER — HYDROMORPHONE HCL 1 MG/ML IJ SOLN
1.0000 mg | Freq: Every day | INTRAMUSCULAR | Status: DC
  Filled 2024-08-24: qty 1

## 2024-08-24 MED ORDER — FENTANYL 12 MCG/HR TD PT72
1.0000 | MEDICATED_PATCH | TRANSDERMAL | Status: DC
  Administered 2024-08-24: 1 via TRANSDERMAL
  Filled 2024-08-24: qty 1

## 2024-08-24 NOTE — Progress Notes (Signed)
 " PROGRESS NOTE    Jeffrey Norton  FMW:[id number] DOB: [birth year] DOA: 08/12/2024 PCP: Dayna Motto, DO   Brief Narrative:  This 74 y.o. 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  Admitted for further evaluation.  Oncology and PCCM is consulted.  Assessment & Plan:   Principal Problem:   Metastasis to brain [facility]) Active Problems:   CHF exacerbation (HCC)   Metastatic primary lung cancer (HCC)   Sepsis (HCC)   Elevated troponin  Goals of care Discussion: - Given due to biopsy results of poorly differentiated adenocarcinoma and no option for chemotherapy,  Oncology recommending ongoing radiation treatment as well as discussion with palliative care and hospice. - Discussed with patient's family and patient at bedside , continue to be hopeful for improvement with radiation.   - We discussed possible discharge home once his symptoms are more well-controlled and he is able to transport to radiation from home.   Hyperkalemia, rule out tumor lysis syndrome: AKI on CKD 3a, resolving - Unclear etiology although given proximity to radiation likely tumor lysis syndrome. - Continue normal saline @50cc /h given poor p.o. intake and ongoing hyperkalemia. - Creatinine baseline around 1.6(currently at baseline) - Repeat Lokelma , Kayexalate  - follow repeat labs, potassium slowly downtrending appropriately - EKG 1/26 without notable peaked T waves or dysrhythmia despite potassium of 6.3 - Potassium remains around 5.2 , Completed  Lokelma   x 6 doses.  - Recheck Potasium in am   Acute metabolic encephalopathy: > Improved. Brain mets with hemorrhagic change: vasogenic edema, resolving: Encephalopathy has improved markedly after initiation of steroids, approaching baseline per  wife. Neurosurgery recommending radiation treatment, No indication at this time for craniotomy Transferred to [facility] for radiation therapy to start 1/23. Radiation oncology wanted to continue radiation, starting brain radiation tomorrow.   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 Radiation oncology following as above -currently active with radiation treatment.    Chronic hypoxic respiratory failure: In the setting of COPD/lung mass. Chronically on 2 L nasal cannula at rest and with exertion.   COPD: Not in exacerbation. Continue bronchodilators/supportive care.   Chronic HFrEF: Euvolemic.  Tolerated IV fluids well, follow clinically . Holding any diuretics in the setting of AKI as above.   History of CAD s/p multiple PCI-most recently in 2024: Without notable anginal symptoms. Hold home aspirin  secondary to anemia, no notable source of bleeding at this time.   PAF: Previously has refused anticoagulation Remains rate controlled off any rate control medications.   PAD: Asymptomatic, aspirin  on hold as above.   Dysphagia: SLP following. Advancing diet as tolerated -currently on regular diet thin liquids.   Iron deficiency anemia. Hemoglobin stable. No active bleeding reported. Iron level significantly low -IV iron completed   Asterixis. Resolved.  Suspect secondary to uremia - now resolved TSH 1.4 WNL - free T41.1 Ammonia 25.    DVT prophylaxis: SCds Code Status: DNR Family Communication:Spouse at bed side Disposition Plan:     Status is: Inpatient Remains inpatient appropriate because: Severity of illness.  Patient needs a radiation treatment to the brain and to the  mets.  He is not medically ready for discharge yet.  Consultants:  PCCM.  Oncology  Procedures:  Antimicrobials:  Anti-infectives (From admission, onward)    Start     Dose/Rate Route Frequency Ordered Stop   08/13/24 2200  vancomycin  (VANCOCIN ) IVPB 1000 mg/200 mL premix  Status:  Discontinued        1,000 mg 200 mL/hr over 60 Minutes Intravenous Every 24 hours 08/13/24 0038 08/13/24 0636   08/13/24 1000  ceFEPIme  (MAXIPIME ) 2 g in sodium chloride  0.9 % 100 mL IVPB  Status:  Discontinued        2 g 200 mL/hr over 30 Minutes Intravenous Every 12 hours 08/13/24 0038 08/13/24 0636   08/13/24 0200  metroNIDAZOLE  (FLAGYL ) IVPB 500 mg  Status:  Discontinued        500 mg 100 mL/hr over 60 Minutes Intravenous Every 12 hours 08/13/24 0025 08/13/24 0636   08/12/24 2045  vancomycin  (VANCOREADY) IVPB 1250 mg/250 mL        1,250 mg 166.7 mL/hr over 90 Minutes Intravenous  Once 08/12/24 2042 08/12/24 2328   08/12/24 2045  ceFEPIme  (MAXIPIME ) 2 g in sodium chloride  0.9 % 100 mL IVPB        2 g 200 mL/hr over 30 Minutes Intravenous Once 08/12/24 2042 08/12/24 2224        Subjective: Patient was seen and examined at bed side. No overnight events, He reports pain is reasonably controlled, lying comfortably on bed with supplemental oxygen . He just came back from radiation treatment.  Objective: Vitals:   08/23/24 1331 08/23/24 2026 08/24/24 0414 08/24/24 0424  BP: 131/73 138/78  134/70  Pulse: 88 93  100  Resp: 16 18  18   Temp: 97.6 F (36.4 C) (!) 97.5 F (36.4 C)  (!) 97.5 F (36.4 C)  TempSrc: Axillary Oral  Axillary  SpO2: 99% 97%  91%  Weight:   63.5 kg   Height:        Intake/Output Summary (Last 24 hours) at 08/24/2024 1207 Last data filed at 08/24/2024 9385 Gross per 24 hour  Intake 660.07 ml  Output 750 ml  Net -89.93 ml   Filed Weights   08/21/24 0500 08/23/24 0500 08/24/24 0414  Weight: 68.1 kg 67.2 kg 63.5 kg    Examination:  General exam: Appears calm and comfortable, not in any acute distress,  deconditioned. Respiratory system: CTA Bilaterally . Respiratory effort normal.  RR 14 Cardiovascular system: S1 & S2 heard, RRR. No JVD, murmurs, rubs, gallops or clicks.  Gastrointestinal system: Abdomen is non distended, soft and non tender.  Normal bowel sounds heard. Central nervous system: Alert and oriented x 3. No focal neurological deficits. Extremities: No edema, no cyanosis, no clubbing. Skin: No rashes, lesions or ulcers Psychiatry: Judgement and insight appear normal. Mood & affect appropriate.    Data Reviewed: I have personally reviewed following labs and imaging studies  CBC: Recent Labs  Lab 08/18/24 0835 08/19/24 0550 08/22/24 0110 08/23/24 0654 08/24/24 0613  WBC 26.4* 26.9* 34.7* 27.4* 29.0*  NEUTROABS  --  25.1* 32.0* 25.2* 26.2*  HGB 9.1* 9.1* 10.0* 9.1* 8.8*  HCT 30.5* 30.7* 31.1* 28.5* 29.9*  MCV 95.9 96.2 93.4 94.7 99.3  PLT 301 281 267 201 198   Basic Metabolic Panel: Recent Labs  Lab 08/21/24 0852 08/21/24 1430 08/21/24 1636 08/22/24 0110 08/22/24 1502 08/23/24 0654 08/23/24 1249 08/23/24 1656 08/23/24 2108 08/24/24 0613  NA 136 135  --  136  --  139  --   --   --  141  K 6.3*  6.0*   < > 5.7*   < > 5.4*  5.4* 5.3* 5.9* 5.2* 5.2*  CL 102 100  --  98  --  103  --   --   --  106  CO2 26 25  --  28  --  27  --   --   --  26  GLUCOSE 127* 131*  --  133*  --  121*  --   --   --  147*  BUN 60* 67*  --  78*  --  85*  --   --   --  76*  CREATININE 0.62 1.41*  --  1.59*  --  1.64*  --   --   --  1.50*  CALCIUM  9.9 10.2  --  10.1  --  9.4  --   --   --  9.4  MG  --   --   --   --   --   --   --   --   --  2.4  PHOS  --   --   --   --   --   --   --   --   --  4.5   < > = values in this interval not displayed.   GFR: Estimated Creatinine Clearance: 39.4 mL/min (A) (by C-G formula based on SCr of 1.5 mg/dL (H)). Liver Function Tests: Recent Labs  Lab 08/19/24 0550 08/21/24 0852 08/23/24 0654 08/24/24 0613  AST 21 22 20  36  ALT 9 21 11 13    ALKPHOS 124 115 137* 159*  BILITOT 0.4 0.4 0.5 0.6  PROT 6.0* 5.9* 5.9* 6.4*  ALBUMIN 2.6* 2.7* 2.5* 2.6*   No results for input(s): LIPASE, AMYLASE in the last 168 hours. No results for input(s): AMMONIA in the last 168 hours.  Coagulation Profile: No results for input(s): INR, PROTIME in the last 168 hours. Cardiac Enzymes: No results for input(s): CKTOTAL, CKMB, CKMBINDEX, TROPONINI in the last 168 hours. BNP (last 3 results) Recent Labs    08/12/24 1906  PROBNP 5,355.0*   HbA1C: No results for input(s): HGBA1C in the last 72 hours. CBG: Recent Labs  Lab 08/21/24 1519  GLUCAP 98   Lipid Profile: No results for input(s): CHOL, HDL, LDLCALC, TRIG, CHOLHDL, LDLDIRECT in the last 72 hours. Thyroid  Function Tests: No results for input(s): TSH, T4TOTAL, FREET4, T3FREE, THYROIDAB in the last 72 hours. Anemia Panel: No results for input(s): VITAMINB12, FOLATE, FERRITIN, TIBC, IRON, RETICCTPCT in the last 72 hours. Sepsis Labs: No results for input(s): PROCALCITON, LATICACIDVEN in the last 168 hours.  No results found for this or any previous visit (from the past 240 hours).  Radiology Studies: No results found.  Scheduled Meds:  antiseptic oral rinse  15 mL Mouth Rinse Q4H   budesonide -glycopyrrolate -formoterol   2 puff Inhalation BID   dexamethasone   4 mg Oral Q12H   dronabinol   2.5 mg Oral BID AC   famotidine   40 mg Oral Daily   feeding supplement  237 mL Oral BID BM   fentaNYL   1 patch Transdermal Q72H   lactulose   10 g Oral BID   megestrol   400 mg Oral BID   rivaroxaban   10 mg Oral Daily   saccharomyces boulardii  250 mg Oral BID   senna-docusate  1 tablet Oral QHS   Continuous Infusions:  sodium chloride  40 mL/hr at 08/24/24 0614     LOS: 12 days    Time spent: 50 mins  Darcel Dawley, MD [facility]   If 7PM-7AM, please contact night-coverage  "

## 2024-08-24 NOTE — Progress Notes (Signed)
 Overall, there really is no change in his status.  He may have eaten a little bit better yesterday.  The big issue is that he is having discomfort over on his right side of the chest.  This is after his radiation therapy.  I think a Duragesic  patch would not be a bad idea for him.  I would also make sure that he gets some IV Dilaudid  before he goes down to radiation.  He has had no bleeding.  He has had no nausea or vomiting.  His labs show a sodium 141.  Potassium 5.2.  BUN 76 creatinine 1.5.  Calcium  9.4 with an albumin of 2.6.  Magnesium is 2.4.  His white cell count is 29.  Hemoglobin 8.8.  Platelet count 198,000.  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  I know that Radiation Oncology is doing a great job with him.  He has had no change in bowel or bladder habits.  His vital signs show temperature 97.5.  Pulse 100.  Blood pressure 134/70.  His head and neck exam shows no ocular or oral lesions.  There is no oral thrush.  His lungs are clear on the left side.  He has decreased on the right side.  Cardiac exam regular rate and rhythm.  He has 1/6 systolic ejection murmur.  Abdomen soft.  Bowel sounds are present.  Bowel sounds might be little bit decreased.  He has no fluid wave.  There is no palpable liver or spleen tip.  Extremities shows muscle atrophy in upper and lower extremities.  Neurological exam is nonfocal.  For right now, he will continue his radiation therapy.  Hopefully, the Duragesic  patch will help.  I am glad that his renal function is little bit better.  His potassium is holding steady.  As always, we had a very good prayer.  Jeralyn Crease, MD  1 [place] 2:2

## 2024-08-24 NOTE — Progress Notes (Signed)
 " Daily Progress Note   Patient Name: Jeffrey Norton       Date: 08/24/2024 DOB: Dec 10, 1950  Age: 74 y.o. MRN#: [id number] Attending Physician: Leotis Bogus, MD Primary Care Physician: Dayna Motto, DO Admit Date: 08/12/2024 Length of Stay: 12 days  Reason for Follow-up: Establishing goals of care  Subjective:   CC: Patient minimally opening eyes during visit.  Spoke with patient's wife and daughter at bedside.  Following up regarding goals of care.  Reviewed hospitalist documentation from today.  Continuing to monitor potassium which remains slightly elevated.  Radiation oncology wanting to continue radiation with plan to start brain radiation tomorrow.  Reviewed oncologist note from today as well.  Oncologist starting patient on fentanyl  patch for long-acting opioid management. Reviewed patient's CMP from today noting patient's potassium of 5.2, GFR estimated at 49, and albumin low at 2.6. At time of EMR review in past 24 hours patient has received oral oxycodone  5 mg x 1 dose. Patient ordered to start transdermal fentanyl  patch at 12 mcg/h today. Discussed care with RN for updates.  Patient went for radiation earlier this morning and has been sleepy since then.  Presented to bedside patient.  Patient laying in bed throughout visit overall lethargic and minimally opening eyes at times.  Patient's wife and daughter were present at bedside.  Introduced myself as a member of the palliative medicine team.  Able to follow-up on patient's symptom management at that time.  Wife explained patient had a rough night after radiation due to severe worsening of headache type pain.  She noted that the as needed oxycodone  5 mg dose did not help patient's pain at all.  Acknowledged this and noted could further increase as needed dose of oxycodone .  Also spent time explaining the use of as needed medication and how oral medication would take approximately 30 to 45 minutes to get into patient's system versus  IV medication would be used for severe breakthrough pain.  Also acknowledged that oncologist had started long-acting transdermal fentanyl  patch today.  Patient has had minimal opioid requirements.  Encouraged family to watch closely for worsening lethargy, decreased respiratory rate, or worsening confusion though know the symptoms could be difficult to differentiate from symptoms he is already having from the underlying cancer and radiation.  Noted could further adjust pain medications if needed.  With permission, also inquired about planning moving forward.  Inquired about plans for support after patient finishes radiation.  Wife noted hospice had been mentioned though not discussed in detail.  Spent time explaining the philosophy of hospice.  Also spent time providing generalities about hospice support at home.  Did explain overall the team that would come to support her and the patient at home though also explained that no one from hospice would be present 24/7 so majority of caregiving would fall to family.  Did also explain that sometimes families need to pay out-of-pocket to have further caregivers at home to help family.  Also spent time explaining long-term care support with hospice which would also incur an out-of-pocket payment for room and board.  And spent time explaining inpatient hospice.  Discussed how to go to inpatient hospice, patient would have to be excepted by the medical director for management of essentially severe symptoms that could not be managed in the home setting such as with IV medications.  With patient continuing radiation at this time, unsure what level of care patient would be appropriate for.  Did inquire where wife would want inpatient hospice  if patient was to be evaluated and appropriate for this.  She noted they live in [place] and she would want patient to remain here in [place] for hospice.  Noted there is only 1 inpatient hospice facility here in [place] which is  called [facility] and owned by [employer].  Discussed how [facility] liaison would have to evaluate patient to determine about appropriateness for inpatient hospice versus home with hospice.  Spent time answering questions as able regarding this.  Did also spend time discussing care moving forward.  Explained that if patient and family do not feel that radiation is leading to quality time for the patient they can always discontinue radiation and focus on comfort at that point.  Noted palliative medicine team will continue to follow to support discussions regarding care moving forward.  Spent time providing emotional support.  All questions answered.  Noted palliative medicine to continue following patient's medical journey.  Discussed care with hospitalist, RN, TOC, and oncologist to update after visit.  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  Patient receiving radiation to assist with symptom management.  Oncology following and noting patient is not a candidate for systemic therapies.  Palliative medicine team following along to assist with goals of care and symptom management.  Recommendations/Plan: # Complex medical decision  making/goals of care:  - Patient unable to engage in conversation today due to medical status.  -Discussed care with patient's wife and daughter as detailed above in HPI.  At this time continuing appropriate medical management.  Patient continuing to receive radiation.  Did discuss that if at any point patient or family feels that patient is not getting quality time from radiation, can always decide to discontinue this and focus on patient's comfort.  Also discussed philosophy of hospice and different places where a person could receive hospice such as at home versus being accepted to inpatient hospice.  Wife stated they would want patient to remain here in [place] if evaluated for hospice and so would want involvement up [facility] for this.  Have discussed with TOC.  Noted palliative medicine team will continue to follow along with patient's medical journey.  -  Code Status: Limited: Do not attempt resuscitation (DNR) -DNR-LIMITED -Do Not Intubate/DNI   # Symptom management: Patient is receiving these palliative interventions for symptom management with an intent to improve quality of life.   - Pain, acute and severe in setting of metastatic oncogenic carcinoma with disease to bone and brain   - Increase oxycodone  to 10 mg every 4 hours as needed   - Start IV Dilaudid  0.5-1 mg every 4 hours as needed breakthrough pain   - Patient has been started on transdermal  fentanyl  12 mcg/h every 72 hour by oncologist on 08/24/2024  # Psychosocial Support:  - Wife, daughters, son  # Discharge Planning: To Be Determined  Discussed with: Patient's wife and daughter at bedside, pallidus, RN, TOC  Thank you for allowing the palliative care team to participate in the care Jerel JONETTA Feathers.  Tinnie Radar, DO Palliative Care Provider PMT # [phone]  If patient remains symptomatic despite maximum doses, please call PMT at [phone] between 0700 and 1900. Outside of these hours, please call attending,  as PMT does not have night coverage.  Billing based on MDM: High  Problems Addressed: One or more chronic illnesses with severe exacerbation, progression, or side effects of treatment.  Risks: Parenteral controlled substances   "

## 2024-08-24 NOTE — Plan of Care (Signed)
" °  Problem: Nutrition: Goal: Adequate nutrition will be maintained Outcome: Not Progressing   Problem: Coping: Goal: Level of anxiety will decrease Outcome: Not Progressing   Problem: Skin Integrity: Goal: Risk for impaired skin integrity will decrease Outcome: Not Progressing   "

## 2024-08-24 NOTE — Plan of Care (Signed)
   Problem: Activity: Goal: Risk for activity intolerance will decrease Outcome: Progressing   Problem: Coping: Goal: Level of anxiety will decrease Outcome: Progressing   Problem: Safety: Goal: Ability to remain free from injury will improve Outcome: Progressing

## 2024-08-25 ENCOUNTER — Ambulatory Visit: Admitting: Radiation Oncology

## 2024-08-25 ENCOUNTER — Ambulatory Visit

## 2024-08-25 DIAGNOSIS — C7931 Secondary malignant neoplasm of brain: Secondary | ICD-10-CM

## 2024-08-25 DIAGNOSIS — Z711 Person with feared health complaint in whom no diagnosis is made: Secondary | ICD-10-CM

## 2024-08-25 DIAGNOSIS — Z7189 Other specified counseling: Secondary | ICD-10-CM

## 2024-08-25 DIAGNOSIS — R451 Restlessness and agitation: Secondary | ICD-10-CM

## 2024-08-25 DIAGNOSIS — Z66 Do not resuscitate: Secondary | ICD-10-CM

## 2024-08-25 DIAGNOSIS — R0602 Shortness of breath: Secondary | ICD-10-CM

## 2024-08-25 LAB — COMPREHENSIVE METABOLIC PANEL WITH GFR
ALT: 12 U/L (ref 0–44)
AST: 36 U/L (ref 15–41)
Albumin: 2.5 g/dL — ABNORMAL LOW (ref 3.5–5.0)
Alkaline Phosphatase: 142 U/L — ABNORMAL HIGH (ref 38–126)
Anion gap: 11 (ref 5–15)
BUN: 81 mg/dL — ABNORMAL HIGH (ref 8–23)
CO2: 25 mmol/L (ref 22–32)
Calcium: 9.5 mg/dL (ref 8.9–10.3)
Chloride: 107 mmol/L (ref 98–111)
Creatinine, Ser: 1.46 mg/dL — ABNORMAL HIGH (ref 0.61–1.24)
GFR, Estimated: 50 mL/min — ABNORMAL LOW
Glucose, Bld: 105 mg/dL — ABNORMAL HIGH (ref 70–99)
Potassium: 5.5 mmol/L — ABNORMAL HIGH (ref 3.5–5.1)
Sodium: 143 mmol/L (ref 135–145)
Total Bilirubin: 0.5 mg/dL (ref 0.0–1.2)
Total Protein: 6.3 g/dL — ABNORMAL LOW (ref 6.5–8.1)

## 2024-08-25 LAB — CBC WITH DIFFERENTIAL/PLATELET
Abs Immature Granulocytes: 0.54 10*3/uL — ABNORMAL HIGH (ref 0.00–0.07)
Basophils Absolute: 0.1 10*3/uL (ref 0.0–0.1)
Basophils Relative: 0 %
Eosinophils Absolute: 0 10*3/uL (ref 0.0–0.5)
Eosinophils Relative: 0 %
HCT: 27.2 % — ABNORMAL LOW (ref 39.0–52.0)
Hemoglobin: 8.3 g/dL — ABNORMAL LOW (ref 13.0–17.0)
Immature Granulocytes: 2 %
Lymphocytes Relative: 1 %
Lymphs Abs: 0.4 10*3/uL — ABNORMAL LOW (ref 0.7–4.0)
MCH: 29.5 pg (ref 26.0–34.0)
MCHC: 30.5 g/dL (ref 30.0–36.0)
MCV: 96.8 fL (ref 80.0–100.0)
Monocytes Absolute: 1.5 10*3/uL — ABNORMAL HIGH (ref 0.1–1.0)
Monocytes Relative: 5 %
Neutro Abs: 28.3 10*3/uL — ABNORMAL HIGH (ref 1.7–7.7)
Neutrophils Relative %: 92 %
Platelets: 174 10*3/uL (ref 150–400)
RBC: 2.81 MIL/uL — ABNORMAL LOW (ref 4.22–5.81)
RDW: 17.6 % — ABNORMAL HIGH (ref 11.5–15.5)
Smear Review: NORMAL
WBC: 30.9 10*3/uL — ABNORMAL HIGH (ref 4.0–10.5)
nRBC: 0 % (ref 0.0–0.2)

## 2024-08-25 MED ORDER — HYDROMORPHONE HCL 1 MG/ML IJ SOLN
0.5000 mg | INTRAMUSCULAR | Status: DC | PRN
  Administered 2024-08-25: 1 mg via INTRAVENOUS
  Administered 2024-08-26: 2 mg via INTRAVENOUS
  Administered 2024-08-26: 0.5 mg via INTRAVENOUS
  Filled 2024-08-25: qty 1
  Filled 2024-08-25: qty 2
  Filled 2024-08-25: qty 1

## 2024-08-25 MED ORDER — HALOPERIDOL LACTATE 5 MG/ML IJ SOLN: 1.0000 mg | INTRAMUSCULAR | Status: DC | PRN

## 2024-08-25 MED ORDER — OXYCODONE HCL 5 MG PO TABS: 5.0000 mg | ORAL_TABLET | ORAL | Status: DC | PRN

## 2024-08-25 MED ORDER — SODIUM ZIRCONIUM CYCLOSILICATE 10 G PO PACK
10.0000 g | PACK | Freq: Three times a day (TID) | ORAL | Status: DC
  Filled 2024-08-25: qty 1

## 2024-08-25 MED ORDER — BISACODYL 10 MG RE SUPP: 10.0000 mg | Freq: Every day | RECTAL | Status: DC | PRN

## 2024-08-25 MED ORDER — GLYCOPYRROLATE 0.2 MG/ML IJ SOLN: 0.2000 mg | INTRAMUSCULAR | Status: DC | PRN

## 2024-08-25 MED ORDER — POLYVINYL ALCOHOL 1.4 % OP SOLN: 1.0000 [drp] | Freq: Four times a day (QID) | OPHTHALMIC | Status: DC | PRN

## 2024-08-25 MED ORDER — BIOTENE DRY MOUTH MT LIQD: 15.0000 mL | OROMUCOSAL | Status: DC | PRN

## 2024-08-25 MED ORDER — LORAZEPAM 2 MG/ML IJ SOLN
0.5000 mg | INTRAMUSCULAR | Status: DC | PRN
  Administered 2024-08-26: 1 mg via INTRAVENOUS
  Filled 2024-08-25: qty 1

## 2024-08-25 NOTE — Progress Notes (Signed)
" °   08/25/24 1605  TOC Brief Assessment  Insurance and Status Reviewed  Patient has primary care physician Yes  Home environment has been reviewed From home now comfort  Prior level of function: Independent now end of life comfort  Prior/Current Home Services No current home services  Social Drivers of Health Review SDOH reviewed no interventions necessary  Readmission risk has been reviewed Yes  Transition of care needs transition of care needs identified, TOC will continue to follow (following for [facility] Placement)    "

## 2024-08-25 NOTE — Progress Notes (Signed)
 " Daily Progress Note   Patient Name: Jeffrey Norton       Date: 08/25/2024 DOB: [birth year]  Age: 74 y.o. MRN#: [id number] Attending Physician: Leotis Bogus, MD Primary Care Physician: Dayna Motto, DO Admit Date: 08/12/2024 Length of Stay: 13 days  Reason for Follow-up: Establishing goals of care  Subjective:   CC: Patient with increased work of breathing asking for help.  Following up regarding goals of care.  Reviewed oncology documentation from today.  Oncologist acknowledged patient has continued to decline.  Patient not eating much and more lethargic.  Oncologist removed fentanyl  patch to make sure this was not contributing to his lethargy. At time of EMR review, patient had received as needed oxycodone  10 mg x 1 dose.  Oncologist had discontinued disorder and reordered for patient to receive as needed oxycodone  5 mg as needed. Discussed care with RN and hospitalist for medical updates.  RN and hospitalist both expressed concern that patient is not doing well at this time.  RN noted deterioration from having patient yesterday.  Presented to bedside to see patient.  Able to meet other family members at bedside including patient's wife, son, and daughter to discuss goals of care.  During visit patient noted to have signs of severe pain from headache and increased work of breathing causing patient to whisper help. *Please see IPAL note for detailed ACP discussion.  Result of ACP discussion was that patient will now be transitioned to comfort focused care at this time.  Referral being made for inpatient hospice evaluation at [facility].*  Discussed care with care team including hospitalist, TOC, ACC liaison, RN, and oncologist to coordinate care and transition to comfort focused care at this time.  ACC liaison later noted patient is eligible for [facility] though bed unavailable at this time.  Will continue comfort focused measures here in the hospital while awaiting bed at [facility] versus anticipated death in hospital.  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  Patient receiving radiation to assist with symptom management.  Oncology following and noting patient is not a candidate for systemic therapies.  Palliative medicine team following along to assist with goals of care and symptom management.  Recommendations/Plan: # Complex medical decision making/goals of care:  - Patient unable to engage in conversation today due to medical status.  -Discussed care with patient's wife, daughter, and son present at bedside as detailed above in HPI.  Patient has continued to medically deteriorate.  Discussed concerns related to this and medical care moving forward.  After discussion family agreeing with transition to comfort focused care at this time recognizing patient is reaching end-of-life.  Referral made to [facility] hospice for inpatient hospice evaluation at [facility].  TOC  assisting with coordination of care.  Noted palliative medicine team will continue to follow along with patient's medical journey.  -At this time we will discontinue interventions that are no longer focused on comfort such as IV fluids, imaging, or lab work.  Will instead focus on  symptom management of pain, dyspnea, and agitation in the setting of end-of-life care.    Code Status: Do not attempt resuscitation (DNR) - Comfort care  # Symptom management Patient is receiving these palliative interventions for symptom management with an intent to improve quality of life.     -Pain/Dyspnea, severe and acute in the setting of metastatic carcinoma with disease to bone and brain, now at end-of-life                               -Start IV Dilaudid  0.5-2 mg IV every 30 minutes as needed.  Continue to adjust based on patient's symptom burden.  If patient needing frequent dosing, may need to consider continuous infusion.                  -Anxiety/agitation, in the setting of end-of-life care with known underlying metastatic disease to brain                               -Start IV Ativan  0.5-1 mg every 4 hours as needed. Continue to adjust based on patient's symptom burden.                                 -Start IV Haldol  0.5 mg every 4 hours as needed. Continue to adjust based on patient's symptom burden.                   - Respiratory secretions, in the setting of end-of-life care                               -Start IV glycopyrrolate  0.2 mg every 4 hours as needed.  # Psychosocial Support:  - Wife, daughters, son  # Discharge Planning: Hospice facility versus anticipated hospital death  Discussed with: Patient's wife and daughter and son at bedside, [facility], hospitalist, RN, oncologist, [facility] liaison  Thank you for allowing the palliative care team to participate in the care Jerel JONETTA Feathers.  Tinnie Radar, DO Palliative Care Provider PMT # [phone]  If patient remains symptomatic despite maximum doses, please call PMT at [phone] between 0700 and 1900. Outside of these hours, please call attending, as PMT does not have night coverage.  Billing based on MDM: High  Problems Addressed: One acute or chronic illness or injury that poses a threat to life or bodily  function  Risks: Parenteral controlled substances  "

## 2024-08-25 NOTE — IPAL (Signed)
 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 Individuals present for the conversation: Patient unable to participate in complex medical decision making due to current medical status.  This provider discussed care with patient's wife and son and daughter at bedside.  Summary of the conversation:  Presented to bedside to see patient.  Able to meet other family members at bedside including patient's son and another daughter.  Patient's wife was also present at bedside.  Introduced myself as a member of the palliative medicine team.  When observing patient in the bed, he has increased work of breathing noted and appears very uncomfortable.  Wife acknowledged that patient has not had a good morning and things have not been going well.  She discussed how patient has continued to remain lethargic and is not really eating.  During our conversation patient even expressed help that was barely audible.  Able to ask RN to provide patient with as needed IV Dilaudid  for his increased work of breathing and pain.  With permission able to discuss continued deterioration with family present.  Expressed concern that patient's time is growing shorter and that aggressive medical interventions are no longer bring him quality of life with this underlying cancer.  Discussed the idea of transitioning patient to comfort focused care at this time with patient's continued deterioration.  Spent time explaining comfort focused care would entail discontinuing interventions such as IV fluids, lab work, and imaging to instead focus on comfort with management of symptoms such as pain, shortness of breath, and agitation.  Wife and family agreeing with transition to comfort focused care at this  time.   Had previously discussed idea of hospice with family.  They would want patient evaluated for inpatient hospice here in [place].  Noted would reach out to [facility] liaison for evaluation for [facility].  Did discuss continued comfort focused care while patient is here in the hospital and acknowledged that if patient was unable to go to [facility], would anticipate hospital death.  Spent time providing emotional support via active listening.  Chaplain also presented to bedside to provide spiritual support.  Outcome of the conversations and/or documents completed:  Transition to full comfort focused care at this time.  Referral for inpatient hospice at [facility] versus anticipated hospital death.  I spent 30 minutes providing separately identifiable ACP services with the patient and/or surrogate decision maker in a voluntary, in-person conversation discussing the patient's wishes and goals as detailed in the above note.  Tinnie Radar, DO Palliative Medicine Provider  -------------------------------------------------------------------------------------------------------------

## 2024-08-25 NOTE — Progress Notes (Signed)
 I followed up with Neale's family after initial visit from Chaplain Amy.  They are gathering around him and showing him care and love.  They have a family member who is a programmer, multimedia and he spent time providing support with family just prior to my visit.  They are okay for now, but please page as needs arise.  [address] Bcc Pager, [phone]

## 2024-08-25 NOTE — Progress Notes (Signed)
 [id number] [facility] Liaison Note  Received request from Care Manager for family interest in [facility]. Chart reviewed  and spoke with family to acknowledge referral.  Hospice eligibility is confirmed.  Unfortunately [facility] is not able to offer a room today.  Family and care manager aware hospital liaison will follow up tomorrow or sooner if room becomes available.  Please do not hesitate to call with questions or concerns.  Thank you Inocente Jacobs RN BSN [facility] Liaison [phone]

## 2024-08-25 NOTE — Progress Notes (Signed)
 Overall, I really do not see Jeffrey Norton really improving.  I know that he is trying.  I just think that we might be looking at a decline.  I told his wife that we will have a much better idea as to where things are going after this weekend.  He is getting radiation treatments.  He had no problems with pain after the last treatment..  I did put a Duragesic  patch on him.  I do not know if this might be affecting his cognition.  We will take the patch off.  He is not eating much.  According to his wife, he slept most of the day yesterday.  Again, I does have a sense that his body may not be able to withstand the cancer.  Again, I really think that after this weekend, we will have a better idea as to what is going to happen with him.  I think if we do not see any improvement over the weekend, we just may think about stopping radiation therapy.  I just do not think that he would really benefit from radiation if his decline continues.  I do not see any labs back yet today.   Vital signs are stable.  Temperature 97.5.  Pulse 101.  Blood pressure 134/74.  His lungs sound clear on the left side.  Right side sounds decreased.  He may have a little bit of congestion.  Cardiac exam slightly tachycardic but regular.  Abdomen is soft.  Bowel sounds are slightly decreased.  There is no fluid wave.  Extremities shows muscle atrophy in upper and lower extremities.  Neurological exam shows some overall lethargy.   Again, I think that we might be looking at a decline for Jeffrey Norton.  Again I think this weekend we will really have a better idea as to where we are headed.  I talked to his wife about this.  She understands that we might be looking at a very limited lifespan right now.  I think that if things do not get better over the weekend, we probably need to think about moving him over to [facility] or if he really declines just keep him in the hospital until he passes.  I just hate this.  I know he is  trying.  I know his wife is really trying her best to help him.  I know the staff up on [address] are doing a great job.   Jeralyn Crease, MD  [place] 6:2

## 2024-08-25 NOTE — Plan of Care (Signed)
 Pt now comfort care.   Problem: Education: Goal: Knowledge of General Education information will improve Description: Including pain rating scale, medication(s)/side effects and non-pharmacologic comfort measures Outcome: Not Met (add Reason)   Problem: Health Behavior/Discharge Planning: Goal: Ability to manage health-related needs will improve Outcome: Not Met (add Reason)   Problem: Clinical Measurements: Goal: Ability to maintain clinical measurements within normal limits will improve Outcome: Not Met (add Reason) Goal: Will remain free from infection Outcome: Not Met (add Reason) Goal: Diagnostic test results will improve Outcome: Not Met (add Reason) Goal: Respiratory complications will improve Outcome: Not Met (add Reason) Goal: Cardiovascular complication will be avoided Outcome: Not Met (add Reason)   Problem: Activity: Goal: Risk for activity intolerance will decrease Outcome: Not Met (add Reason)   Problem: Nutrition: Goal: Adequate nutrition will be maintained Outcome: Not Met (add Reason)   Problem: Coping: Goal: Level of anxiety will decrease Outcome: Not Met (add Reason)   Problem: Elimination: Goal: Will not experience complications related to bowel motility Outcome: Not Met (add Reason) Goal: Will not experience complications related to urinary retention Outcome: Not Met (add Reason)   Problem: Pain Managment: Goal: General experience of comfort will improve and/or be controlled Outcome: Not Met (add Reason)   Problem: Safety: Goal: Ability to remain free from injury will improve Outcome: Not Met (add Reason)   Problem: Skin Integrity: Goal: Risk for impaired skin integrity will decrease Outcome: Not Met (add Reason)   Problem: Education: Goal: Knowledge of the prescribed therapeutic regimen will improve Outcome: Not Met (add Reason)   Problem: Coping: Goal: Ability to identify and develop effective coping behavior will improve Outcome: Not  Met (add Reason)   Problem: Clinical Measurements: Goal: Quality of life will improve Outcome: Not Met (add Reason)   Problem: Respiratory: Goal: Verbalizations of increased ease of respirations will increase Outcome: Not Met (add Reason)   Problem: Role Relationship: Goal: Family's ability to cope with current situation will improve Outcome: Not Met (add Reason) Goal: Ability to verbalize concerns, feelings, and thoughts to partner or family member will improve Outcome: Not Met (add Reason)   Problem: Pain Management: Goal: Satisfaction with pain management regimen will improve Outcome: Not Met (add Reason)

## 2024-08-25 NOTE — Plan of Care (Signed)
   Problem: Coping: Goal: Level of anxiety will decrease Outcome: Progressing   Problem: Elimination: Goal: Will not experience complications related to urinary retention Outcome: Progressing   Problem: Pain Managment: Goal: General experience of comfort will improve and/or be controlled Outcome: Progressing

## 2024-08-25 NOTE — Plan of Care (Signed)
  Problem: Education: Goal: Knowledge of General Education information will improve Description: Including pain rating scale, medication(s)/side effects and non-pharmacologic comfort measures Outcome: Progressing   Problem: Health Behavior/Discharge Planning: Goal: Ability to manage health-related needs will improve Outcome: Not Progressing   Problem: Clinical Measurements: Goal: Ability to maintain clinical measurements within normal limits will improve Outcome: Not Progressing Goal: Diagnostic test results will improve Outcome: Not Progressing

## 2024-08-25 NOTE — TOC Initial Note (Signed)
 Transition of Care [facility]) - Initial/Assessment Note    Patient Details  Name: Jeffrey Norton [name] Date of Birth: 1951-03-25  Transition of Care [facility]) CM/SW Contact:    Toy LITTIE Agar, RN Phone Number:[phone]  08/25/2024, 4:06 PM  Clinical Narrative:                 Inpatient care manager following for [facility]. Full comfort. No beds available today.     Barriers to Discharge: Hospice Bed not available   Patient Goals and CMS Choice            Expected Discharge Plan and Services       Living arrangements for the past 2 months: Single Family Home                                      Prior Living Arrangements/Services Living arrangements for the past 2 months: Single Family Home Lives with:: Spouse                   Activities of Daily Living   ADL Screening (condition at time of admission) Independently performs ADLs?: Yes (appropriate for developmental age) Is the patient deaf or have difficulty hearing?: No Does the patient have difficulty seeing, even when wearing glasses/contacts?: No Does the patient have difficulty concentrating, remembering, or making decisions?: No  Permission Sought/Granted                  Emotional Assessment              Admission diagnosis:  Metastasis to brain (HCC) [C79.31] Brain lesion [G93.9] Altered mental status, unspecified altered mental status type [R41.82] Patient Active Problem List   Diagnosis Date Noted   Concern about end of life 08/25/2024   DNR (do not resuscitate) 08/25/2024   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) 08/25/2024   ACP (advance care planning) 08/25/2024   Shortness of breath 08/25/2024   Agitation 08/25/2024   Palliative care encounter 08/24/2024   Goals of care, counseling/discussion 08/24/2024   Need for emotional support 08/24/2024   Counseling and coordination of care 08/24/2024   Cancer associated pain 08/24/2024   High risk medication use  08/24/2024   Medication management 08/24/2024   Metastatic primary lung cancer (HCC) 08/13/2024   Sepsis (HCC) 08/13/2024   Elevated troponin 08/13/2024   Metastasis to brain (HCC) 08/12/2024   AKI (acute kidney injury) 03/05/2023   CHF exacerbation (HCC) 03/04/2023   Acute on chronic systolic heart failure (HCC) 03/04/2023   CKD (chronic kidney disease) stage 3, GFR 30-59 ml/min (HCC) 03/04/2023   Unstable angina (HCC) 02/22/2023   ACS (acute coronary syndrome) (HCC) 02/22/2023   Atrial flutter (HCC) 07/08/2022   HFrEF (heart failure with reduced ejection fraction) (HCC) 07/08/2022   Atrial fibrillation (HCC) 07/07/2022   Non-ST elevation (NSTEMI) myocardial infarction (HCC) 07/07/2022   NSTEMI (non-ST elevated myocardial infarction) (HCC) 07/07/2022   Hyperlipidemia 04/25/2013   Claudication of lower extremity 08/15/2012   HTN (hypertension) 08/15/2012   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  [place] [address] Phone: [phone] Fax: [phone]  [facility] [address] [place] [address] Phone: [phone] Fax: [phone]     Social Drivers of Health (SDOH) Social History: SDOH Screenings   Food Insecurity: No Food Insecurity (08/14/2024)  Housing: Low Risk (08/14/2024)  Transportation Needs: No Transportation Needs (08/14/2024)  Utilities: Not At Risk (08/14/2024)  Social Connections: Moderately Isolated (08/14/2024)  Tobacco Use: Medium Risk (08/13/2024)   SDOH Interventions: Social Connections Interventions: Inpatient TOC, Intervention Not Indicated   Readmission Risk Interventions      No data to display

## 2024-08-25 NOTE — Progress Notes (Signed)
" °   08/25/24 1224  Spiritual Encounters  Type of Visit Initial  Care provided to: [facility] partners present during encounter Physician  Referral source Nurse (RN/NT/LPN)  Reason for visit End-of-life  OnCall Visit No   I responded to a request for support by Glendell Saba, RN as Mr Cailen Mihalik moves toward EOL. At time of visit, Spencer Radar, DO, was debriefing with family on care plan and recommendation for in-house hospice care.  I provided compassionate presence and active listening to spouse, Heron, and three of their adult children, Glendia Domino, and Lauren. Kathlene engaged in meaning making, reflecting significantly on ways that Addie has felt spiritual presence and how this now has more meaning and brings some comfort. She shared that they are a family of Christian faith.  [facility] representative arrived and one additional of their children, so I left family but shared plan for ongoing support. Shared status with [facility] as well if there are imminent needs.  Please page chaplain as needed, will continue to follow. Roshad Hack L. Delores HERO.Div "

## 2024-08-26 ENCOUNTER — Other Ambulatory Visit

## 2024-08-26 DIAGNOSIS — C7931 Secondary malignant neoplasm of brain: Secondary | ICD-10-CM | POA: Diagnosis not present

## 2024-08-26 MED ORDER — LORAZEPAM 2 MG/ML IJ SOLN: 2.0000 mg | INTRAMUSCULAR | Status: DC | PRN

## 2024-08-26 MED ORDER — ORAL CARE MOUTH RINSE: 15.0000 mL | OROMUCOSAL | Status: DC | PRN

## 2024-08-27 NOTE — Progress Notes (Signed)
 " Daily Progress Note   Patient Name: Jeffrey Norton       Date: 09/08/2024 DOB: March 08, 1951  Age: 74 y.o. MRN#: [id number] Attending Physician: Leotis Bogus, MD Primary Care Physician: Dayna Motto, DO Admit Date: 08/12/2024 Length of Stay: 14 days  Reason for Follow-up: Establishing goals of care and Terminal Care  Subjective:   CC: Patient laying in bed with increased work of breathing and grimacing at times.  Following up regarding symptom management.  Reviewed oncologist documentation from today.  Oncologist continuing to support family while focusing on comfort.  At time of EMR review on past 24 hours patient has received as needed IV Dilaudid  1 mg x 2 doses and 0.5 mg x 1 dose.  Patient has also received as needed IV Ativan  1 mg x 1 dose.  Presented to bedside to see patient.  Wife present at bedside.  Wife discussed patient had recently received Ativan  due to severe agitation.  When observing patient he has increased work of breathing and appears to be grimacing at times.  Spent time educating that agitation can sometimes be caused from pain.  Noted would ask nurse to provide pain medications at this time.  Did inquire how the IV Ativan  affected patient and though wife noted it did not make much improvement so noted could adjust dosing.  Spent time providing emotional support via active listening.  Noted palliative medicine team continue to follow along with patient's medical journey.  Discussed with RN to coordinate care.  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  Patient receiving radiation to assist with symptom management.  Oncology following and noting patient is not a candidate for systemic therapies.  Palliative medicine team following along to assist with goals of care and symptom management.  Recommendations/Plan: # Complex medical decision making/goals of care:  - Patient unable to engage in conversation today due to medical status.  - Had discussed care with all family including wife and patient was transition to full comfort focused care on 08/25/2024.  Referral already made to [facility] hospice for inpatient hospice evaluation at [facility] though no beds were available on 08/25/2024.  TOC assisting with coordination of care.  Continuing comfort focused care at this time.  Palliative medicine team will continue to follow along with patient's medical journey.  - Have discontinued interventions that are no longer focused on comfort such as IV fluids, imaging, or lab work.  For Singh on symptom management of pain, dyspnea, and agitation in the setting of end-of-life care.    Code Status: Do not attempt resuscitation (DNR) - Comfort care  # Symptom management Patient is receiving these palliative interventions for symptom management with an intent to improve quality of  life.     -Pain/Dyspnea, severe and acute in the setting of metastatic carcinoma with disease to bone and brain, now at end-of-life                               - Continue IV Dilaudid  0.5-2 mg IV every 30 minutes as needed.  Continue to adjust based on patient's symptom burden.  If patient needing frequent dosing, may need to consider continuous infusion.                   -Anxiety/agitation, severe in the setting of end-of-life care with known underlying metastatic disease to brain                               - Increase IV Ativan  to 2 mg every 4 hours as needed. Continue to adjust based on patient's symptom burden.                                 - Continue IV Haldol  0.5 mg every 4 hours as needed. Continue to adjust based on patient's symptom burden.                   - Respiratory secretions, in the setting of end-of-life care                               - Continue IV glycopyrrolate  0.2 mg every 4 hours as needed.  # Psychosocial Support:  - Wife, daughters, son  # Discharge Planning: Hospice facility versus anticipated hospital death  Discussed with: Patient's wife, RN  Thank you for allowing the palliative care team to participate in the care Jeffrey Norton.  Tinnie Radar, DO Palliative Care Provider PMT # [phone]  If patient remains symptomatic despite maximum doses, please call PMT at [phone] between 0700 and 1900. Outside of these hours, please call attending, as PMT does not have night coverage.  Billing based on MDM: High  Problems Addressed: One or more chronic illnesses with severe exacerbation, progression, or side effects of treatment.  Risks: Parenteral controlled substances   "

## 2024-08-27 DEATH — deceased

## 2024-08-28 ENCOUNTER — Ambulatory Visit: Admitting: Radiation Oncology

## 2024-08-28 ENCOUNTER — Ambulatory Visit

## 2024-08-29 ENCOUNTER — Other Ambulatory Visit: Payer: Self-pay | Admitting: Cardiovascular Disease

## 2024-08-29 ENCOUNTER — Ambulatory Visit

## 2024-08-29 ENCOUNTER — Encounter: Payer: Self-pay | Admitting: *Deleted

## 2024-08-29 ENCOUNTER — Ambulatory Visit: Admitting: Radiation Oncology

## 2024-08-29 NOTE — Progress Notes (Signed)
 Oncology Nurse Navigator Documentation     08/29/2024    8:15 AM  Oncology Nurse Navigator Flowsheets  Navigation Complete Date: 08/29/2024  Post Navigation: Continue to Follow Patient? No  Reason Not Navigating Patient: Hospice/Death  Navigator Location [facility]  Time Spent with Patient 15

## 2024-08-30 ENCOUNTER — Ambulatory Visit

## 2024-08-30 ENCOUNTER — Ambulatory Visit: Admitting: Radiation Oncology

## 2024-08-31 ENCOUNTER — Ambulatory Visit: Admitting: Radiation Oncology

## 2024-08-31 ENCOUNTER — Ambulatory Visit

## 2024-09-01 ENCOUNTER — Ambulatory Visit

## 2024-10-02 ENCOUNTER — Ambulatory Visit

## 2024-11-02 ENCOUNTER — Ambulatory Visit (HOSPITAL_COMMUNITY)

## 2024-11-13 ENCOUNTER — Ambulatory Visit: Admitting: Cardiovascular Disease
